# Patient Record
Sex: Female | Born: 1940 | Race: White | Hispanic: No | Smoking: Former smoker
Health system: Southern US, Community
[De-identification: ages and names within clinical notes are randomized; demographics above are authoritative.]

## PROBLEM LIST (undated history)

## (undated) DIAGNOSIS — E079 Disorder of thyroid, unspecified: Secondary | ICD-10-CM

## (undated) DIAGNOSIS — D510 Vitamin B12 deficiency anemia due to intrinsic factor deficiency: Secondary | ICD-10-CM

## (undated) DIAGNOSIS — Z860100 Personal history of colon polyps, unspecified: Secondary | ICD-10-CM

## (undated) DIAGNOSIS — E039 Hypothyroidism, unspecified: Secondary | ICD-10-CM

## (undated) DIAGNOSIS — F419 Anxiety disorder, unspecified: Secondary | ICD-10-CM

## (undated) DIAGNOSIS — E785 Hyperlipidemia, unspecified: Secondary | ICD-10-CM

## (undated) DIAGNOSIS — H9313 Tinnitus, bilateral: Secondary | ICD-10-CM

## (undated) DIAGNOSIS — M1990 Unspecified osteoarthritis, unspecified site: Secondary | ICD-10-CM

## (undated) DIAGNOSIS — I10 Essential (primary) hypertension: Secondary | ICD-10-CM

## (undated) DIAGNOSIS — Z9889 Other specified postprocedural states: Secondary | ICD-10-CM

## (undated) DIAGNOSIS — Z87891 Personal history of nicotine dependence: Secondary | ICD-10-CM

## (undated) HISTORY — DX: Anxiety disorder, unspecified: F41.9

## (undated) HISTORY — DX: Unspecified osteoarthritis, unspecified site: M19.90

## (undated) HISTORY — PX: APPENDECTOMY: SHX54

## (undated) HISTORY — DX: Hyperlipidemia, unspecified: E78.5

## (undated) HISTORY — DX: Vitamin B12 deficiency anemia due to intrinsic factor deficiency: D51.0

## (undated) HISTORY — DX: Tinnitus, bilateral: H93.13

## (undated) HISTORY — PX: COLONOSCOPY: SHX174

## (undated) HISTORY — DX: Essential (primary) hypertension: I10

## (undated) HISTORY — DX: Personal history of colon polyps, unspecified: Z86.0100

## (undated) HISTORY — DX: Personal history of nicotine dependence: Z87.891

## (undated) HISTORY — PX: ENTEROCELE REPAIR: SHX623

## (undated) HISTORY — DX: Personal history of colonic polyps: Z86.010

## (undated) HISTORY — DX: Disorder of thyroid, unspecified: E07.9

## (undated) HISTORY — DX: Hypothyroidism, unspecified: E03.9

## (undated) HISTORY — PX: ABDOMINAL HYSTERECTOMY: SHX81

## (undated) HISTORY — PX: POLYPECTOMY: SHX149

## (undated) HISTORY — PX: BREAST SURGERY: SHX581

## (undated) HISTORY — DX: Other specified postprocedural states: Z98.890

## (undated) HISTORY — PX: EXCISIONAL HEMORRHOIDECTOMY: SHX1541

---

## 1998-05-30 ENCOUNTER — Other Ambulatory Visit: Admission: RE | Admit: 1998-05-30 | Discharge: 1998-05-30 | Payer: Self-pay | Admitting: Gynecology

## 1999-04-25 ENCOUNTER — Other Ambulatory Visit: Admission: RE | Admit: 1999-04-25 | Discharge: 1999-04-25 | Payer: Self-pay | Admitting: Gynecology

## 2000-10-22 ENCOUNTER — Encounter: Admission: RE | Admit: 2000-10-22 | Discharge: 2000-10-22 | Payer: Self-pay | Admitting: Gynecology

## 2000-10-22 ENCOUNTER — Encounter: Payer: Self-pay | Admitting: Gynecology

## 2000-11-03 ENCOUNTER — Ambulatory Visit (HOSPITAL_COMMUNITY): Admission: RE | Admit: 2000-11-03 | Discharge: 2000-11-03 | Payer: Self-pay | Admitting: Gastroenterology

## 2000-11-03 ENCOUNTER — Encounter (INDEPENDENT_AMBULATORY_CARE_PROVIDER_SITE_OTHER): Payer: Self-pay | Admitting: *Deleted

## 2001-10-21 ENCOUNTER — Other Ambulatory Visit: Admission: RE | Admit: 2001-10-21 | Discharge: 2001-10-21 | Payer: Self-pay | Admitting: Gynecology

## 2003-02-23 ENCOUNTER — Other Ambulatory Visit: Admission: RE | Admit: 2003-02-23 | Discharge: 2003-02-23 | Payer: Self-pay | Admitting: Gynecology

## 2003-10-17 ENCOUNTER — Encounter: Admission: RE | Admit: 2003-10-17 | Discharge: 2003-10-17 | Payer: Self-pay | Admitting: Gynecology

## 2004-02-23 ENCOUNTER — Other Ambulatory Visit: Admission: RE | Admit: 2004-02-23 | Discharge: 2004-02-23 | Payer: Self-pay | Admitting: Gynecology

## 2004-10-03 ENCOUNTER — Ambulatory Visit (HOSPITAL_COMMUNITY): Admission: RE | Admit: 2004-10-03 | Discharge: 2004-10-03 | Payer: Self-pay | Admitting: Gastroenterology

## 2004-10-03 ENCOUNTER — Encounter (INDEPENDENT_AMBULATORY_CARE_PROVIDER_SITE_OTHER): Payer: Self-pay | Admitting: Specialist

## 2005-05-02 ENCOUNTER — Encounter: Admission: RE | Admit: 2005-05-02 | Discharge: 2005-05-02 | Payer: Self-pay | Admitting: Gynecology

## 2005-05-30 ENCOUNTER — Other Ambulatory Visit: Admission: RE | Admit: 2005-05-30 | Discharge: 2005-05-30 | Payer: Self-pay | Admitting: Gynecology

## 2005-11-07 ENCOUNTER — Ambulatory Visit (HOSPITAL_COMMUNITY): Admission: RE | Admit: 2005-11-07 | Discharge: 2005-11-08 | Payer: Self-pay | Admitting: General Surgery

## 2005-11-07 ENCOUNTER — Encounter (INDEPENDENT_AMBULATORY_CARE_PROVIDER_SITE_OTHER): Payer: Self-pay | Admitting: *Deleted

## 2006-06-02 ENCOUNTER — Other Ambulatory Visit: Admission: RE | Admit: 2006-06-02 | Discharge: 2006-06-02 | Payer: Self-pay | Admitting: Gynecology

## 2006-06-02 ENCOUNTER — Encounter: Admission: RE | Admit: 2006-06-02 | Discharge: 2006-06-02 | Payer: Self-pay | Admitting: Gynecology

## 2007-06-15 ENCOUNTER — Other Ambulatory Visit: Admission: RE | Admit: 2007-06-15 | Discharge: 2007-06-15 | Payer: Self-pay | Admitting: Gynecology

## 2007-06-15 ENCOUNTER — Encounter: Admission: RE | Admit: 2007-06-15 | Discharge: 2007-06-15 | Payer: Self-pay | Admitting: Gynecology

## 2008-06-15 ENCOUNTER — Encounter: Admission: RE | Admit: 2008-06-15 | Discharge: 2008-06-15 | Payer: Self-pay | Admitting: Gynecology

## 2009-06-16 ENCOUNTER — Encounter: Admission: RE | Admit: 2009-06-16 | Discharge: 2009-06-16 | Payer: Self-pay | Admitting: Gynecology

## 2010-07-03 ENCOUNTER — Encounter: Admission: RE | Admit: 2010-07-03 | Discharge: 2010-07-03 | Payer: Self-pay | Admitting: Gynecology

## 2010-10-31 ENCOUNTER — Ambulatory Visit (HOSPITAL_COMMUNITY)
Admission: RE | Admit: 2010-10-31 | Discharge: 2010-10-31 | Disposition: A | Discharge status: Home / Self Care | Payer: Medicare Other | Source: Ambulatory Visit | Attending: Family Medicine | Admitting: Family Medicine

## 2010-10-31 ENCOUNTER — Other Ambulatory Visit (HOSPITAL_COMMUNITY): Payer: Self-pay | Admitting: Family Medicine

## 2010-10-31 ENCOUNTER — Other Ambulatory Visit: Payer: Self-pay | Admitting: Family Medicine

## 2010-10-31 ENCOUNTER — Observation Stay (HOSPITAL_COMMUNITY)
Admission: EM | Admit: 2010-10-31 | Discharge: 2010-11-01 | Disposition: A | Discharge status: Home / Self Care | Payer: Medicare Other | Attending: Surgery | Admitting: Surgery

## 2010-10-31 ENCOUNTER — Emergency Department (HOSPITAL_COMMUNITY): Payer: Medicare Other

## 2010-10-31 DIAGNOSIS — Z23 Encounter for immunization: Secondary | ICD-10-CM | POA: Insufficient documentation

## 2010-10-31 DIAGNOSIS — R52 Pain, unspecified: Secondary | ICD-10-CM

## 2010-10-31 DIAGNOSIS — E039 Hypothyroidism, unspecified: Secondary | ICD-10-CM | POA: Insufficient documentation

## 2010-10-31 DIAGNOSIS — R112 Nausea with vomiting, unspecified: Secondary | ICD-10-CM | POA: Insufficient documentation

## 2010-10-31 DIAGNOSIS — R109 Unspecified abdominal pain: Secondary | ICD-10-CM | POA: Insufficient documentation

## 2010-10-31 DIAGNOSIS — K3580 Unspecified acute appendicitis: Principal | ICD-10-CM | POA: Insufficient documentation

## 2010-10-31 DIAGNOSIS — R110 Nausea: Secondary | ICD-10-CM | POA: Insufficient documentation

## 2010-10-31 DIAGNOSIS — E7800 Pure hypercholesterolemia, unspecified: Secondary | ICD-10-CM | POA: Insufficient documentation

## 2010-10-31 LAB — DIFFERENTIAL
Basophils Relative: 0 % (ref 0–1)
Eosinophils Absolute: 0.1 10*3/uL (ref 0.0–0.7)
Lymphs Abs: 1.3 10*3/uL (ref 0.7–4.0)

## 2010-10-31 LAB — CREATININE, SERUM: GFR calc Af Amer: >60 mL/min (ref >60)

## 2010-10-31 LAB — PROTIME-INR: Prothrombin Time: 13 seconds (ref 11.6–15.2)

## 2010-10-31 LAB — CBC
HCT: 41.4 % (ref 36.0–46.0)
MCH: 30.9 pg (ref 26.0–34.0)
WBC: 11.6 10*3/uL — ABNORMAL HIGH (ref 4.0–10.5)

## 2010-10-31 LAB — POCT I-STAT, CHEM 8: Sodium: 136 mEq/L (ref 135–145)

## 2010-10-31 MED ORDER — IOHEXOL 300 MG/ML  SOLN
100.0000 mL | Freq: Once | INTRAMUSCULAR | Status: AC | PRN
  Administered 2010-10-31: 100 mL via INTRAVENOUS

## 2010-11-01 ENCOUNTER — Other Ambulatory Visit: Payer: Self-pay | Admitting: General Surgery

## 2010-11-01 ENCOUNTER — Ambulatory Visit (HOSPITAL_COMMUNITY)
Admission: RE | Admit: 2010-11-01 | Discharge: 2010-11-01 | Disposition: A | Discharge status: Home / Self Care | Payer: PRIVATE HEALTH INSURANCE | Source: Ambulatory Visit | Attending: Family Medicine | Admitting: Family Medicine

## 2010-11-12 NOTE — H&P (Addendum)
[name], [name]                 ACCOUNT NO.:  [id number]  MEDICAL RECORD NO.:  [id number]           PATIENT TYPE:  I  LOCATION:  3039                         FACILITY:  [facility]  PHYSICIAN:  Lennie Muckle, MD      DATE OF BIRTH:  29-Oct-1940  DATE OF ADMISSION:  10/31/2010 DATE OF DISCHARGE:                             HISTORY & PHYSICAL   PRIMARY CARE PHYSICIAN:  Dr. Clarene Duke at [facility].  CHIEF COMPLAINT:  Abdominal pain.  HISTORY:  This is a 70 year old female who had onset abdominal pain last night.  Pain is generalized over the abdomen.  She states that unrelenting pain began to worsen today.  She went to their [facility] where she was sent for imaging.  She states that she did have some mild chills, but no documented fevers, nausea, and one episode of emesis.  No diarrhea.  On CT scan, there was by report perforation.  She was sent to the emergency department for further management.  She states the pain is worse with deep palpation.  It is better with pain medications.  PAST MEDICAL HISTORY: 1. Hyperthyroidism. 2. Hypercholesterolemia.  SURGICAL HISTORY:  Hysterectomy, lumpectomy.  FAMILY HISTORY:  Negative.  SOCIAL HISTORY:  She is married, lives with her husband, has 1 child. No tobacco or alcohol use.  REVIEW OF SYSTEMS:  Negative.  MEDICATION:  Synthroid, simvastatin, multivitamin.  ALLERGIES:  No allergies.  PHYSICAL EXAMINATION:  CONSTITUTIONAL:  She is lying on a stretcher in no acute distress.  She appears her stated age.  Alert and  oriented x3. VITAL SIGNS:  Temperature is 97.1, heart rate 76, blood pressure 142/77. HEENT:  Head is normocephalic.  Extraocular muscles are intact.  Pupils are equal and round.  Sclarea and conjunctivae are clear.  Nares are clear without drainage.  Oral mucosa is pink and moist.  Dentition is good.  Trachea is midline. NECK:  Thyroid without palpable abnormalities.  Normal range of motion in the  neck. CHEST:  Clear to auscultation bilaterally.  Normal expansion and excursion. CARDIOVASCULAR:  Regular rate and rhythm.  No murmurs, gallops, or rubs. EXTREMITIES:  Pulses are palpated in the upper extremities.  No edema in the lower extremity. ABDOMEN:  Mildly distended, mildly tender to palpation.  No peritoneal signs.  No organomegaly.  No masses.  No hernias.  Bowel sounds are hypoactive. MUSCULOSKELETAL:  No pain on palpation in joints.  Normal range of motion. SKIN:  No rashes.  Normal skin turgor and color.  Skin is warm to touch. PSYCHOLOGIC:  Normal.  LABORATORY DATA:  CBC:  White count 11.6, hemoglobin and hematocrit 14 and 41.4, platelets 161.  BUN creatinine 10 and 0.9.  CT scans reviewed. There is a thickened appendix with some fluid noted around the appendix. I reviewed the imaging with Dr. Collins Scotland, Radiology.  Despite the fluid, there is not an organized fluid collection, nor is there air suggesting perforation in the vicinity.  ASSESSMENT AND PLAN:  Acute appendicitis with fluid within the pelvis. We will plan laparoscopic appendectomy with wash out of the abdomen. She received Zosyn in the  emergency department.  I have discussed risks of surgery including but not limited to bleeding, infection, abscess formation, possibility of ileus, and conversion to an open procedure. We are going to proceed to the operating room and then hopefully, she will recover without incident and be discharged either tomorrow or over the next few days.     Lennie Muckle, MD     ALA/MEDQ  D:  10/31/2010  T:  11/01/2010  Job:  [id number]  Electronically Signed by Bertram Savin MD on 11/12/2010 01:27:16 PM

## 2010-11-12 NOTE — Op Note (Addendum)
[name], [name]                 ACCOUNT NO.:  [id number]  MEDICAL RECORD NO.:  [id number]          PATIENT TYPE:  LOCATION:                                 FACILITY:  PHYSICIAN:  Lennie Muckle, MD           DATE OF BIRTH:  DATE OF PROCEDURE:  11/01/2010 DATE OF DISCHARGE:                              OPERATIVE REPORT   PREOPERATIVE DIAGNOSIS:  Acute appendicitis.  POSTOPERATIVE DIAGNOSIS:  Acute appendicitis.  PROCEDURE:  Laparoscopic appendectomy.  SURGEON:  Lennie Muckle, MD  ASSISTANT:  OR staff.  FINDINGS:  Acute appendicitis.  Small amount of fluid within the pelvis. No immediate complications.  SPECIMENS:  Gallbladder.  INDICATIONS FOR PROCEDURE:  [name] is a 70 year old female who came in with a chief complaint of abdominal pain.  CT findings revealed acute appendicitis.  She received Zosyn in the emergency department.  DETAILS OF PROCEDURE:  [name] was taken from the emergency department to the preoperative holding area, seen by Anesthesia and then taken to the operating suite.  Once the operating suite, placed in supine position.  After administration of general endotracheal anesthesia, sequential compression devices applied to her lower extremity.  Abdomen was prepped and draped in usual sterile fashion.  Surgical time-out performed.  I began by placing a 5-mm trocar using the OptiVu into the abdominal cavity on the right side of the abdomen.  All layers of the abdominal wall were visualized upon entry.  I inspected the abdomen and found no evidence of any replacement of the trocar.  I then placed an additional 5-mm trocar at the umbilical region under visualization with camera.  The patient was placed right side upper and turned over position.  An 11-mm trocar was placed in the left lower quadrant under visualization with camera.  The appendix was visualized within the pelvis.  The cecum was somewhat down into the pelvis.  I was able to grasp the  appendix and retracted the abdominal wall.  There was small amount of purulent fluid within the pelvis.  There was no evidence of perforation.  I dissected the base of the appendix, stapled across the mesoappendix without difficulty.  I did mobilize the cecum laterally. This enabled me to get a laparoscopic load at the GIA stapler across the base of the appendix.  I then irrigated the abdomen.  There was small amount of bleeding from the mesoappendix.  I used a clip applier to control this.  I then continued irrigating the abdomen until the irrigant was clear.  There was no further bleeding from the staple line. The appendix was removed and an EndoCatch bag from the abdomen.  I did one more inspection of the abdomen and found the staple line intact and no bleeding.  I then removed and closed the fascial defect using a 0 Vicryl suture.  A 20 mL of 0.25% Marcaine were placed for local block.  The patient had skin closed with 4-0 Monocryl. Dermabond was placed for final dressing.  The patient was then extubated and transported to the Postanesthesia Care Unit in stable condition.  Lennie Muckle, MD     ALA/MEDQ  D:  11/01/2010  T:  11/01/2010  Job:  [id number]  cc:   Caryn Bee L. Little, M.D.  Electronically Signed by Bertram Savin MD on 11/12/2010 01:27:19 PM

## 2011-01-18 NOTE — Procedures (Signed)
[facility]  Patient:    [name], [name]                        MRN: [id number] Proc. Date: 11/03/00 Adm. Date:  [id number] Attending:  Rich Brave CC:         Gretta Cool, M.D.   Procedure Report  PROCEDURE:  Colonoscopy with polypectomy and biopsies.  INDICATION:  A 70 year old with possible polyp on digital exam.  FINDINGS:  Hypertrophied anal papilla at the dentate line, presumably corresponding to the palpable nodule in the rectum.  Three small polyps removed from the proximal colon.  DESCRIPTION OF PROCEDURE:  The nature, purpose, and risks of the procedure have been discussed with the patient, who provided written consent.  Sedation was fentanyl 40 mcg and Versed 4 mg IV without arrhythmias or desaturation. The Olympus pediatric adjustable-tension video colonoscope was advanced to the cecum without difficulty, as identified by clear visualization of the appendiceal orifice, and pullback was then performed.  The quality of the prep was excellent, and it is felt that all areas were well-seen.  There were three small polyps identified and removed during todays exam. There was a 4-5 mm semipedunculated sessile polyp near the hepatic flexure that was removed by snare technique with complete hemostasis and no evidence of excessive cautery.  It was able to suction through the scope with initial resistance.  It was retrieved for histologic analysis.  Distal to this, in the proximal transverse region, I encountered two other polyps.  One was a 2 x 4 mm sessile polyp on a fold, removed by three cold biopsies, and the other was a 2 mm sessile polyp removed by a single cold biopsy.  No large polyps, cancer, colitis, vascular malformations, or diverticulosis were observed.  Retroflexion in the rectum showed a prominent hypertrophied anal papilla, also observed during pullout through the anal canal.  The patient tolerated this  procedure well, and there were no apparent complications.  IMPRESSION: 1. Three small polyps, removed as described above. 2. Hypertrophied anal papilla, presumably corresponding to that nodule found    on Dr. Waymon Budge rectal exam.  No alternative lesion such as rectal polyps    were identified to account for that nodular lesion.  PLAN:  Await pathology on polyps.  Consider rectocele repair in view of difficulties with defecation. DD:  11/03/00 TD:  11/04/00 Job: [id number]

## 2011-01-18 NOTE — Op Note (Signed)
NAMESHARICKA, [name]                 ACCOUNT NO.:  [id number]   MEDICAL RECORD NO.:  [id number]          PATIENT TYPE:  AMB   LOCATION:  ENDO                         FACILITY:  [facility]   PHYSICIAN:  Bernette Redbird, M.D.   DATE OF BIRTH:  21-Jul-1941   DATE OF PROCEDURE:  10/03/2004  DATE OF DISCHARGE:                                 OPERATIVE REPORT   PROCEDURE:  Colonoscopy with biopsy.   INDICATIONS FOR PROCEDURE:  70 year old female for follow-up of two small  colonic adenomas removed colonoscopically about 4 years ago.   FINDINGS:  Two diminutive polyps removed from the descending colon.   PROCEDURE:  The nature, purpose, risks of the procedure were familiar to the  patient from prior examination and she provided written consent. Sedation  was fentanyl 75 mcg and Versed 7.5 milligrams IV without arrhythmias or  desaturation.   The Olympus adjustable tension pediatric video colonoscope was advanced to  the cecum as identified by clear visualization of the appendiceal orifice  and pullback was then performed. The quality of prep was excellent and it is  felt that all areas were well seen.   This was exam that was pertinent for the presence of two small diminutive  roughly 2-3 mm sessile hyperplastic appearing polyps in the proximal colon,  one just above the cecum, the other in the proximal to midascending colon,  each removed by one or more cold biopsies.   No large polyps, cancer, colitis, vascular malformations or diverticulosis  were noted. Retroflexion in the rectum and reinspection of the rectum were  unremarkable. The patient tolerated the procedure well and there were no  apparent complications.   IMPRESSION:  1.  Diminutive colon polyps removed as described above (16109).  2.  Prior history of colonic adenomas.   PLAN:  Await pathology. Anticipate of colonoscopic follow-up in 5 years, in  view of the prior history of colonic adenomas having been removed.      RB/MEDQ  D:  10/03/2004  T:  10/03/2004  Job:  [id number]   cc:   Aida Puffer  [address]  [place]  [place] [address]  Fax: [phone]   Gretta Cool, M.D.  [address] [place]  [place] [address]  Fax: [phone]

## 2011-01-18 NOTE — Op Note (Signed)
NAMECHARESE, ABUNDIS                 ACCOUNT NO.:  [id number]   MEDICAL RECORD NO.:  [id number]          PATIENT TYPE:  AMB   LOCATION:  DAY                          FACILITY:  [facility]   PHYSICIAN:  Timothy E. Earlene Plater, M.D. DATE OF BIRTH:  Nov 02, 1940   DATE OF PROCEDURE:  11/07/2005  DATE OF DISCHARGE:                                 OPERATIVE REPORT   PREOPERATIVE DIAGNOSES:  Prolapsed hemorrhoids.   POSTOPERATIVE DIAGNOSES:  Prolapsed hemorrhoids.   PROCEDURE:  PPH hemorrhoidectomy.   SURGEON:  Timothy E. Earlene Plater, M.D.   ANESTHESIA:  General.   Ms. 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  Colonoscopy is otherwise negative. After careful discussion and thought, she  wishes to proceed with this procedure as has been carefully explained.   The patient was taken to the operating room, placed supine, general  endotracheal anesthesia administered. She was placed in lithotomy, carefully  positioned and then prepped and draped in the usual fashion. The pelvis  appeared to be relaxed with descensus of the perineum. There was prolapse of  the right posterior hemorrhoidal complex. Anoscopy carried out and the  rectal mucosa prepped with Betadine. The operating anoscope inserted after  dilatation. Also Marcaine 0.25% with epinephrine mixed 30:1 with Wydase was  injected around and about the ankle orifice and the pelvis for prolonged  anesthesia. Through the operating anoscope, a pursestring suture of 2-0  Prolene was placed approximately 3-4 cm from the dentate line. To note,  there was some arterial bleeding in the right posterior position, pressure  was held.   Then the [facility] anoscope was inserted, the sutures were brought through the  center of that scope and then the pursestring suture was checked with the  examining finger. It was intact circumferentially. Then the open PPH  stapling device was carefully throughout the anoscope and the head of the  PPH stapling device was gently placed above the pursestring suture. When  properly positioned, the pursestring suture was tied around the post of the  stapling device and then holding the stapling device in the appropriate line  of fire and orientation that stapling device was closed, fired, opened and  removed. The tissue examined was a complete ring of rectal mucosa. The  operative area over the next 20 minutes was carefully checked. 4-0 Vicryl  sutures were placed in the right posterior position and though there was no  arterial bleeding, there was some oozing. This was stopped also in the left  lateral position, two sutures of 4-0 Vicryl were placed. Irrigation was  carried out, all areas were carefully checked and there was no further  problem. Gelfoam gauze and a dry sterile dressing applied. She tolerated it  well, counts correct. She was removed to the recovery room in good  condition.   Written and verbal instructions were given to her and her family. Because  she lives more than an hour out of [place], she will be observed  overnight.      Marcial Pacas  Celedonio Savage, M.D.  Electronically Signed     TED/MEDQ  D:  11/07/2005  T:  11/08/2005  Job:  [id number]   cc:   Bernette Redbird, M.D.  Fax: [phone]

## 2011-03-18 ENCOUNTER — Other Ambulatory Visit: Payer: Self-pay | Admitting: Dermatology

## 2011-06-11 ENCOUNTER — Other Ambulatory Visit: Payer: Self-pay | Admitting: Gynecology

## 2011-06-11 DIAGNOSIS — Z1231 Encounter for screening mammogram for malignant neoplasm of breast: Secondary | ICD-10-CM

## 2011-07-05 ENCOUNTER — Ambulatory Visit
Admission: RE | Admit: 2011-07-05 | Discharge: 2011-07-05 | Disposition: A | Discharge status: Home / Self Care | Payer: Medicare Other | Source: Ambulatory Visit | Attending: Gynecology | Admitting: Gynecology

## 2011-07-05 DIAGNOSIS — Z1231 Encounter for screening mammogram for malignant neoplasm of breast: Secondary | ICD-10-CM

## 2011-07-12 LAB — HM PAP SMEAR: HM Pap smear: NORMAL

## 2011-07-12 LAB — HM MAMMOGRAPHY: HM Mammogram: NEGATIVE

## 2011-12-10 ENCOUNTER — Encounter: Payer: Self-pay | Admitting: Internal Medicine

## 2011-12-10 ENCOUNTER — Ambulatory Visit (INDEPENDENT_AMBULATORY_CARE_PROVIDER_SITE_OTHER): Payer: Medicare HMO | Admitting: Internal Medicine

## 2011-12-10 VITALS — BP 100/70 | HR 66 | Temp 98.1°F | Resp 18 | Ht 66.0 in | Wt 141.0 lb

## 2011-12-10 DIAGNOSIS — E7800 Pure hypercholesterolemia, unspecified: Secondary | ICD-10-CM | POA: Insufficient documentation

## 2011-12-10 DIAGNOSIS — Z86010 Personal history of colonic polyps: Secondary | ICD-10-CM

## 2011-12-10 DIAGNOSIS — Z0000 Encounter for general adult medical examination without abnormal findings: Secondary | ICD-10-CM

## 2011-12-10 DIAGNOSIS — E039 Hypothyroidism, unspecified: Secondary | ICD-10-CM

## 2011-12-10 LAB — CBC WITH DIFFERENTIAL/PLATELET
Basophils Absolute: 0 10*3/uL (ref 0.0–0.1)
Basophils Relative: 0.6 % (ref 0.0–3.0)
Eosinophils Absolute: 0.1 10*3/uL (ref 0.0–0.7)
Eosinophils Relative: 1.7 % (ref 0.0–5.0)
HCT: 42 % (ref 36.0–46.0)
Hemoglobin: 14.1 g/dL (ref 12.0–15.0)
Lymphocytes Relative: 30.4 % (ref 12.0–46.0)
Lymphs Abs: 1.8 10*3/uL (ref 0.7–4.0)
MCHC: 33.6 g/dL (ref 30.0–36.0)
MCV: 92.3 fl (ref 78.0–100.0)
Monocytes Absolute: 0.5 10*3/uL (ref 0.1–1.0)
Monocytes Relative: 7.7 % (ref 3.0–12.0)
Neutro Abs: 3.6 10*3/uL (ref 1.4–7.7)
Neutrophils Relative %: 59.6 % (ref 43.0–77.0)
Platelets: 176 10*3/uL (ref 150.0–400.0)
RBC: 4.55 Mil/uL (ref 3.87–5.11)
RDW: 12.5 % (ref 11.5–14.6)
WBC: 6.1 10*3/uL (ref 4.5–10.5)

## 2011-12-10 LAB — COMPREHENSIVE METABOLIC PANEL
ALT: 17 U/L (ref 0–35)
AST: 21 U/L (ref 0–37)
Calcium: 9.2 mg/dL (ref 8.4–10.5)
Chloride: 102 mEq/L (ref 96–112)
Creatinine, Ser: 1 mg/dL (ref 0.4–1.2)
Total Bilirubin: 0.5 mg/dL (ref 0.3–1.2)

## 2011-12-10 LAB — TSH: TSH: 0.56 u[IU]/mL (ref 0.35–5.50)

## 2011-12-10 LAB — CHOLESTEROL, TOTAL: Cholesterol: 151 mg/dL (ref 0–200)

## 2011-12-10 MED ORDER — LEVOTHYROXINE SODIUM 75 MCG PO TABS: 75.0000 ug | ORAL_TABLET | Freq: Every day | ORAL | Status: DC

## 2011-12-10 MED ORDER — SIMVASTATIN 20 MG PO TABS: 20.0000 mg | ORAL_TABLET | Freq: Every day | ORAL | Status: DC

## 2011-12-10 MED ORDER — ALPRAZOLAM 0.5 MG PO TABS: 0.5000 mg | ORAL_TABLET | Freq: Every day | ORAL | Status: DC | PRN

## 2011-12-10 NOTE — Progress Notes (Signed)
Subjective:    Patient ID: [name], female    DOB: 10-20-1940, 71 y.o.   MRN: [id number]  HPI  71 year old patient who is seen today to establish with our practice. Medical problems include hypothyroidism. She is treated hypercholesterolemia and history of colonic polyps. Her last colonoscopy was in 2010. Family history is pertinent for a strong family history of colon cancer. Her mother brother and son have all had colon cancer. Son died at age 62 of complications of colon cancer Social history married 4 children number of grandchildren nonsmoker retired Family history father died at 59 cup of cases of congestive heart failure Mother died 64 complications from a pulmonary embolism she had history of colon cancer One brother survivor colon cancer One son died of complications of colon cancer 5 brothers 2  sisters positive for coronary artery disease  Past Medical History  Diagnosis Date  . Hyperlipidemia   . Hx of colonoscopy with polypectomy   . Thyroid disease     History   Social History  . Marital Status: Married    Spouse Name: N/A    Number of Children: N/A  . Years of Education: N/A   Occupational History  . Not on file.   Social History Main Topics  . Smoking status: Never Smoker   . Smokeless tobacco: Never Used  . Alcohol Use: No  . Drug Use: No  . Sexually Active: Not on file   Other Topics Concern  . Not on file   Social History Narrative  . No narrative on file    Past Surgical History  Procedure Date  . Appendectomy   . Abdominal hysterectomy   . Breast surgery     bx    Family History  Problem Relation Age of Onset  . Cancer Mother     colon  . Heart disease Father     No Known Allergies  Current Outpatient Prescriptions on File Prior to Visit  Medication Sig Dispense Refill  . calcium carbonate (OS-CAL) 600 MG TABS Take 600 mg by mouth daily.      . simvastatin (ZOCOR) 20 MG tablet Take 20 mg by mouth daily.         BP 100/70   Pulse 66  Temp(Src) 98.1 F (36.7 C) (Oral)  Resp 18  Ht 5\' 6"  (1.676 m)  Wt 141 lb (63.957 kg)  BMI 22.76 kg/m2  SpO2 98%   1. Risk factors, based on past  M,S,F history  cardiovascular risk factors include dyslipidemia as well as a family history of coronary artery disease  2.  Physical activities: Remains quite active without exercise limitations  3.  Depression/mood: No history depression normal disorder 4.  Hearing: No significant deficits  5.  ADL's: Independent in all aspects of daily living 6.  Fall risk: Low  7.  Home safety: No problems identified  8.  Height weight, and visual acuity; height and weight stable no change in visual acuity  9.  Counseling: Heart healthy diet regular exercise encouraged  10. Lab orders based on risk factors: Laboratory update will be performed 11. Referral : Followup colonoscopy in 2015  12. Care plan: Colonoscopy 2015 heart healthy diet regular exercise encouraged we'll continue statin therapy  13. Cognitive assessment: Alert and oriented with normal affect. No cognitive dysfunction.     Review of Systems  Constitutional: Negative for fever, appetite change, fatigue and unexpected weight change.  HENT: Negative for hearing loss, ear pain, nosebleeds, congestion, sore throat, mouth  sores, trouble swallowing, neck stiffness, dental problem, voice change, sinus pressure and tinnitus.   Eyes: Negative for photophobia, pain, redness and visual disturbance.  Respiratory: Negative for cough, chest tightness and shortness of breath.   Cardiovascular: Negative for chest pain, palpitations and leg swelling.  Gastrointestinal: Negative for nausea, vomiting, abdominal pain, diarrhea, constipation, blood in stool, abdominal distention and rectal pain.  Genitourinary: Negative for dysuria, urgency, frequency, hematuria, flank pain, vaginal bleeding, vaginal discharge, difficulty urinating, genital sores, vaginal pain, menstrual problem and pelvic  pain.  Musculoskeletal: Negative for back pain and arthralgias.  Skin: Negative for rash.  Neurological: Negative for dizziness, syncope, speech difficulty, weakness, light-headedness, numbness and headaches.  Hematological: Negative for adenopathy. Does not bruise/bleed easily.  Psychiatric/Behavioral: Negative for suicidal ideas, behavioral problems, self-injury, dysphoric mood and agitation. The patient is not nervous/anxious.        Objective:   Physical Exam  Constitutional: She is oriented to person, place, and time. She appears well-developed and well-nourished.  HENT:  Head: Normocephalic and atraumatic.  Right Ear: External ear normal.  Left Ear: External ear normal.  Mouth/Throat: Oropharynx is clear and moist.  Eyes: Conjunctivae and EOM are normal.  Neck: Normal range of motion. Neck supple. No JVD present. No thyromegaly present.  Cardiovascular: Normal rate, regular rhythm, normal heart sounds and intact distal pulses.   No murmur heard. Pulmonary/Chest: Effort normal and breath sounds normal. She has no wheezes. She has no rales.  Abdominal: Soft. Bowel sounds are normal. She exhibits no distension and no mass. There is no tenderness. There is no rebound and no guarding.  Musculoskeletal: Normal range of motion. She exhibits no edema and no tenderness.  Neurological: She is alert and oriented to person, place, and time. She has normal reflexes. No cranial nerve deficit. She exhibits normal muscle tone. Coordination normal.  Skin: Skin is warm and dry. No rash noted.  Psychiatric: She has a normal mood and affect. Her behavior is normal.          Assessment & Plan:   Preventive health exam Hypercholesterolemia. We'll continue simvastatin 40 mg daily Hypothyroidism. We'll check a TSH  Recheck in one year or as needed

## 2011-12-10 NOTE — Patient Instructions (Signed)
Limit your sodium (Salt) intake    It is important that you exercise regularly, at least 20 minutes 3 to 4 times per week.  If you develop chest pain or shortness of breath seek  medical attention.  Take a calcium supplement, plus 800-  Return in one year for follow-up 1200 units of vitamin D

## 2012-04-02 ENCOUNTER — Encounter: Payer: Self-pay | Admitting: Internal Medicine

## 2012-04-02 ENCOUNTER — Ambulatory Visit (INDEPENDENT_AMBULATORY_CARE_PROVIDER_SITE_OTHER): Payer: Medicare HMO | Admitting: Internal Medicine

## 2012-04-02 VITALS — BP 110/72 | Temp 98.6°F | Wt 142.0 lb

## 2012-04-02 DIAGNOSIS — R5383 Other fatigue: Secondary | ICD-10-CM

## 2012-04-02 DIAGNOSIS — R300 Dysuria: Secondary | ICD-10-CM

## 2012-04-02 DIAGNOSIS — E039 Hypothyroidism, unspecified: Secondary | ICD-10-CM

## 2012-04-02 LAB — POCT URINALYSIS DIPSTICK
Protein, UA: NEGATIVE
Spec Grav, UA: 1.01
Urobilinogen, UA: 0.2

## 2012-04-02 MED ORDER — CIPROFLOXACIN HCL 500 MG PO TABS: 500.0000 mg | ORAL_TABLET | Freq: Two times a day (BID) | ORAL | Status: AC

## 2012-04-02 NOTE — Progress Notes (Signed)
  Subjective:    Patient ID: [name], female    DOB: 12-29-1940, 71 y.o.   MRN: [id number]  HPI  71 year old patient who is seen today for followup. She presents with a one to two-week history of increasing fatigue. She's also noticed some low back pain urinary frequency urgency and at times cloudy urine. A UA was reviewed that revealed some pyuria and hematuria No fever chills flank pain  She does have a history of treated hypothyroidism    Review of Systems  Constitutional: Negative.   HENT: Negative for hearing loss, congestion, sore throat, rhinorrhea, dental problem, sinus pressure and tinnitus.   Eyes: Negative for pain, discharge and visual disturbance.  Respiratory: Negative for cough and shortness of breath.   Cardiovascular: Negative for chest pain, palpitations and leg swelling.  Gastrointestinal: Negative for nausea, vomiting, abdominal pain, diarrhea, constipation, blood in stool and abdominal distention.  Genitourinary: Positive for dysuria, urgency and frequency. Negative for hematuria, flank pain, vaginal bleeding, vaginal discharge, difficulty urinating, vaginal pain and pelvic pain.  Musculoskeletal: Negative for joint swelling, arthralgias and gait problem.  Skin: Negative for rash.  Neurological: Negative for dizziness, syncope, speech difficulty, weakness, numbness and headaches.  Hematological: Negative for adenopathy.  Psychiatric/Behavioral: Negative for behavioral problems, dysphoric mood and agitation. The patient is not nervous/anxious.        Objective:   Physical Exam  Constitutional: She is oriented to person, place, and time. She appears well-developed and well-nourished.  HENT:  Head: Normocephalic.  Right Ear: External ear normal.  Left Ear: External ear normal.  Mouth/Throat: Oropharynx is clear and moist.  Eyes: Conjunctivae and EOM are normal. Pupils are equal, round, and reactive to light.  Neck: Normal range of motion. Neck supple. No  thyromegaly present.  Cardiovascular: Normal rate, regular rhythm, normal heart sounds and intact distal pulses.   Pulmonary/Chest: Effort normal and breath sounds normal.  Abdominal: Soft. Bowel sounds are normal. She exhibits no mass. There is no tenderness.  Musculoskeletal: Normal range of motion.  Lymphadenopathy:    She has no cervical adenopathy.  Neurological: She is alert and oriented to person, place, and time.  Skin: Skin is warm and dry. No rash noted.  Psychiatric: She has a normal mood and affect. Her behavior is normal.          Assessment & Plan:   UTI. Will treat with Cipro for 7 days Fatigue probably second #1 Hypothyroidism

## 2012-04-02 NOTE — Patient Instructions (Addendum)
Take your antibiotic as prescribed until ALL of it is gone, but stop if you develop a rash, swelling, or any side effects of the medication.  Contact our office as soon as possible if  there are side effects of the medication.  Call or return to clinic prn if these symptoms worsen or fail to improve as anticipated. Urinary Tract Infection Infections of the urinary tract can start in several places. A bladder infection (cystitis), a kidney infection (pyelonephritis), and a prostate infection (prostatitis) are different types of urinary tract infections (UTIs). They usually get better if treated with medicines (antibiotics) that kill germs. Take all the medicine until it is gone. You or your child may feel better in a few days, but TAKE ALL MEDICINE or the infection may not respond and may become more difficult to treat. HOME CARE INSTRUCTIONS    Drink enough water and fluids to keep the urine clear or pale yellow. Cranberry juice is especially recommended, in addition to large amounts of water.   Avoid caffeine, tea, and carbonated beverages. They tend to irritate the bladder.   Alcohol may irritate the prostate.   Only take over-the-counter or prescription medicines for pain, discomfort, or fever as directed by your caregiver.  To prevent further infections:  Empty the bladder often. Avoid holding urine for long periods of time.   After a bowel movement, women should cleanse from front to back. Use each tissue only once.   Empty the bladder before and after sexual intercourse.  FINDING OUT THE RESULTS OF YOUR TEST Not all test results are available during your visit. If your or your child's test results are not back during the visit, make an appointment with your caregiver to find out the results. Do not assume everything is normal if you have not heard from your caregiver or the medical facility. It is important for you to follow up on all test results. SEEK MEDICAL CARE IF:    There is  back pain.   Your baby is older than 3 months with a rectal temperature of 100.5 F (38.1 C) or higher for more than 1 day.   Your or your child's problems (symptoms) are no better in 3 days. Return sooner if you or your child is getting worse.  SEEK IMMEDIATE MEDICAL CARE IF:    There is severe back pain or lower abdominal pain.   You or your child develops chills.   You have a fever.   Your baby is older than 3 months with a rectal temperature of 102 F (38.9 C) or higher.   Your baby is 78 months old or younger with a rectal temperature of 100.4 F (38 C) or higher.   There is nausea or vomiting.   There is continued burning or discomfort with urination.  MAKE SURE YOU:    Understand these instructions.   Will watch your condition.   Will get help right away if you are not doing well or get worse.  Document Released: 05/29/2005 Document Revised: 08/08/2011 Document Reviewed: 01/01/2007 [facility] Patient Information 2012 [place], [place].

## 2012-06-08 ENCOUNTER — Telehealth: Payer: Self-pay | Admitting: Internal Medicine

## 2012-06-08 NOTE — Telephone Encounter (Signed)
Caller: Seaira/Patient; Patient Name: [name]; PCP: Eleonore Chiquito [facility]); Best Callback Phone Number: [phone]; Call regarding intermittent Headaches, onset 10-5.  Afebrile.  Headache protocol used, see in 4 hours, due to severe headache unrelieved with 4 hours home care.  Advised Pt to be seen at Urgent care, Pt would rather be seen at office, appointment made on 10-8 at 930 with Dr Clent Ridges, Dr Amador Cunas had no availablity.  Pt verbalized understanding.

## 2012-06-09 ENCOUNTER — Encounter: Payer: Self-pay | Admitting: Family Medicine

## 2012-06-09 ENCOUNTER — Ambulatory Visit (INDEPENDENT_AMBULATORY_CARE_PROVIDER_SITE_OTHER): Payer: Medicare HMO | Admitting: Family Medicine

## 2012-06-09 ENCOUNTER — Ambulatory Visit (INDEPENDENT_AMBULATORY_CARE_PROVIDER_SITE_OTHER)
Admission: RE | Admit: 2012-06-09 | Discharge: 2012-06-09 | Disposition: A | Discharge status: Home / Self Care | Payer: Medicare HMO | Source: Ambulatory Visit | Attending: Family Medicine | Admitting: Family Medicine

## 2012-06-09 VITALS — BP 108/74 | HR 82 | Temp 98.5°F | Wt 146.0 lb

## 2012-06-09 DIAGNOSIS — R51 Headache: Secondary | ICD-10-CM

## 2012-06-09 MED ORDER — METHYLPREDNISOLONE ACETATE 80 MG/ML IJ SUSP
120.0000 mg | Freq: Once | INTRAMUSCULAR | Status: AC
  Administered 2012-06-09: 120 mg via INTRAMUSCULAR

## 2012-06-09 NOTE — Progress Notes (Signed)
  Subjective:    Patient ID: [name], female    DOB: 1941-08-01, 71 y.o.   MRN: [id number]  HPI Here for 4 days of HAs on the left side of the head. She has never had HAs before. These are sharp, fairly severe, and they last 30-40 seconds before disappearing. These have been coming off and on for 4 days. As we speak, she feels fine. No vision changes, no dizziness, no neurologic deficits. She was nauseated on one occasion but not usually. Ibuprofen helps quite a bit.    Review of Systems  Constitutional: Negative.   Neurological: Positive for headaches. Negative for dizziness, tremors, seizures, syncope, facial asymmetry, speech difficulty, weakness, light-headedness and numbness.       Objective:   Physical Exam  Constitutional: She is oriented to person, place, and time. She appears well-developed and well-nourished. No distress.  HENT:  Head: Normocephalic and atraumatic.  Right Ear: External ear normal.  Left Ear: External ear normal.  Nose: Nose normal.  Mouth/Throat: Oropharynx is clear and moist.  Eyes: Conjunctivae normal and EOM are normal. Pupils are equal, round, and reactive to light.       No photophobia   Neck: Neck supple. No thyromegaly present.  Lymphadenopathy:    She has no cervical adenopathy.  Neurological: She is alert and oriented to person, place, and time. She has normal reflexes. No cranial nerve deficit. She exhibits normal muscle tone. Coordination normal.          Assessment & Plan:  Possible trigeminal neuralgia. Given a steroid shot. She can use Ibuprofen prn. Get a head CT today to rule out structural issues.

## 2012-06-09 NOTE — Addendum Note (Signed)
Addended by: Aniceto Boss A on: 06/09/2012 10:49 AM   Modules accepted: Orders

## 2012-06-09 NOTE — Progress Notes (Signed)
Quick Note:  I left voice message with results. ______ 

## 2012-06-18 ENCOUNTER — Other Ambulatory Visit: Payer: Self-pay | Admitting: Gynecology

## 2012-06-18 DIAGNOSIS — Z1231 Encounter for screening mammogram for malignant neoplasm of breast: Secondary | ICD-10-CM

## 2012-06-24 ENCOUNTER — Other Ambulatory Visit: Payer: Self-pay | Admitting: Internal Medicine

## 2012-07-20 ENCOUNTER — Ambulatory Visit
Admission: RE | Admit: 2012-07-20 | Discharge: 2012-07-20 | Disposition: A | Discharge status: Home / Self Care | Payer: Medicare HMO | Source: Ambulatory Visit | Attending: Gynecology | Admitting: Gynecology

## 2012-07-20 DIAGNOSIS — Z1231 Encounter for screening mammogram for malignant neoplasm of breast: Secondary | ICD-10-CM

## 2012-11-10 ENCOUNTER — Other Ambulatory Visit: Payer: Self-pay | Admitting: Internal Medicine

## 2012-12-02 ENCOUNTER — Ambulatory Visit (INDEPENDENT_AMBULATORY_CARE_PROVIDER_SITE_OTHER): Payer: Medicare HMO | Admitting: Internal Medicine

## 2012-12-02 ENCOUNTER — Encounter: Payer: Self-pay | Admitting: Internal Medicine

## 2012-12-02 VITALS — BP 130/76 | HR 83 | Temp 98.6°F | Resp 20 | Wt 146.0 lb

## 2012-12-02 DIAGNOSIS — E7800 Pure hypercholesterolemia, unspecified: Secondary | ICD-10-CM

## 2012-12-02 DIAGNOSIS — J069 Acute upper respiratory infection, unspecified: Secondary | ICD-10-CM

## 2012-12-02 DIAGNOSIS — E039 Hypothyroidism, unspecified: Secondary | ICD-10-CM

## 2012-12-02 NOTE — Progress Notes (Signed)
Subjective:    Patient ID: [name], female    DOB: 07/18/1941, 72 y.o.   MRN: [id number]  HPI  72 year old patient who presents with a several-day history of URI symptoms with cough congestion headache and some myalgias. No fever or significant sputum production. No chest pain wheezing or shortness of breath.  Past Medical History  Diagnosis Date  . Hyperlipidemia   . Hx of colonoscopy with polypectomy   . Thyroid disease     History   Social History  . Marital Status: Married    Spouse Name: N/A    Number of Children: N/A  . Years of Education: N/A   Occupational History  . Not on file.   Social History Main Topics  . Smoking status: Never Smoker   . Smokeless tobacco: Never Used  . Alcohol Use: No  . Drug Use: No  . Sexually Active: Not on file   Other Topics Concern  . Not on file   Social History Narrative  . No narrative on file    Past Surgical History  Procedure Laterality Date  . Appendectomy    . Abdominal hysterectomy    . Breast surgery      bx    Family History  Problem Relation Age of Onset  . Cancer Mother     colon  . Heart disease Father     No Known Allergies  Current Outpatient Prescriptions on File Prior to Visit  Medication Sig Dispense Refill  . ALPRAZolam (XANAX) 0.5 MG tablet TAKE ONE TABLET BY MOUTH EVERY DAY AS NEEDED  60 tablet  2  . aspirin 81 MG tablet Take 81 mg by mouth daily.      . calcium carbonate (OS-CAL) 600 MG TABS Take 600 mg by mouth daily.      . Cholecalciferol (VITAMIN D-3 PO) Take by mouth daily.      Marland Kitchen levothyroxine (SYNTHROID, LEVOTHROID) 75 MCG tablet Take 1 tablet (75 mcg total) by mouth daily.  90 tablet  6  . magnesium 30 MG tablet Take by mouth daily.      Marland Kitchen OVER THE COUNTER MEDICATION OTC SLEEP AID      . simvastatin (ZOCOR) 20 MG tablet Take 1 tablet (20 mg total) by mouth daily.  90 tablet  6   No current facility-administered medications on file prior to visit.    BP 130/76  Pulse 83   Temp(Src) 98.6 F (37 C) (Oral)  Resp 20  Wt 146 lb (66.225 kg)  BMI 23.58 kg/m2  SpO2 98%       Review of Systems  Constitutional: Negative.   HENT: Positive for congestion and rhinorrhea. Negative for hearing loss, sore throat, dental problem, sinus pressure and tinnitus.   Eyes: Negative for pain, discharge and visual disturbance.  Respiratory: Positive for cough. Negative for shortness of breath.   Cardiovascular: Negative for chest pain, palpitations and leg swelling.  Gastrointestinal: Negative for nausea, vomiting, abdominal pain, diarrhea, constipation, blood in stool and abdominal distention.  Genitourinary: Negative for dysuria, urgency, frequency, hematuria, flank pain, vaginal bleeding, vaginal discharge, difficulty urinating, vaginal pain and pelvic pain.  Musculoskeletal: Positive for myalgias. Negative for joint swelling, arthralgias and gait problem.  Skin: Negative for rash.  Neurological: Negative for dizziness, syncope, speech difficulty, weakness, numbness and headaches.  Hematological: Negative for adenopathy.  Psychiatric/Behavioral: Negative for behavioral problems, dysphoric mood and agitation. The patient is not nervous/anxious.        Objective:   Physical Exam  Constitutional: She is oriented to person, place, and time. She appears well-developed and well-nourished.  HENT:  Head: Normocephalic.  Right Ear: External ear normal.  Left Ear: External ear normal.  Mouth/Throat: Oropharynx is clear and moist.  Eyes: Conjunctivae and EOM are normal. Pupils are equal, round, and reactive to light.  Neck: Normal range of motion. Neck supple. No thyromegaly present.  Cardiovascular: Normal rate, regular rhythm, normal heart sounds and intact distal pulses.   Pulmonary/Chest: Effort normal and breath sounds normal.  Abdominal: Soft. Bowel sounds are normal. She exhibits no mass. There is no tenderness.  Musculoskeletal: Normal range of motion.   Lymphadenopathy:    She has no cervical adenopathy.  Neurological: She is alert and oriented to person, place, and time.  Skin: Skin is warm and dry. No rash noted.  Psychiatric: She has a normal mood and affect. Her behavior is normal.          Assessment & Plan:   Viral URI with cough. We'll treat symptomatically Hypothyroidism Dyslipidemia   CPX in 3 months

## 2012-12-02 NOTE — Patient Instructions (Signed)
Acute bronchitis symptoms for less than 10 days are generally not helped by antibiotics.  Take over-the-counter expectorants and cough medications such as  Mucinex DM.  Call if there is no improvement in 5 to 7 days or if he developed worsening cough, fever, or new symptoms, such as shortness of breath or chest pain.  Return in 3 months for follow-up  

## 2012-12-21 ENCOUNTER — Telehealth: Payer: Self-pay | Admitting: Internal Medicine

## 2012-12-21 NOTE — Telephone Encounter (Signed)
Pt would like to request her meds be sent to  [facility] from now on.

## 2012-12-21 NOTE — Telephone Encounter (Signed)
Pharmacy changed

## 2013-02-15 ENCOUNTER — Ambulatory Visit (INDEPENDENT_AMBULATORY_CARE_PROVIDER_SITE_OTHER): Payer: Medicare HMO | Admitting: Internal Medicine

## 2013-02-15 ENCOUNTER — Encounter: Payer: Self-pay | Admitting: Internal Medicine

## 2013-02-15 VITALS — BP 126/80 | HR 66 | Temp 98.3°F | Resp 18 | Wt 146.0 lb

## 2013-02-15 DIAGNOSIS — E7800 Pure hypercholesterolemia, unspecified: Secondary | ICD-10-CM

## 2013-02-15 DIAGNOSIS — E039 Hypothyroidism, unspecified: Secondary | ICD-10-CM

## 2013-02-15 DIAGNOSIS — Z86010 Personal history of colonic polyps: Secondary | ICD-10-CM

## 2013-02-15 MED ORDER — SIMVASTATIN 20 MG PO TABS: 20.0000 mg | ORAL_TABLET | Freq: Every day | ORAL | Status: DC

## 2013-02-15 MED ORDER — ALPRAZOLAM 0.5 MG PO TABS: ORAL_TABLET | ORAL | Status: DC

## 2013-02-15 MED ORDER — LEVOTHYROXINE SODIUM 75 MCG PO TABS: 75.0000 ug | ORAL_TABLET | Freq: Every day | ORAL | Status: DC

## 2013-02-15 MED ORDER — ESTRADIOL 0.1 MG/GM VA CREA: 0.5000 g | TOPICAL_CREAM | VAGINAL | Status: DC

## 2013-02-15 NOTE — Patient Instructions (Signed)
Return in 6 months for follow-up    It is important that you exercise regularly, at least 20 minutes 3 to 4 times per week.  If you develop chest pain or shortness of breath seek  medical attention.  Take a calcium supplement, plus [phone] units of vitamin D

## 2013-02-15 NOTE — Progress Notes (Signed)
Subjective:    Patient ID: [name], female    DOB: Jul 17, 1941, 72 y.o.   MRN: [id number]  HPI   72 year old patient who is seen today for followup. Medical problems include hypothyroidism dyslipidemia history colonic polyps and a strong family history of colon cancer. She is due for followup colonoscopy in about 6 months. No concerns or complaints today  Past Medical History  Diagnosis Date  . Hyperlipidemia   . Hx of colonoscopy with polypectomy   . Thyroid disease     History   Social History  . Marital Status: Married    Spouse Name: N/A    Number of Children: N/A  . Years of Education: N/A   Occupational History  . Not on file.   Social History Main Topics  . Smoking status: Never Smoker   . Smokeless tobacco: Never Used  . Alcohol Use: No  . Drug Use: No  . Sexually Active: Not on file   Other Topics Concern  . Not on file   Social History Narrative  . No narrative on file    Past Surgical History  Procedure Laterality Date  . Appendectomy    . Abdominal hysterectomy    . Breast surgery      bx    Family History  Problem Relation Age of Onset  . Cancer Mother     colon  . Heart disease Father     No Known Allergies  Current Outpatient Prescriptions on File Prior to Visit  Medication Sig Dispense Refill  . aspirin 81 MG tablet Take 81 mg by mouth daily.      . calcium carbonate (OS-CAL) 600 MG TABS Take 600 mg by mouth daily.      . Cholecalciferol (VITAMIN D-3 PO) Take by mouth daily.      Marland Kitchen ESTRACE VAGINAL 0.1 MG/GM vaginal cream 0.5 g once a week.       . levothyroxine (SYNTHROID, LEVOTHROID) 75 MCG tablet Take 1 tablet (75 mcg total) by mouth daily.  90 tablet  6  . magnesium 30 MG tablet Take by mouth daily.      Marland Kitchen OVER THE COUNTER MEDICATION OTC SLEEP AID      . simvastatin (ZOCOR) 20 MG tablet Take 1 tablet (20 mg total) by mouth daily.  90 tablet  6   No current facility-administered medications on file prior to visit.    BP  126/80  Pulse 66  Temp(Src) 98.3 F (36.8 C) (Oral)  Resp 18  Wt 146 lb (66.225 kg)  BMI 23.58 kg/m2  SpO2 98%       Review of Systems  Constitutional: Negative.   HENT: Negative for hearing loss, congestion, sore throat, rhinorrhea, dental problem, sinus pressure and tinnitus.   Eyes: Negative for pain, discharge and visual disturbance.  Respiratory: Negative for cough and shortness of breath.   Cardiovascular: Negative for chest pain, palpitations and leg swelling.  Gastrointestinal: Negative for nausea, vomiting, abdominal pain, diarrhea, constipation, blood in stool and abdominal distention.  Genitourinary: Negative for dysuria, urgency, frequency, hematuria, flank pain, vaginal bleeding, vaginal discharge, difficulty urinating, vaginal pain and pelvic pain.  Musculoskeletal: Negative for joint swelling, arthralgias and gait problem.  Skin: Negative for rash.  Neurological: Negative for dizziness, syncope, speech difficulty, weakness, numbness and headaches.  Hematological: Negative for adenopathy.  Psychiatric/Behavioral: Negative for behavioral problems, dysphoric mood and agitation. The patient is not nervous/anxious.        Objective:   Physical Exam  Constitutional: She  is oriented to person, place, and time. She appears well-developed and well-nourished.  HENT:  Head: Normocephalic.  Right Ear: External ear normal.  Left Ear: External ear normal.  Mouth/Throat: Oropharynx is clear and moist.  Eyes: Conjunctivae and EOM are normal. Pupils are equal, round, and reactive to light.  Neck: Normal range of motion. Neck supple. No thyromegaly present.  Cardiovascular: Normal rate, regular rhythm, normal heart sounds and intact distal pulses.   Pulmonary/Chest: Effort normal and breath sounds normal.  Abdominal: Soft. Bowel sounds are normal. She exhibits no mass. There is no tenderness.  Musculoskeletal: Normal range of motion.  Lymphadenopathy:    She has no cervical  adenopathy.  Neurological: She is alert and oriented to person, place, and time.  Skin: Skin is warm and dry. No rash noted.  Psychiatric: She has a normal mood and affect. Her behavior is normal.          Assessment & Plan:   Dyslipidemia. Continue simvastatin Hypothyroidism. Continue supplement will Synthroid  CPX 6 months Schedule colonoscopy at that time

## 2013-06-03 ENCOUNTER — Telehealth: Payer: Self-pay | Admitting: *Deleted

## 2013-06-03 ENCOUNTER — Encounter: Payer: Self-pay | Admitting: Gynecology

## 2013-06-03 ENCOUNTER — Ambulatory Visit (INDEPENDENT_AMBULATORY_CARE_PROVIDER_SITE_OTHER): Payer: Medicare PPO | Admitting: Gynecology

## 2013-06-03 ENCOUNTER — Other Ambulatory Visit (HOSPITAL_COMMUNITY)
Admission: RE | Admit: 2013-06-03 | Discharge: 2013-06-03 | Disposition: A | Discharge status: Home / Self Care | Payer: Medicare HMO | Source: Ambulatory Visit | Attending: Gynecology | Admitting: Gynecology

## 2013-06-03 VITALS — BP 136/88 | Ht 62.5 in | Wt 142.8 lb

## 2013-06-03 DIAGNOSIS — N949 Unspecified condition associated with female genital organs and menstrual cycle: Secondary | ICD-10-CM

## 2013-06-03 DIAGNOSIS — R141 Gas pain: Secondary | ICD-10-CM

## 2013-06-03 DIAGNOSIS — R140 Abdominal distension (gaseous): Secondary | ICD-10-CM | POA: Insufficient documentation

## 2013-06-03 DIAGNOSIS — B373 Candidiasis of vulva and vagina: Secondary | ICD-10-CM

## 2013-06-03 DIAGNOSIS — R102 Pelvic and perineal pain: Secondary | ICD-10-CM | POA: Insufficient documentation

## 2013-06-03 DIAGNOSIS — Z1272 Encounter for screening for malignant neoplasm of vagina: Secondary | ICD-10-CM

## 2013-06-03 DIAGNOSIS — Z124 Encounter for screening for malignant neoplasm of cervix: Secondary | ICD-10-CM | POA: Insufficient documentation

## 2013-06-03 DIAGNOSIS — N952 Postmenopausal atrophic vaginitis: Secondary | ICD-10-CM

## 2013-06-03 DIAGNOSIS — Z23 Encounter for immunization: Secondary | ICD-10-CM

## 2013-06-03 DIAGNOSIS — N390 Urinary tract infection, site not specified: Secondary | ICD-10-CM

## 2013-06-03 DIAGNOSIS — N898 Other specified noninflammatory disorders of vagina: Secondary | ICD-10-CM

## 2013-06-03 DIAGNOSIS — Z1151 Encounter for screening for human papillomavirus (HPV): Secondary | ICD-10-CM | POA: Insufficient documentation

## 2013-06-03 DIAGNOSIS — N950 Postmenopausal bleeding: Secondary | ICD-10-CM

## 2013-06-03 LAB — URINALYSIS W MICROSCOPIC + REFLEX CULTURE
Bilirubin Urine: NEGATIVE
Crystals: NONE SEEN
Ketones, ur: NEGATIVE mg/dL
Protein, ur: NEGATIVE mg/dL
Specific Gravity, Urine: 1.01 (ref 1.005–1.030)
Urobilinogen, UA: 0.2 mg/dL (ref 0.0–1.0)

## 2013-06-03 LAB — WET PREP FOR TRICH, YEAST, CLUE
Clue Cells Wet Prep HPF POC: NONE SEEN
Trich, Wet Prep: NONE SEEN

## 2013-06-03 MED ORDER — FLUCONAZOLE 150 MG PO TABS: 150.0000 mg | ORAL_TABLET | Freq: Once | ORAL | Status: DC

## 2013-06-03 MED ORDER — CIPROFLOXACIN HCL 250 MG PO TABS: 250.0000 mg | ORAL_TABLET | Freq: Two times a day (BID) | ORAL | Status: DC

## 2013-06-03 NOTE — Progress Notes (Signed)
Patient is a 72 year old new patient to the practice who was previously been followed by Dr. Nicholas Lose. She had seen him last year. Patient presents today for problem visit. She was complaining of lower bowel discomfort and slight low back discomfort some mild frequency but no dysuria. 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 She describes sometimes the discomfort radiating to her left groin area. She says is on and off not constant. She has had previous vaginal hysterectomy with 2 bladder suspension  in the past. She denies any stress urinary incontinence. She does still have her ovaries. Patient with a very strong family history of colon cancer whereby  one of her sons died from colon cancer.. She also has a nephew the recently died of colon cancer. Her mother and brother and aunt have had colon cancer. Patient herself has had history of colon polyps her last colonoscopy was over 5 years ago with Dr. Matthias Hughs. Patient is not sexually active. Patient denies any fever chills nausea or vomiting. Patient 2012 had a laparoscopic appendectomy. Patient also seen the urologist in the past for microscopic hematuria and had a negative CT scan a few years ago.  Exam: Back: No CVA tenderness Abdomen: Soft some bloating noted but no rebound guarding or tenderness. Pelvic exam: Bartholin urethra Skene glands with atrophic changes Vagina friable contact with applied speculum. Vaginal cuff intact Bimanual exam no palpable mass or tenderness Rectal exam deferred  Wet prep demonstrated many EDC and many white blood cells and many bacteria.  Urinalysis 7-to 10 WBCs, 3-6 RBC, rare bacteria  Assessment/plan: Patient with vaginal atrophy with vulvovaginitis will be instructed to hold off on the estrogen vaginal cream application for one week. She will be started on Diflucan 150 mg one by mouth today. For  her early urinary tract infection she will be placed on Cipro 250 mg twice a day for 3 days. The patient will be asked to return to the office for an ultrasound for better assessment of her ovaries. If her ultrasound is negative I have encouraged her to get in contact with her gastroenterologist and she is overdue for colonoscopy especially with her strong family history of colon cancer and her personal history of colon polyps. She will be provided with Hemoccult stool cards to prevent in the office for testing. She is not due for her exam until left November. She will need a bone density study since has been over 2 years. Her mammogram was in November 2013 was normal.

## 2013-06-03 NOTE — Addendum Note (Signed)
Addended by: Bertram Savin A on: 06/03/2013 11:56 AM   Modules accepted: Orders

## 2013-06-03 NOTE — Patient Instructions (Addendum)
Asymptomatic Bacteriuria, Female Your urine study shows bacteria in your urine. You do not have the usual symptoms of burning or frequent urination. This is why it is called asymptomatic. You may need treatment with antibiotics. Treatment is especially important if you are pregnant. Sometimes this condition can progress to a more severe bladder or kidney infection. Symptoms include burning when urinating, back pain, fever, nausea, or vomiting. Take your antibiotics as directed. Finish them even if you start to feel better. Drink enough water and fluids to keep your urine clear or pale yellow. Go to the bathroom more frequently to keep your bladder empty. Keep the area around the vagina and rectum clean. Wipe yourself from front to back after urinating. Call your caregiver to arrange for follow-up care.  SEEK IMMEDIATE MEDICAL CARE IF:  You develop repeated vomiting.  You develop severe back or abdominal pain.  You have abnormal vaginal discharge or bleeding.  You have blood in the urine.  You develop cramping or abdominal pain.  You have a fever. If you are pregnant and develop any of the above problems see your caregiver or seek care immediately. Document Released: 08/19/2005 Document Revised: 11/11/2011 Document Reviewed: 07/05/2009 [facility] Patient Information 2014 [place], [place]. Candidal Vulvovaginitis Candidal vulvovaginitis is an infection of the vagina and vulva. The vulva is the skin around the opening of the vagina. This may cause itching and discomfort in and around the vagina.  HOME CARE  Only take medicine as told by your doctor.  Do not have sex (intercourse) until the infection is healed or as told by your doctor.  Practice safe sex.  Tell your sex partner about your infection.  Do not douche or use tampons.  Wear cotton underwear. Do not wear tight pants or panty hose.  Eat yogurt. This may help treat and prevent yeast infections. GET HELP RIGHT AWAY IF:   You  have a fever.  Your problems get worse during treatment or do not get better in 3 days.  You have discomfort, irritation, or itching in your vagina or vulva area.  You have pain after sex.  You start to get belly (abdominal) pain. MAKE SURE YOU:  Understand these instructions.  Will watch your condition.  Will get help right away if you are not doing well or get worse. Document Released: 11/15/2008 Document Revised: 11/11/2011 Document Reviewed: 11/15/2008 [facility] Patient Information 2014 [place], [place]. Influenza Vaccine (Flu Vaccine, Inactivated) 2013 2014 What You Need to Know WHY GET VACCINATED? Influenza ("flu") is a contagious disease that spreads around the [place] every winter, usually between October and May. Flu is caused by the influenza virus, and can be spread by coughing, sneezing, and close contact. Anyone can get flu, but the risk of getting flu is highest among children. Symptoms come on suddenly and may last several days. They can include: Fever or chills. Sore throat. Muscle aches. Fatigue. Cough. Headache. Runny or stuffy nose. Flu can make some people much sicker than others. These people include young children, people 53 and older, pregnant women, and people with certain health conditions such as heart, lung or kidney disease, or a weakened immune system. Flu vaccine is especially important for these people, and anyone in close contact with them. Flu can also lead to pneumonia, and make existing medical conditions worse. It can cause diarrhea and seizures in children. Each year thousands of people in the [place] die from flu, and many more are hospitalized. Flu vaccine is the best protection we have  from flu and its complications. Flu vaccine also helps prevent spreading flu from person to person. INACTIVATED FLU VACCINE There are 2 types of influenza vaccine: You are getting an inactivated flu vaccine, which does not contain any live influenza  virus. It is given by injection with a needle, and often called the "flu shot." A different live, attenuated (weakened) influenza vaccine is sprayed into the nostrils. This vaccine is described in a separate Vaccine Information Statement. Flu vaccine is recommended every year. Children 6 months through 78 years of age should get 2 doses the first year they get vaccinated. Flu viruses are always changing. Each year's flu vaccine is made to protect from viruses that are most likely to cause disease that year. While flu vaccine cannot prevent all cases of flu, it is our best defense against the disease. Inactivated flu vaccine protects against 3 or 4 different influenza viruses. It takes about 2 weeks for protection to develop after the vaccination, and protection lasts several months to a year. Some illnesses that are not caused by influenza virus are often mistaken for flu. Flu vaccine will not prevent these illnesses. It can only prevent influenza. A "high-dose" flu vaccine is available for people 84 years of age and older. The person giving you the vaccine can tell you more about it. Some inactivated flu vaccine contains a very small amount of a mercury-based preservative called thimerosal. Studies have shown that thimerosal in vaccines is not harmful, but flu vaccines that do not contain a preservative are available. SOME PEOPLE SHOULD NOT GET THIS VACCINE Tell the person who gives you the vaccine: If you have any severe (life-threatening) allergies. If you ever had a life-threatening allergic reaction after a dose of flu vaccine, or have a severe allergy to any part of this vaccine, you may be advised not to get a dose. Most, but not all, types of flu vaccine contain a small amount of egg. If you ever had Guillain Barr Syndrome (a severe paralyzing illness, also called GBS). Some people with a history of GBS should not get this vaccine. This should be discussed with your doctor. If you are not feeling  well. They might suggest waiting until you feel better. But you should come back. RISKS OF A VACCINE REACTION With a vaccine, like any medicine, there is a chance of side effects. These are usually mild and go away on their own. Serious side effects are also possible, but are very rare. Inactivated flu vaccine does not contain live flu virus, sogetting flu from this vaccine is not possible. Brief fainting spells and related symptoms (such as jerking movements) can happen after any medical procedure, including vaccination. Sitting or lying down for about 15 minutes after a vaccination can help prevent fainting and injuries caused by falls. Tell your doctor if you feel dizzy or lightheaded, or have vision changes or ringing in the ears. Mild problems following inactivated flu vaccine: Soreness, redness, or swelling where the shot was given. Hoarseness; sore, red or itchy eyes; or cough. Fever. Aches. Headache. Itching. Fatigue. If these problems occur, they usually begin soon after the shot and last 1 or 2 days. Moderate problems following inactivated flu vaccine: Young children who get inactivated flu vaccine and pneumococcal vaccine (PCV13) at the same time may be at increased risk for seizures caused by fever. Ask your doctor for more information. Tell your doctor if a child who is getting flu vaccine has ever had a seizure. Severe problems following inactivated flu vaccine:  A severe allergic reaction could occur after any vaccine (estimated less than 1 in a million doses). There is a small possibility that inactivated flu vaccine could be associated with Guillan Barr Syndrome (GBS), no more than 1 or 2 cases per million people vaccinated. This is much lower than the risk of severe complications from flu, which can be prevented by flu vaccine. The safety of vaccines is always being monitored. For more information, visit: http://floyd.org/ WHAT IF THERE IS A SERIOUS REACTION? What  should I look for? Look for anything that concerns you, such as signs of a severe allergic reaction, very high fever, or behavior changes. Signs of a severe allergic reaction can include hives, swelling of the face and throat, difficulty breathing, a fast heartbeat, dizziness, and weakness. These would start a few minutes to a few hours after the vaccination. What should I do? If you think it is a severe allergic reaction or other emergency that cannot wait, call [phone] 1 1  or get the person to the nearest hospital. Otherwise, call your doctor. Afterward, the reaction should be reported to the Vaccine Adverse Event Reporting System (VAERS). Your doctor might file this report, or you can do it yourself through the VAERS website at www.vaers.LAgents.no, or by calling 1-209-520-2084. VAERS is only for reporting reactions. They do not give medical advice. THE NATIONAL VACCINE INJURY COMPENSATION PROGRAM The National Vaccine Injury Compensation Program (VICP) is a federal program that was created to compensate people who may have been injured by certain vaccines. Persons who believe they may have been injured by a vaccine can learn about the program and about filing a claim by calling 1-952-601-9614 or visiting the VICP website at SpiritualWord.at HOW CAN I LEARN MORE? Ask your doctor. Call your local or state health department. Contact the Centers for Disease Control and Prevention (CDC): Call 209-220-9990 (1-800-CDC-INFO) or Visit CDC's website at BiotechRoom.com.cy CDC Inactivated Influenza Vaccine Interim VIS (03/27/12) Document Released: 06/13/2006 Document Revised: 05/13/2012 Document Reviewed: 03/27/2012 [facility] Patient Information 2014 [place], [place]. Vaginitis Vaginitis is an inflammation of the vagina. It is most often caused by a change in the normal balance of the bacteria and yeast that live in the vagina. This change in balance causes an overgrowth of certain bacteria or yeast,  which causes the inflammation. There are different types of vaginitis, but the most common types are: Bacterial vaginosis. Yeast infection (candidiasis). Trichomoniasis vaginitis. This is a sexually transmitted infection (STI). Viral vaginitis. Atropic vaginitis. Allergic vaginitis. CAUSES  The cause depends on the type of vaginitis. Vaginitis can be caused by: Bacteria (bacterial vaginosis). Yeast (yeast infection). A parasite (trichomoniasis vaginitis) A virus (viral vaginitis). Low hormone levels (atrophic vaginitis). Low hormone levels can occur during pregnancy, breastfeeding, or after menopause. Irritants, such as bubble baths, scented tampons, and feminine sprays (allergic vaginitis). Other factors can change the normal balance of the yeast and bacteria that live in the vagina. These include: Antibiotic medicines. Poor hygiene. Diaphragms, vaginal sponges, spermicides, birth control pills, and intrauterine devices (IUD). Sexual intercourse. Infection. Uncontrolled diabetes. A weakened immune system. SYMPTOMS  Symptoms can vary depending on the cause of the vaginitis. Common symptoms include: Abnormal vaginal discharge. The discharge is white, gray, or yellow with bacterial vaginosis. The discharge is thick, white, and cheesy with a yeast infection. The discharge is frothy and yellow or greenish with trichomoniasis. A bad vaginal odor. The odor is fishy with bacterial vaginosis. Vaginal itching, pain, or swelling. Painful intercourse. Pain or burning when urinating. Sometimes, there are  no symptoms. TREATMENT  Treatment will vary depending on the type of infection.  Bacterial vaginosis and trichomoniasis are often treated with antibiotic creams or pills. Yeast infections are often treated with antifungal medicines, such as vaginal creams or suppositories. Viral vaginitis has no cure, but symptoms can be treated with medicines that relieve discomfort. Your sexual partner  should be treated as well. Atrophic vaginitis may be treated with an estrogen cream, pill, suppository, or vaginal ring. If vaginal dryness occurs, lubricants and moisturizing creams may help. You may be told to avoid scented soaps, sprays, or douches. Allergic vaginitis treatment involves quitting the use of the product that is causing the problem. Vaginal creams can be used to treat the symptoms. HOME CARE INSTRUCTIONS  Take all medicines as directed by your caregiver. Keep your genital area clean and dry. Avoid soap and only rinse the area with water. Avoid douching. It can remove the healthy bacteria in the vagina. Do not use tampons or have sexual intercourse until your vaginitis has been treated. Use sanitary pads while you have vaginitis. Wipe from front to back. This avoids the spread of bacteria from the rectum to the vagina. Let air reach your genital area. Wear cotton underwear to decrease moisture buildup. Avoid wearing underwear while you sleep until your vaginitis is gone. Avoid tight pants and underwear or nylons without a cotton panel. Take off wet clothing (especially bathing suits) as soon as possible. Use mild, non-scented products. Avoid using irritants, such as: Scented feminine sprays. Fabric softeners. Scented detergents. Scented tampons. Scented soaps or bubble baths. Practice safe sex and use condoms. Condoms may prevent the spread of trichomoniasis and viral vaginitis. SEEK MEDICAL CARE IF:  You have abdominal pain. You have a fever or persistent symptoms for more than 2 3 days. You have a fever and your symptoms suddenly get worse. Document Released: 06/16/2007 Document Revised: 05/13/2012 Document Reviewed: 01/30/2012 [facility] Patient Information 2014 [place], [place].

## 2013-06-03 NOTE — Telephone Encounter (Signed)
Cipro and diflucan from today visit  were both sent to wrong pharmacy. I sent to local pharmacy, pt informed.

## 2013-06-04 LAB — URINE CULTURE: Organism ID, Bacteria: NO GROWTH

## 2013-06-07 ENCOUNTER — Other Ambulatory Visit: Payer: Self-pay | Admitting: Gynecology

## 2013-06-07 DIAGNOSIS — R3129 Other microscopic hematuria: Secondary | ICD-10-CM

## 2013-06-10 ENCOUNTER — Ambulatory Visit (INDEPENDENT_AMBULATORY_CARE_PROVIDER_SITE_OTHER): Payer: Medicare PPO

## 2013-06-10 ENCOUNTER — Ambulatory Visit (INDEPENDENT_AMBULATORY_CARE_PROVIDER_SITE_OTHER): Payer: Medicare PPO | Admitting: Gynecology

## 2013-06-10 VITALS — BP 136/84

## 2013-06-10 DIAGNOSIS — N83209 Unspecified ovarian cyst, unspecified side: Secondary | ICD-10-CM

## 2013-06-10 DIAGNOSIS — R102 Pelvic and perineal pain: Secondary | ICD-10-CM

## 2013-06-10 DIAGNOSIS — N949 Unspecified condition associated with female genital organs and menstrual cycle: Secondary | ICD-10-CM

## 2013-06-10 DIAGNOSIS — R3129 Other microscopic hematuria: Secondary | ICD-10-CM

## 2013-06-10 DIAGNOSIS — N83202 Unspecified ovarian cyst, left side: Secondary | ICD-10-CM

## 2013-06-10 DIAGNOSIS — N83339 Acquired atrophy of ovary and fallopian tube, unspecified side: Secondary | ICD-10-CM

## 2013-06-10 DIAGNOSIS — R1032 Left lower quadrant pain: Secondary | ICD-10-CM

## 2013-06-10 DIAGNOSIS — N950 Postmenopausal bleeding: Secondary | ICD-10-CM

## 2013-06-10 DIAGNOSIS — R140 Abdominal distension (gaseous): Secondary | ICD-10-CM

## 2013-06-10 DIAGNOSIS — R141 Gas pain: Secondary | ICD-10-CM

## 2013-06-10 NOTE — Progress Notes (Signed)
Patient presented to the office today for followup ultrasound and discussion and further evaluation and so she was seen in the office on October 2. Patient had been complaining of lower bowel discomfort and slight low back discomfort some mild frequency but no dysuria. 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 She describes sometimes the discomfort radiating to her left groin area. She says is on and off not constant. She has had previous vaginal hysterectomy with 2 bladder suspension in the past. She denies any stress urinary incontinence. She does still have her ovaries. Patient with a very strong family history of colon cancer whereby one of her sons died from colon cancer.. She also has a nephew the recently died of colon cancer. Her mother and brother and aunt have had colon cancer. Patient herself has had history of colon polyps her last colonoscopy was over 5 years ago with Dr. Matthias Hughs. Patient is not sexually active.  On the last office visit patient was treated for UTI 250 mg twice a day for 3 days and was given a prescription diaphragm and 50 mg. She was then to restart once again her estrogen vaginal cream twice weekly she was doing. Patient reports no more problems. She still has not seen a gastroenterologist. She still needs her bone density scheduled.  Ultrasound today: Absent uterus. Right ovary atrophic. No apparent mass of the right adnexa. Left ovarian thin wall after freeing avascular cyst measuring 21 x 19 x 19 mm was noted with no fluid in the cul-de-sac.  Assessment/plan: 72 year old with simple appearing 2 cm size right ovarian cyst. We discussed that the fetus appeared to be benign. We will check a CA 125. Limitations of the CA 125 were discussed with the patient. She'll return back in the office in 3 months for followup ultrasound.

## 2013-06-10 NOTE — Patient Instructions (Signed)
CA-125 Tumor Marker CA 125 is a tumor marker that is used to help monitor the course of ovarian or endometrial cancer. PREPARATION FOR TEST No preparation is necessary. NORMAL FINDINGS Adults: 0-35 units/mL (0-35 kilounits)/L Ranges for normal findings may vary among different laboratories and hospitals. You should always check with your doctor after having lab work or other tests done to discuss the meaning of your test results and whether your values are considered within normal limits. MEANING OF TEST  Your caregiver will go over the test results with you and discuss the importance and meaning of your results, as well as treatment options and the need for additional tests if necessary. OBTAINING THE TEST RESULTS It is your responsibility to obtain your test results. Ask the lab or department performing the test when and how you will get your results. Document Released: 09/10/2004 Document Revised: 11/11/2011 Document Reviewed: 07/27/2008 [facility] Patient Information 2014 [place], [place]. Ovarian Cyst The ovaries are small organs that are on each side of the uterus. The ovaries are the organs that produce the female hormones, estrogen and progesterone. An ovarian cyst is a sac filled with fluid that can vary in its size. It is normal for a small cyst to form in women who are in the childbearing age and who have menstrual periods. This type of cyst is called a follicle cyst that becomes an ovulation cyst (corpus luteum cyst) after it produces the women's egg. It later goes away on its own if the woman does not become pregnant. There are other kinds of ovarian cysts that may cause problems and may need to be treated. The most serious problem is a cyst with cancer. It should be noted that menopausal women who have an ovarian cyst are at a higher risk of it being a cancer cyst. They should be evaluated very quickly, thoroughly and followed closely. This is especially true in menopausal women because of  the high rate of ovarian cancer in women in menopause. CAUSES AND TYPES OF OVARIAN CYSTS:  FUNCTIONAL CYST: The follicle/corpus luteum cyst is a functional cyst that occurs every month during ovulation with the menstrual cycle. They go away with the next menstrual cycle if the woman does not get pregnant. Usually, there are no symptoms with a functional cyst.  ENDOMETRIOMA CYST: This cyst develops from the lining of the uterus tissue. This cyst gets in or on the ovary. It grows every month from the bleeding during the menstrual period. It is also called a "chocolate cyst" because it becomes filled with blood that turns brown. This cyst can cause pain in the lower abdomen during intercourse and with your menstrual period.  CYSTADENOMA CYST: This cyst develops from the cells on the outside of the ovary. They usually are not cancerous. They can get very big and cause lower abdomen pain and pain with intercourse. This type of cyst can twist on itself, cut off its blood supply and cause severe pain. It also can easily rupture and cause a lot of pain.  DERMOID CYST: This type of cyst is sometimes found in both ovaries. They are found to have different kinds of body tissue in the cyst. The tissue includes skin, teeth, hair, and/or cartilage. They usually do not have symptoms unless they get very big. Dermoid cysts are rarely cancerous.  POLYCYSTIC OVARY: This is a rare condition with hormone problems that produces many small cysts on both ovaries. The cysts are follicle-like cysts that never produce an egg and become a corpus  luteum. It can cause an increase in body weight, infertility, acne, increase in body and facial hair and lack of menstrual periods or rare menstrual periods. Many women with this problem develop type 2 diabetes. The exact cause of this problem is unknown. A polycystic ovary is rarely cancerous.  THECA LUTEIN CYST: Occurs when too much hormone (human chorionic gonadotropin) is produced and  over-stimulates the ovaries to produce an egg. They are frequently seen when doctors stimulate the ovaries for invitro-fertilization (test tube babies).  LUTEOMA CYST: This cyst is seen during pregnancy. Rarely it can cause an obstruction to the birth canal during labor and delivery. They usually go away after delivery. SYMPTOMS   Pelvic pain or pressure.  Pain during sexual intercourse.  Increasing girth (swelling) of the abdomen.  Abnormal menstrual periods.  Increasing pain with menstrual periods.  You stop having menstrual periods and you are not pregnant. DIAGNOSIS  The diagnosis can be made during:  Routine or annual pelvic examination (common).  Ultrasound.  X-ray of the pelvis.  CT Scan.  MRI.  Blood tests. TREATMENT   Treatment may only be to follow the cyst monthly for 2 to 3 months with your caregiver. Many go away on their own, especially functional cysts.  May be aspirated (drained) with a long needle with ultrasound, or by laparoscopy (inserting a tube into the pelvis through a small incision).  The whole cyst can be removed by laparoscopy.  Sometimes the cyst may need to be removed through an incision in the lower abdomen.  Hormone treatment is sometimes used to help dissolve certain cysts.  Birth control pills are sometimes used to help dissolve certain cysts. HOME CARE INSTRUCTIONS  Follow your caregiver's advice regarding:  Medicine.  Follow up visits to evaluate and treat the cyst.  You may need to come back or make an appointment with another caregiver, to find the exact cause of your cyst, if your caregiver is not a gynecologist.  Get your yearly and recommended pelvic examinations and Pap tests.  Let your caregiver know if you have had an ovarian cyst in the past. SEEK MEDICAL CARE IF:   Your periods are late, irregular, they stop, or are painful.  Your stomach (abdomen) or pelvic pain does not go away.  Your stomach becomes larger or  swollen.  You have pressure on your bladder or trouble emptying your bladder completely.  You have painful sexual intercourse.  You have feelings of fullness, pressure, or discomfort in your stomach.  You lose weight for no apparent reason.  You feel generally ill.  You become constipated.  You lose your appetite.  You develop acne.  You have an increase in body and facial hair.  You are gaining weight, without changing your exercise and eating habits.  You think you are pregnant. SEEK IMMEDIATE MEDICAL CARE IF:   You have increasing abdominal pain.  You feel sick to your stomach (nausea) and/or vomit.  You develop a fever that comes on suddenly.  You develop abdominal pain during a bowel movement.  Your menstrual periods become heavier than usual. Document Released: 08/19/2005 Document Revised: 11/11/2011 Document Reviewed: 06/22/2009 [facility] Patient Information 2014 [place], [place].

## 2013-06-11 LAB — URINALYSIS W MICROSCOPIC + REFLEX CULTURE
Bilirubin Urine: NEGATIVE
Crystals: NONE SEEN
Nitrite: NEGATIVE
Specific Gravity, Urine: 1.006 (ref 1.005–1.030)
Urobilinogen, UA: 0.2 mg/dL (ref 0.0–1.0)
pH: 7 (ref 5.0–8.0)

## 2013-06-12 LAB — URINE CULTURE: Organism ID, Bacteria: NO GROWTH

## 2013-06-23 ENCOUNTER — Other Ambulatory Visit: Payer: Self-pay

## 2013-06-23 DIAGNOSIS — Z1231 Encounter for screening mammogram for malignant neoplasm of breast: Secondary | ICD-10-CM

## 2013-06-28 ENCOUNTER — Other Ambulatory Visit: Payer: Commercial Managed Care - HMO | Admitting: Anesthesiology

## 2013-06-28 DIAGNOSIS — Z1211 Encounter for screening for malignant neoplasm of colon: Secondary | ICD-10-CM

## 2013-07-16 ENCOUNTER — Encounter: Payer: Self-pay | Admitting: *Deleted

## 2013-07-19 ENCOUNTER — Ambulatory Visit (INDEPENDENT_AMBULATORY_CARE_PROVIDER_SITE_OTHER): Payer: Medicare HMO | Admitting: Internal Medicine

## 2013-07-19 ENCOUNTER — Encounter: Payer: Self-pay | Admitting: Internal Medicine

## 2013-07-19 VITALS — BP 120/80 | HR 67 | Temp 97.9°F | Resp 20 | Wt 145.0 lb

## 2013-07-19 DIAGNOSIS — Z86010 Personal history of colonic polyps: Secondary | ICD-10-CM

## 2013-07-19 DIAGNOSIS — E7800 Pure hypercholesterolemia, unspecified: Secondary | ICD-10-CM

## 2013-07-19 DIAGNOSIS — Z23 Encounter for immunization: Secondary | ICD-10-CM

## 2013-07-19 DIAGNOSIS — E039 Hypothyroidism, unspecified: Secondary | ICD-10-CM

## 2013-07-19 LAB — LIPID PANEL
Cholesterol: 144 mg/dL (ref 0–200)
HDL: 55.2 mg/dL (ref >39.00)
LDL Cholesterol: 70 mg/dL (ref 0–99)
Total CHOL/HDL Ratio: 3
Triglycerides: 92 mg/dL (ref 0.0–149.0)
VLDL: 18.4 mg/dL (ref 0.0–40.0)

## 2013-07-19 LAB — CBC WITH DIFFERENTIAL/PLATELET
Basophils Absolute: 0 10*3/uL (ref 0.0–0.1)
Basophils Relative: 0.5 % (ref 0.0–3.0)
Eosinophils Absolute: 0.1 10*3/uL (ref 0.0–0.7)
Eosinophils Relative: 2.1 % (ref 0.0–5.0)
HCT: 42.5 % (ref 36.0–46.0)
Hemoglobin: 14.4 g/dL (ref 12.0–15.0)
Lymphocytes Relative: 29.3 % (ref 12.0–46.0)
Lymphs Abs: 1.4 10*3/uL (ref 0.7–4.0)
MCHC: 34 g/dL (ref 30.0–36.0)
MCV: 90 fl (ref 78.0–100.0)
Monocytes Absolute: 0.3 10*3/uL (ref 0.1–1.0)
Monocytes Relative: 6.7 % (ref 3.0–12.0)
Neutro Abs: 3 10*3/uL (ref 1.4–7.7)
Neutrophils Relative %: 61.4 % (ref 43.0–77.0)
Platelets: 197 10*3/uL (ref 150.0–400.0)
RBC: 4.72 Mil/uL (ref 3.87–5.11)
RDW: 12.5 % (ref 11.5–14.6)
WBC: 4.9 10*3/uL (ref 4.5–10.5)

## 2013-07-19 LAB — COMPREHENSIVE METABOLIC PANEL
ALT: 17 U/L (ref 0–35)
AST: 24 U/L (ref 0–37)
Albumin: 4.4 g/dL (ref 3.5–5.2)
Alkaline Phosphatase: 58 U/L (ref 39–117)
BUN: 13 mg/dL (ref 6–23)
CO2: 28 mEq/L (ref 19–32)
Calcium: 9.4 mg/dL (ref 8.4–10.5)
Chloride: 105 mEq/L (ref 96–112)
Creatinine, Ser: 0.9 mg/dL (ref 0.4–1.2)
GFR: 68.59 mL/min (ref >60.00)
Glucose, Bld: 87 mg/dL (ref 70–99)
Potassium: 4.2 mEq/L (ref 3.5–5.1)
Sodium: 139 mEq/L (ref 135–145)
Total Bilirubin: 0.8 mg/dL (ref 0.3–1.2)
Total Protein: 7.1 g/dL (ref 6.0–8.3)

## 2013-07-19 LAB — TSH: TSH: 0.33 u[IU]/mL — ABNORMAL LOW (ref 0.35–5.50)

## 2013-07-19 MED ORDER — ALPRAZOLAM 0.5 MG PO TABS: ORAL_TABLET | ORAL | Status: DC

## 2013-07-19 NOTE — Progress Notes (Signed)
Subjective:    Patient ID: [name], female    DOB: 10-14-1940, 72 y.o.   MRN: [id number]  HPI Pre-visit discussion using our clinic review tool. No additional management support is needed unless otherwise documented below in the visit note.  72 year old patient who is seen today for followup. She has hypertension and dyslipidemia. She has a personal history of polyps and a strong family history of colon cancer including 2 first degree relatives. She is due for followup colonoscopy soon. No new concerns or complaints except for some mild anxiety late in the evening. She does use alprazolam as needed with benefit. She has had a recent gynecologic exam.  BP Readings from Last 3 Encounters:  07/19/13 120/80  06/10/13 136/84  06/03/13 136/88   Past Medical History  Diagnosis Date  . Hyperlipidemia   . Hx of colonoscopy with polypectomy   . Thyroid disease     History   Social History  . Marital Status: Married    Spouse Name: N/A    Number of Children: N/A  . Years of Education: N/A   Occupational History  . Not on file.   Social History Main Topics  . Smoking status: Never Smoker   . Smokeless tobacco: Never Used  . Alcohol Use: No  . Drug Use: No  . Sexual Activity: Not on file   Other Topics Concern  . Not on file   Social History Narrative  . No narrative on file    Past Surgical History  Procedure Laterality Date  . Appendectomy    . Abdominal hysterectomy    . Breast surgery      LUMPECTOMY- FIBROID TUMOR  . Enterocele repair      Family History  Problem Relation Age of Onset  . Cancer Mother     colon  . Heart disease Father   . Cancer Brother     COLON  . Cancer Maternal Aunt     COLON  . Cancer Son     COLON    No Known Allergies  Current Outpatient Prescriptions on File Prior to Visit  Medication Sig Dispense Refill  . ALPRAZolam (XANAX) 0.5 MG tablet TAKE ONE TABLET BY MOUTH EVERY DAY AS NEEDED  60 tablet  2  . aspirin 81 MG tablet  Take 81 mg by mouth daily.      . calcium carbonate (OS-CAL) 600 MG TABS Take 600 mg by mouth daily.      . Cholecalciferol (VITAMIN D-3 PO) Take by mouth daily.      Marland Kitchen estradiol (ESTRACE VAGINAL) 0.1 MG/GM vaginal cream Place 0.25 Applicatorfuls vaginally once a week.  42.5 g  6  . levothyroxine (SYNTHROID, LEVOTHROID) 75 MCG tablet Take 1 tablet (75 mcg total) by mouth daily.  90 tablet  6  . magnesium 30 MG tablet Take by mouth daily.      Marland Kitchen OVER THE COUNTER MEDICATION OTC SLEEP AID      . simvastatin (ZOCOR) 20 MG tablet Take 1 tablet (20 mg total) by mouth daily.  90 tablet  6   No current facility-administered medications on file prior to visit.    BP 120/80  Pulse 67  Temp(Src) 97.9 F (36.6 C) (Oral)  Resp 20  Wt 145 lb (65.772 kg)  SpO2 98%    Review of Systems  Constitutional: Negative.   HENT: Negative for congestion, dental problem, hearing loss, rhinorrhea, sinus pressure, sore throat and tinnitus.   Eyes: Negative for pain, discharge and visual  disturbance.  Respiratory: Negative for cough and shortness of breath.   Cardiovascular: Negative for chest pain, palpitations and leg swelling.  Gastrointestinal: Negative for nausea, vomiting, abdominal pain, diarrhea, constipation, blood in stool and abdominal distention.  Genitourinary: Negative for dysuria, urgency, frequency, hematuria, flank pain, vaginal bleeding, vaginal discharge, difficulty urinating, vaginal pain and pelvic pain.  Musculoskeletal: Negative for arthralgias, gait problem and joint swelling.  Skin: Negative for rash.  Neurological: Negative for dizziness, syncope, speech difficulty, weakness, numbness and headaches.  Hematological: Negative for adenopathy.  Psychiatric/Behavioral: Positive for sleep disturbance. Negative for behavioral problems, dysphoric mood and agitation. The patient is nervous/anxious.        Objective:   Physical Exam  Constitutional: She is oriented to person, place, and  time. She appears well-developed and well-nourished.  Blood pressure 124/80  HENT:  Head: Normocephalic.  Right Ear: External ear normal.  Left Ear: External ear normal.  Mouth/Throat: Oropharynx is clear and moist.  Eyes: Conjunctivae and EOM are normal. Pupils are equal, round, and reactive to light.  Neck: Normal range of motion. Neck supple. No thyromegaly present.  Cardiovascular: Normal rate, regular rhythm, normal heart sounds and intact distal pulses.   Pulmonary/Chest: Effort normal and breath sounds normal.  Abdominal: Soft. Bowel sounds are normal. She exhibits no mass. There is no tenderness.  Musculoskeletal: Normal range of motion.  Lymphadenopathy:    She has no cervical adenopathy.  Neurological: She is alert and oriented to person, place, and time.  Skin: Skin is warm and dry. No rash noted.  Psychiatric: She has a normal mood and affect. Her behavior is normal.          Assessment & Plan:   Colonic  polyps. We'll schedule followup colonoscopy Hypertension Anxiety disorder  CPX 6 months

## 2013-07-19 NOTE — Patient Instructions (Signed)
Limit your sodium (Salt) intake  Schedule your colonoscopy to help detect colon cancer.    It is important that you exercise regularly, at least 20 minutes 3 to 4 times per week.  If you develop chest pain or shortness of breath seek  medical attention.  Return in 6 months for follow-up

## 2013-07-19 NOTE — Progress Notes (Signed)
Pre-visit discussion using our clinic review tool. No additional management support is needed unless otherwise documented below in the visit note.  

## 2013-07-22 ENCOUNTER — Ambulatory Visit: Payer: Medicare HMO

## 2013-08-06 ENCOUNTER — Encounter: Payer: Self-pay | Admitting: Internal Medicine

## 2013-09-03 ENCOUNTER — Telehealth: Payer: Self-pay | Admitting: Internal Medicine

## 2013-09-03 NOTE — Telephone Encounter (Signed)
Requesting lab results done recently.

## 2013-09-03 NOTE — Telephone Encounter (Signed)
Spoke to pt told her labs were normal per Dr. Raliegh Ip and Total Cholesterol was 144, very good. Pt verbalized understanding.

## 2013-09-03 NOTE — Telephone Encounter (Signed)
Please call/notify patient that lab/test/procedure is normal . Total cholesterol 144

## 2013-09-03 NOTE — Telephone Encounter (Signed)
Please advise 

## 2013-09-23 ENCOUNTER — Other Ambulatory Visit: Payer: Self-pay | Admitting: Internal Medicine

## 2013-09-24 ENCOUNTER — Ambulatory Visit: Payer: Medicare HMO

## 2013-09-28 ENCOUNTER — Ambulatory Visit
Admission: RE | Admit: 2013-09-28 | Discharge: 2013-09-28 | Disposition: A | Discharge status: Home / Self Care | Payer: Self-pay | Source: Ambulatory Visit

## 2013-09-28 DIAGNOSIS — Z1231 Encounter for screening mammogram for malignant neoplasm of breast: Secondary | ICD-10-CM

## 2013-10-12 ENCOUNTER — Telehealth: Payer: Self-pay | Admitting: Internal Medicine

## 2013-10-12 ENCOUNTER — Ambulatory Visit: Payer: Medicare PPO | Admitting: Gynecology

## 2013-10-12 DIAGNOSIS — N952 Postmenopausal atrophic vaginitis: Secondary | ICD-10-CM

## 2013-10-12 DIAGNOSIS — R140 Abdominal distension (gaseous): Secondary | ICD-10-CM

## 2013-10-12 DIAGNOSIS — N950 Postmenopausal bleeding: Secondary | ICD-10-CM

## 2013-10-12 DIAGNOSIS — R102 Pelvic and perineal pain: Secondary | ICD-10-CM

## 2013-10-12 NOTE — Telephone Encounter (Signed)
Pt needs referral to Dr.Fernandez-GYN [phone]

## 2013-10-12 NOTE — Telephone Encounter (Signed)
Order for referral to GYN done.

## 2013-10-14 ENCOUNTER — Telehealth: Payer: Self-pay | Admitting: Internal Medicine

## 2013-10-14 NOTE — Telephone Encounter (Signed)
Pt needs referral for 3D  Mammogram. Pt had appt 09-28-13. Please send referral to breast center. Pt has [facility]

## 2013-10-25 ENCOUNTER — Other Ambulatory Visit: Payer: Self-pay | Admitting: Gynecology

## 2013-10-25 ENCOUNTER — Encounter: Payer: Self-pay | Admitting: Gynecology

## 2013-10-25 ENCOUNTER — Ambulatory Visit (INDEPENDENT_AMBULATORY_CARE_PROVIDER_SITE_OTHER): Payer: Commercial Managed Care - HMO | Admitting: Gynecology

## 2013-10-25 VITALS — BP 130/88

## 2013-10-25 DIAGNOSIS — R102 Pelvic and perineal pain: Secondary | ICD-10-CM

## 2013-10-25 DIAGNOSIS — Z1211 Encounter for screening for malignant neoplasm of colon: Secondary | ICD-10-CM

## 2013-10-25 DIAGNOSIS — M549 Dorsalgia, unspecified: Secondary | ICD-10-CM

## 2013-10-25 DIAGNOSIS — N952 Postmenopausal atrophic vaginitis: Secondary | ICD-10-CM

## 2013-10-25 DIAGNOSIS — K921 Melena: Secondary | ICD-10-CM

## 2013-10-25 DIAGNOSIS — N898 Other specified noninflammatory disorders of vagina: Secondary | ICD-10-CM

## 2013-10-25 DIAGNOSIS — N83209 Unspecified ovarian cyst, unspecified side: Secondary | ICD-10-CM | POA: Insufficient documentation

## 2013-10-25 DIAGNOSIS — R350 Frequency of micturition: Secondary | ICD-10-CM

## 2013-10-25 DIAGNOSIS — N949 Unspecified condition associated with female genital organs and menstrual cycle: Secondary | ICD-10-CM

## 2013-10-25 LAB — URINALYSIS W MICROSCOPIC + REFLEX CULTURE
BILIRUBIN URINE: NEGATIVE
CRYSTALS: NONE SEEN
Casts: NONE SEEN
Glucose, UA: NEGATIVE mg/dL
Ketones, ur: NEGATIVE mg/dL
NITRITE: NEGATIVE
PROTEIN: NEGATIVE mg/dL
SPECIFIC GRAVITY, URINE: 1.01 (ref 1.005–1.030)
UROBILINOGEN UA: 0.2 mg/dL (ref 0.0–1.0)
pH: 6.5 (ref 5.0–8.0)

## 2013-10-25 LAB — WET PREP FOR TRICH, YEAST, CLUE
Clue Cells Wet Prep HPF POC: NONE SEEN
Trich, Wet Prep: NONE SEEN
YEAST WET PREP: NONE SEEN

## 2013-10-25 MED ORDER — CIPROFLOXACIN HCL 250 MG PO TABS: 250.0000 mg | ORAL_TABLET | Freq: Two times a day (BID) | ORAL | Status: DC

## 2013-10-25 NOTE — Progress Notes (Signed)
   Patient is a 73 year old who presented to the office today complaining of left lower quadrant discomfort and back pain for 3 years which she states that worsened 2 weeks ago. She also has noted bloody stools and the other day when she went to sit the bathroom to urinate blood came out of her rectum. The patient has felt bloated. Patient is scheduled to see Dr. Cristina Gong gastroenterologist next week for a colonoscopy. Patient has very strong history of colon cancer as follows:  Her son died at age 67 of colon cancer Aunt was diagnosed with colon cancer at the age of 73 Mother history of colon cancer Brother history of colon cancer [place] daughter with colon cancer  Review of patient's records indicated when she was seen in October 2014 because of left lower quadrant discomfort an ultrasound had been done which demonstrated a small left ovarian cyst that measured 21 x 19 x 19 mm contralateral ovary normal previous hysterectomy. Normal CA 125.  Patient was complaining of urinary frequency today and thought she may have a vaginal infection:  Exam: Back: No CVA tenderness Abdomen: Soft nontender no rebound or guarding Pelvic: Bartholin urethra Skene gland within normal limits atrophic changes Vagina: Atrophic changes vaginal mucosa friable and contact Vaginal cuff intact Bimanual exam no rebound or guarding no large masses palpated on the adnexa Rectal exam Hemoccult negative  Urinalysis: 21-50 WBCs RB C3-6 RBC Bacteria few  Wet prep negative  Assessment/plan: #1 urinary tract infection highly suspicious. Will be treated with Cipro 250 mg twice a day for 3 days. #2 hematochezia strong family history of colorectal cancer patient will follow up with gastroenterologist on Monday for colonoscopy. #3 vaginal atrophy #4 small left ovarian cyst will schedule ultrasound for this week for followup. We will discuss vaginal estrogen cream twice a week after the upcoming ultrasound.

## 2013-10-25 NOTE — Patient Instructions (Signed)
Ciprofloxacin tablets What is this medicine? CIPROFLOXACIN (sip roe FLOX a sin) is a quinolone antibiotic. It is used to treat certain kinds of bacterial infections. It will not work for colds, flu, or other viral infections. This medicine may be used for other purposes; ask your health care provider or pharmacist if you have questions. COMMON BRAND NAME(S): Cipro What should I tell my health care provider before I take this medicine? They need to know if you have any of these conditions: -bone problems -cerebral disease -joint problems -irregular heartbeat -kidney disease -liver disease -myasthenia gravis -seizure disorder -tendon problems -an unusual or allergic reaction to ciprofloxacin, other antibiotics or medicines, foods, dyes, or preservatives -pregnant or trying to get pregnant -breast-feeding How should I use this medicine? Take this medicine by mouth with a glass of water. Follow the directions on the prescription label. Take your medicine at regular intervals. Do not take your medicine more often than directed. Take all of your medicine as directed even if you think your are better. Do not skip doses or stop your medicine early. You can take this medicine with food or on an empty stomach. It can be taken with a meal that contains dairy or calcium, but do not take it alone with a dairy product, like milk or yogurt or calcium-fortified juice. A special MedGuide will be given to you by the pharmacist with each prescription and refill. Be sure to read this information carefully each time. Talk to your pediatrician regarding the use of this medicine in children. Special care may be needed. Overdosage: If you think you have taken too much of this medicine contact a poison control center or emergency room at once. NOTE: This medicine is only for you. Do not share this medicine with others. What if I miss a dose? If you miss a dose, take it as soon as you can. If it is almost time for  your next dose, take only that dose. Do not take double or extra doses. What may interact with this medicine? Do not take this medicine with any of the following medications: -cisapride -droperidol -terfenadine -tizanidine This medicine may also interact with the following medications: -antacids -caffeine -cyclosporin -didanosine (ddI) buffered tablets or powder -medicines for diabetes -medicines for inflammation like ibuprofen, naproxen -methotrexate -multivitamins -omeprazole -phenytoin -probenecid -sucralfate -theophylline -warfarin This list may not describe all possible interactions. Give your health care provider a list of all the medicines, herbs, non-prescription drugs, or dietary supplements you use. Also tell them if you smoke, drink alcohol, or use illegal drugs. Some items may interact with your medicine. What should I watch for while using this medicine? Tell your doctor or health care professional if your symptoms do not improve. Do not treat diarrhea with over the counter products. Contact your doctor if you have diarrhea that lasts more than 2 days or if it is severe and watery. You may get drowsy or dizzy. Do not drive, use machinery, or do anything that needs mental alertness until you know how this medicine affects you. Do not stand or sit up quickly, especially if you are an older patient. This reduces the risk of dizzy or fainting spells. This medicine can make you more sensitive to the sun. Keep out of the sun. If you cannot avoid being in the sun, wear protective clothing and use sunscreen. Do not use sun lamps or tanning beds/booths. Avoid antacids, aluminum, calcium, iron, magnesium, and zinc products for 6 hours before and 2 hours after taking  a dose of this medicine. What side effects may I notice from receiving this medicine? Side effects that you should report to your doctor or health care professional as soon as possible: - allergic reactions like skin  rash, itching or hives, swelling of the face, lips, or tongue - breathing problems - confusion, nightmares or hallucinations - feeling faint or lightheaded, falls - irregular heartbeat - joint, muscle or tendon pain or swelling - pain or trouble passing urine -persistent headache with or without blurred vision - redness, blistering, peeling or loosening of the skin, including inside the mouth - seizure - unusual pain, numbness, tingling, or weakness Side effects that usually do not require medical attention (report to your doctor or health care professional if they continue or are bothersome): - diarrhea - nausea or stomach upset - white patches or sores in the mouth This list may not describe all possible side effects. Call your doctor for medical advice about side effects. You may report side effects to FDA at 1-800-FDA-1088. Where should I keep my medicine? Keep out of the reach of children. Store at room temperature below 30 degrees C (86 degrees F). Keep container tightly closed. Throw away any unused medicine after the expiration date. NOTE: This sheet is a summary. It may not cover all possible information. If you have questions about this medicine, talk to your doctor, pharmacist, or health care provider.  2014, Elsevier/Gold Standard. (2011-09-06 12:53:06) Urinary Tract Infection Urinary tract infections (UTIs) can develop anywhere along your urinary tract. Your urinary tract is your body's drainage system for removing wastes and extra water. Your urinary tract includes two kidneys, two ureters, a bladder, and a urethra. Your kidneys are a pair of bean-shaped organs. Each kidney is about the size of your fist. They are located below your ribs, one on each side of your spine. CAUSES Infections are caused by microbes, which are microscopic organisms, including fungi, viruses, and bacteria. These organisms are so small that they can only be seen through a microscope.  Bacteria are the microbes that most commonly cause UTIs. SYMPTOMS  Symptoms of UTIs may vary by age and gender of the patient and by the location of the infection. Symptoms in young women typically include a frequent and intense urge to urinate and a painful, burning feeling in the bladder or urethra during urination. Older women and men are more likely to be tired, shaky, and weak and have muscle aches and abdominal pain. A fever may mean the infection is in your kidneys. Other symptoms of a kidney infection include pain in your back or sides below the ribs, nausea, and vomiting. DIAGNOSIS To diagnose a UTI, your caregiver will ask you about your symptoms. Your caregiver also will ask to provide a urine sample. The urine sample will be tested for bacteria and white blood cells. White blood cells are made by your body to help fight infection. TREATMENT  Typically, UTIs can be treated with medication. Because most UTIs are caused by a bacterial infection, they usually can be treated with the use of antibiotics. The choice of antibiotic and length of treatment depend on your symptoms and the type of bacteria causing your infection. HOME CARE INSTRUCTIONS  If you were prescribed antibiotics, take them exactly as your caregiver instructs you. Finish the medication even if you feel better after you have only taken some of the medication.  Drink enough water and fluids to keep your urine clear or pale yellow.  Avoid caffeine, tea, and carbonated  beverages. They tend to irritate your bladder.  Empty your bladder often. Avoid holding urine for long periods of time.  Empty your bladder before and after sexual intercourse.  After a bowel movement, women should cleanse from front to back. Use each tissue only once. SEEK MEDICAL CARE IF:   You have back pain.  You develop a fever.  Your symptoms do not begin to resolve within 3 days. SEEK IMMEDIATE MEDICAL CARE IF:   You have severe back pain or  lower abdominal pain.  You develop chills.  You have nausea or vomiting.  You have continued burning or discomfort with urination. MAKE SURE YOU:   Understand these instructions.  Will watch your condition.  Will get help right away if you are not doing well or get worse. Document Released: 05/29/2005 Document Revised: 02/18/2012 Document Reviewed: 09/27/2011 [facility] Patient Information 2014 .

## 2013-10-27 ENCOUNTER — Telehealth: Payer: Self-pay | Admitting: Internal Medicine

## 2013-10-27 ENCOUNTER — Other Ambulatory Visit: Payer: Self-pay | Admitting: Gynecology

## 2013-10-27 LAB — URINE CULTURE
COLONY COUNT: NO GROWTH
ORGANISM ID, BACTERIA: NO GROWTH

## 2013-10-27 NOTE — Telephone Encounter (Signed)
Pt has colonoscopy scheduled w/ [facility] (dr Buccini) on mon. Pt does not feel comfortable going back there and would like to sch a colonscopy in the [place] [facility]. would like to sch asap.  pls advise

## 2013-10-27 NOTE — Telephone Encounter (Signed)
Noted  

## 2013-10-27 NOTE — Telephone Encounter (Signed)
Sent to [facility] work que, they will contact pt directly to schedule. Pt aware.

## 2013-10-27 NOTE — Telephone Encounter (Signed)
Janace Hoard, can you take care of this for pt. See message from pt.

## 2013-10-28 ENCOUNTER — Ambulatory Visit: Payer: Commercial Managed Care - HMO | Admitting: Gynecology

## 2013-10-28 ENCOUNTER — Other Ambulatory Visit: Payer: Commercial Managed Care - HMO

## 2013-11-01 ENCOUNTER — Encounter: Payer: Self-pay | Admitting: Internal Medicine

## 2013-11-08 ENCOUNTER — Ambulatory Visit (AMBULATORY_SURGERY_CENTER): Payer: Self-pay | Admitting: *Deleted

## 2013-11-08 ENCOUNTER — Telehealth: Payer: Self-pay | Admitting: *Deleted

## 2013-11-08 VITALS — Ht 66.0 in | Wt 141.0 lb

## 2013-11-08 DIAGNOSIS — Z800 Family history of malignant neoplasm of digestive organs: Secondary | ICD-10-CM

## 2013-11-08 DIAGNOSIS — Z860100 Personal history of colon polyps, unspecified: Secondary | ICD-10-CM

## 2013-11-08 MED ORDER — MOVIPREP 100 G PO SOLR: ORAL | Status: DC

## 2013-11-08 NOTE — Progress Notes (Signed)
Patient denies any allergies to eggs or soy. Patient denies any problems with anesthesia. Release of information form signed by patient. Patient had previous colonoscopy with Dr.Buccini, hx colon polyps 2006. Form given to [employer].

## 2013-11-08 NOTE — Telephone Encounter (Signed)
Noted  

## 2013-11-08 NOTE — Telephone Encounter (Signed)
Patient states last colonoscopy was with Dr.Buccini. Patient does has hx colon polyps 2006, patient also has strong family hx colon CA (son(age 74),mother,brother,aunt,nephew, grandmother). Patient c/o abd. pain left side. Release of information form filled out and signed by patient, given to Charlett Lango.

## 2013-11-17 ENCOUNTER — Telehealth: Payer: Self-pay | Admitting: Internal Medicine

## 2013-11-17 DIAGNOSIS — N390 Urinary tract infection, site not specified: Secondary | ICD-10-CM

## 2013-11-17 NOTE — Telephone Encounter (Signed)
Spoke to pt told her order done for referral to Urology and someone will be contacting her. Pt verbalized understanding.

## 2013-11-17 NOTE — Telephone Encounter (Signed)
Pt is requesting a referral to [facility] or any urologist in the [place] [facility] in [place], pt states dr. Raliegh Ip had sent her to a urologist in [place] and she is not pleased with the service she was getting there. Pt is still experiencing bladder infections.

## 2013-11-17 NOTE — Telephone Encounter (Signed)
Okay to new referral for Urologist?

## 2013-11-17 NOTE — Telephone Encounter (Signed)
ok 

## 2013-11-19 ENCOUNTER — Telehealth: Payer: Self-pay

## 2013-11-19 NOTE — Telephone Encounter (Signed)
Spoke with pt to let her know her records from [place] had been received and reviewed (by Dr. Ardis Hughs, Dr. Henrene Pastor was not in office) and she could go ahead and prep for her Monday procedure.  Patient agreed and understood.

## 2013-11-22 ENCOUNTER — Encounter: Payer: Self-pay | Admitting: Internal Medicine

## 2013-11-22 ENCOUNTER — Ambulatory Visit (AMBULATORY_SURGERY_CENTER): Payer: Medicare HMO | Admitting: Internal Medicine

## 2013-11-22 VITALS — BP 156/88 | HR 63 | Temp 97.0°F | Resp 19 | Ht 66.0 in | Wt 141.0 lb

## 2013-11-22 DIAGNOSIS — Z860100 Personal history of colon polyps, unspecified: Secondary | ICD-10-CM

## 2013-11-22 DIAGNOSIS — D126 Benign neoplasm of colon, unspecified: Secondary | ICD-10-CM

## 2013-11-22 MED ORDER — SODIUM CHLORIDE 0.9 % IV SOLN: 500.0000 mL | INTRAVENOUS | Status: DC

## 2013-11-22 NOTE — Op Note (Signed)
[place]  [address] [place], [address]   COLONOSCOPY PROCEDURE REPORT  PATIENT: Vicki, Mcclain  MR#: [id number] BIRTHDATE: [birth year] , 38  yrs. old GENDER: Female ENDOSCOPIST: Eustace Quail, MD REFERRED BY:.  [facility] PROCEDURE DATE:  11/22/2013 PROCEDURE:   Colonoscopy with snare polypectomy x2 First Screening Colonoscopy - Avg.  risk and is 50 yrs.  old or older - No.  Prior Negative Screening - Now for repeat screening. N/A  History of Adenoma - Now for follow-up colonoscopy & has been > or = to 3 yrs.  Yes hx of adenoma.  Has been 3 or more years since last colonoscopy.  Polyps Removed Today? Yes. ASA CLASS:   Class II INDICATIONS:Patient's immediate family history of colon cancer (mother 15; son 47)and Patient's personal history of adenomatous colon polyps(prior examinations in 2002 and 2006 with [facility], adenomatous polyps). MEDICATIONS: MAC sedation, administered by CRNA and propofol (Diprivan) 200mg  IV DESCRIPTION OF PROCEDURE:   After the risks benefits and alternatives of the procedure were thoroughly explained, informed consent was obtained.  A digital rectal exam revealed no abnormalities of the rectum.   The [facility] [id number]  endoscope was introduced through the anus and advanced to the cecum, which was identified by both the appendix and ileocecal valve. No adverse events experienced.   The quality of the prep was excellent, using MoviPrep  The instrument was then slowly withdrawn as the colon was fully examined.  COLON FINDINGS: Two diminutive polyps were found in the ascending colon.  A polypectomy was performed with a cold snare.  The resection was complete and the polyp tissue was completely retrieved.   Mild diverticulosis was noted in the sigmoid colon. The colon mucosa was otherwise normal.  Retroflexed views revealed no abnormalities. Prior evidence of rectal surgery with scar and suture. The time to cecum=3 minutes 52  secs.  Withdrawal time=11 minutes 30 secs.  The scope was withdrawn and the procedure completed. COMPLICATIONS: There were no complications.  ENDOSCOPIC IMPRESSION: 1.   Two diminutive polyps were found in the ascending colon; polypectomy was performed with a cold snare 2.   Mild diverticulosis was noted in the sigmoid colon 3.   The colon mucosa was otherwise normal  RECOMMENDATIONS: 1. Follow up colonoscopy in 5 years   eSigned:  Eustace Quail, MD 11/22/2013 10:59 AM   cc: Marletta Lor, MD and The Patient

## 2013-11-22 NOTE — Patient Instructions (Signed)
YOU HAD AN ENDOSCOPIC PROCEDURE TODAY AT THE [facility]: Refer to the procedure report that was given to you for any specific questions about what was found during the examination.  If the procedure report does not answer your questions, please call your gastroenterologist to clarify.  If you requested that your care partner not be given the details of your procedure findings, then the procedure report has been included in a sealed envelope for you to review at your convenience later.  YOU SHOULD EXPECT: Some feelings of bloating in the abdomen. Passage of more gas than usual.  Walking can help get rid of the air that was put into your GI tract during the procedure and reduce the bloating. If you had a lower endoscopy (such as a colonoscopy or flexible sigmoidoscopy) you may notice spotting of blood in your stool or on the toilet paper. If you underwent a bowel prep for your procedure, then you may not have a normal bowel movement for a few days.  DIET: Your first meal following the procedure should be a light meal and then it is ok to progress to your normal diet.  A half-sandwich or bowl of soup is an example of a good first meal.  Heavy or fried foods are harder to digest and may make you feel nauseous or bloated.  Likewise meals heavy in dairy and vegetables can cause extra gas to form and this can also increase the bloating.  Drink plenty of fluids but you should avoid alcoholic beverages for 24 hours.  ACTIVITY: Your care partner should take you home directly after the procedure.  You should plan to take it easy, moving slowly for the rest of the day.  You can resume normal activity the day after the procedure however you should NOT DRIVE or use heavy machinery for 24 hours (because of the sedation medicines used during the test).    SYMPTOMS TO REPORT IMMEDIATELY: A gastroenterologist can be reached at any hour.  During normal business hours, 8:30 AM to 5:00 PM Monday through Friday,  call [phone].  After hours and on weekends, please call the GI answering service at [phone] who will take a message and have the physician on call contact you.   Following lower endoscopy (colonoscopy or flexible sigmoidoscopy):  Excessive amounts of blood in the stool  Significant tenderness or worsening of abdominal pains  Swelling of the abdomen that is new, acute  Fever of 100F or higher    FOLLOW UP: If any biopsies were taken you will be contacted by phone or by letter within the next 1-3 weeks.  Call your gastroenterologist if you have not heard about the biopsies in 3 weeks.  Our staff will call the home number listed on your records the next business day following your procedure to check on you and address any questions or concerns that you may have at that time regarding the information given to you following your procedure. This is a courtesy call and so if there is no answer at the home number and we have not heard from you through the emergency physician on call, we will assume that you have returned to your regular daily activities without incident.  SIGNATURES/CONFIDENTIALITY: You and/or your care partner have signed paperwork which will be entered into your electronic medical record.  These signatures attest to the fact that that the information above on your After Visit Summary has been reviewed and is understood.  Full responsibility of the confidentiality   of this discharge information lies with you and/or your care-partner.   Polyp, diverticulosis and high fiber diet information given.  Recall colonscopy 5 years-2020

## 2013-11-22 NOTE — Progress Notes (Signed)
Procedure ends, to recovery, report given and VSS. 

## 2013-11-22 NOTE — Progress Notes (Signed)
Called to room to assist during endoscopic procedure.  Patient ID and intended procedure confirmed with present staff. Received instructions for my participation in the procedure from the performing physician.  

## 2013-11-23 ENCOUNTER — Telehealth: Payer: Self-pay | Admitting: *Deleted

## 2013-11-23 NOTE — Telephone Encounter (Signed)
  Follow up Call-  Call back number 11/22/2013  Post procedure Call Back phone  # [phone]  Permission to leave phone message Yes     Patient questions:  Do you have a fever, pain , or abdominal swelling? no Pain Score  0 *  Have you tolerated food without any problems? yes  Have you been able to return to your normal activities? yes  Do you have any questions about your discharge instructions: Diet   no Medications  no Follow up visit  no  Do you have questions or concerns about your Care? no  Actions: * If pain score is 4 or above: No action needed, pain <4.

## 2013-11-25 ENCOUNTER — Encounter: Payer: Self-pay | Admitting: Internal Medicine

## 2013-11-29 ENCOUNTER — Telehealth: Payer: Self-pay | Admitting: Internal Medicine

## 2013-11-29 NOTE — Telephone Encounter (Signed)
Rec'd from [place] forward 16 pages to Dr.Perry

## 2014-01-14 ENCOUNTER — Telehealth: Payer: Self-pay | Admitting: Internal Medicine

## 2014-01-14 NOTE — Telephone Encounter (Signed)
Pt states she is still getting a bill from the breast center for her mammogram. ([facility]) This was done in Jan. Pt states her insurance requires a referral. Pt was told this would be done, but she still keeps getting a bill.

## 2014-01-16 ENCOUNTER — Other Ambulatory Visit: Payer: Self-pay | Admitting: Internal Medicine

## 2014-02-18 ENCOUNTER — Other Ambulatory Visit: Payer: Self-pay | Admitting: Internal Medicine

## 2014-04-14 ENCOUNTER — Telehealth: Payer: Self-pay | Admitting: *Deleted

## 2014-04-14 NOTE — Telephone Encounter (Signed)
Pt left message in voicemail with questions about estrace cream. I left message for her to call me back.

## 2014-05-04 ENCOUNTER — Telehealth: Payer: Self-pay | Admitting: *Deleted

## 2014-05-04 NOTE — Telephone Encounter (Signed)
Pt called requesting a coupon for estrace vaginal cream 0.1%. Left up front for pick up.

## 2014-05-06 ENCOUNTER — Other Ambulatory Visit: Payer: Self-pay | Admitting: Gynecology

## 2014-05-06 ENCOUNTER — Telehealth: Payer: Self-pay

## 2014-05-06 MED ORDER — ESTRADIOL 0.1 MG/GM VA CREA: 0.5000 g | TOPICAL_CREAM | VAGINAL | Status: DC

## 2014-05-06 NOTE — Telephone Encounter (Signed)
Patient called and asked me to mail her Rx for her Estrace Cm. Rx mailed.

## 2014-05-16 ENCOUNTER — Other Ambulatory Visit: Payer: Self-pay | Admitting: Internal Medicine

## 2014-05-17 ENCOUNTER — Other Ambulatory Visit: Payer: Self-pay | Admitting: Internal Medicine

## 2014-06-15 ENCOUNTER — Telehealth: Payer: Self-pay | Admitting: Internal Medicine

## 2014-06-15 NOTE — Telephone Encounter (Signed)
[name], put 2 appointments together to make a CPE slot.

## 2014-06-15 NOTE — Telephone Encounter (Addendum)
Pt has been scheduled.  °

## 2014-06-15 NOTE — Telephone Encounter (Signed)
Pt was hoping for a cpe in nov. First avail is [place]. So pt want to know if she could have thyroid labs done prior to jan bc she feels like her hair may be falling out more than normal amount. May need adjust in levothyroxine (SYNTHROID, LEVOTHROID) 75 MCG tablet

## 2014-06-15 NOTE — Telephone Encounter (Signed)
Pt needs referral for her obgyn on 11/23  Dr Araceli Bouche

## 2014-07-04 ENCOUNTER — Encounter: Payer: Self-pay | Admitting: Internal Medicine

## 2014-07-18 ENCOUNTER — Other Ambulatory Visit: Payer: Commercial Managed Care - HMO

## 2014-07-25 ENCOUNTER — Encounter: Payer: Self-pay | Admitting: Gynecology

## 2014-07-25 ENCOUNTER — Telehealth: Payer: Self-pay | Admitting: Internal Medicine

## 2014-07-25 ENCOUNTER — Ambulatory Visit (INDEPENDENT_AMBULATORY_CARE_PROVIDER_SITE_OTHER): Payer: Medicare HMO | Admitting: Internal Medicine

## 2014-07-25 ENCOUNTER — Encounter: Payer: Self-pay | Admitting: Internal Medicine

## 2014-07-25 ENCOUNTER — Ambulatory Visit (INDEPENDENT_AMBULATORY_CARE_PROVIDER_SITE_OTHER): Payer: Commercial Managed Care - HMO | Admitting: *Deleted

## 2014-07-25 ENCOUNTER — Ambulatory Visit (INDEPENDENT_AMBULATORY_CARE_PROVIDER_SITE_OTHER): Payer: Commercial Managed Care - HMO | Admitting: Gynecology

## 2014-07-25 VITALS — BP 132/88 | Ht 65.5 in | Wt 143.0 lb

## 2014-07-25 DIAGNOSIS — Z79890 Hormone replacement therapy (postmenopausal): Secondary | ICD-10-CM

## 2014-07-25 DIAGNOSIS — E7800 Pure hypercholesterolemia, unspecified: Secondary | ICD-10-CM

## 2014-07-25 DIAGNOSIS — Z23 Encounter for immunization: Secondary | ICD-10-CM

## 2014-07-25 DIAGNOSIS — N83202 Unspecified ovarian cyst, left side: Secondary | ICD-10-CM

## 2014-07-25 DIAGNOSIS — Z800 Family history of malignant neoplasm of digestive organs: Secondary | ICD-10-CM

## 2014-07-25 DIAGNOSIS — M8580 Other specified disorders of bone density and structure, unspecified site: Secondary | ICD-10-CM

## 2014-07-25 DIAGNOSIS — N8320 Unspecified ovarian cysts: Secondary | ICD-10-CM

## 2014-07-25 DIAGNOSIS — Z86010 Personal history of colonic polyps: Secondary | ICD-10-CM

## 2014-07-25 DIAGNOSIS — E039 Hypothyroidism, unspecified: Secondary | ICD-10-CM

## 2014-07-25 DIAGNOSIS — Z0000 Encounter for general adult medical examination without abnormal findings: Secondary | ICD-10-CM

## 2014-07-25 DIAGNOSIS — N952 Postmenopausal atrophic vaginitis: Secondary | ICD-10-CM

## 2014-07-25 LAB — TSH: TSH: 1 u[IU]/mL (ref 0.35–4.50)

## 2014-07-25 LAB — CBC WITH DIFFERENTIAL/PLATELET
BASOS ABS: 0 10*3/uL (ref 0.0–0.1)
Basophils Relative: 0.5 % (ref 0.0–3.0)
EOS ABS: 0.1 10*3/uL (ref 0.0–0.7)
Eosinophils Relative: 1.4 % (ref 0.0–5.0)
HCT: 43.2 % (ref 36.0–46.0)
Hemoglobin: 14.3 g/dL (ref 12.0–15.0)
LYMPHS PCT: 24.4 % (ref 12.0–46.0)
Lymphs Abs: 1.3 10*3/uL (ref 0.7–4.0)
MCHC: 33 g/dL (ref 30.0–36.0)
MCV: 91.8 fl (ref 78.0–100.0)
Monocytes Absolute: 0.3 10*3/uL (ref 0.1–1.0)
Monocytes Relative: 5.4 % (ref 3.0–12.0)
NEUTROS PCT: 68.3 % (ref 43.0–77.0)
Neutro Abs: 3.7 10*3/uL (ref 1.4–7.7)
Platelets: 197 10*3/uL (ref 150.0–400.0)
RBC: 4.7 Mil/uL (ref 3.87–5.11)
RDW: 12.8 % (ref 11.5–15.5)
WBC: 5.5 10*3/uL (ref 4.0–10.5)

## 2014-07-25 LAB — LIPID PANEL
CHOL/HDL RATIO: 3
Cholesterol: 151 mg/dL (ref 0–200)
HDL: 54.8 mg/dL (ref >39.00)
LDL Cholesterol: 79 mg/dL (ref 0–99)
NonHDL: 96.2
TRIGLYCERIDES: 87 mg/dL (ref 0.0–149.0)
VLDL: 17.4 mg/dL (ref 0.0–40.0)

## 2014-07-25 LAB — COMPREHENSIVE METABOLIC PANEL
ALT: 18 U/L (ref 0–35)
AST: 25 U/L (ref 0–37)
Albumin: 4.4 g/dL (ref 3.5–5.2)
Alkaline Phosphatase: 55 U/L (ref 39–117)
BUN: 12 mg/dL (ref 6–23)
CALCIUM: 9.5 mg/dL (ref 8.4–10.5)
CO2: 27 meq/L (ref 19–32)
Chloride: 104 mEq/L (ref 96–112)
Creatinine, Ser: 0.9 mg/dL (ref 0.4–1.2)
GFR: 63.28 mL/min (ref >60.00)
Glucose, Bld: 74 mg/dL (ref 70–99)
POTASSIUM: 4.5 meq/L (ref 3.5–5.1)
Sodium: 140 mEq/L (ref 135–145)
Total Bilirubin: 0.8 mg/dL (ref 0.2–1.2)
Total Protein: 7 g/dL (ref 6.0–8.3)

## 2014-07-25 MED ORDER — ALPRAZOLAM 0.5 MG PO TABS: ORAL_TABLET | ORAL | Status: DC

## 2014-07-25 MED ORDER — LEVOTHYROXINE SODIUM 75 MCG PO TABS: ORAL_TABLET | ORAL | Status: DC

## 2014-07-25 MED ORDER — SIMVASTATIN 20 MG PO TABS: 20.0000 mg | ORAL_TABLET | Freq: Every day | ORAL | Status: DC

## 2014-07-25 NOTE — Telephone Encounter (Signed)
done Outpatient Authorization #[id number]  07/25/2014 - 01/21/2015 [name]

## 2014-07-25 NOTE — Patient Instructions (Addendum)
Influenza Virus Vaccine injection (Fluarix) What is this medicine? INFLUENZA VIRUS VACCINE (in floo EN zuh VAHY ruhs vak SEEN) helps to reduce the risk of getting influenza also known as the flu. This medicine may be used for other purposes; ask your health care provider or pharmacist if you have questions. COMMON BRAND NAME(S): Fluarix, Fluzone What should I tell my health care provider before I take this medicine? They need to know if you have any of these conditions: -bleeding disorder like hemophilia -fever or infection -Guillain-Barre syndrome or other neurological problems -immune system problems -infection with the human immunodeficiency virus (HIV) or AIDS -low blood platelet counts -multiple sclerosis -an unusual or allergic reaction to influenza virus vaccine, eggs, chicken proteins, latex, gentamicin, other medicines, foods, dyes or preservatives -pregnant or trying to get pregnant -breast-feeding How should I use this medicine? This vaccine is for injection into a muscle. It is given by a health care professional. A copy of Vaccine Information Statements will be given before each vaccination. Read this sheet carefully each time. The sheet may change frequently. Talk to your pediatrician regarding the use of this medicine in children. Special care may be needed. Overdosage: If you think you have taken too much of this medicine contact a poison control center or emergency room at once. NOTE: This medicine is only for you. Do not share this medicine with others. What if I miss a dose? This does not apply. What may interact with this medicine? -chemotherapy or radiation therapy -medicines that lower your immune system like etanercept, anakinra, infliximab, and adalimumab -medicines that treat or prevent blood clots like warfarin -phenytoin -steroid medicines like prednisone or cortisone -theophylline -vaccines This list may not describe all possible interactions. Give your  health care provider a list of all the medicines, herbs, non-prescription drugs, or dietary supplements you use. Also tell them if you smoke, drink alcohol, or use illegal drugs. Some items may interact with your medicine. What should I watch for while using this medicine? Report any side effects that do not go away within 3 days to your doctor or health care professional. Call your health care provider if any unusual symptoms occur within 6 weeks of receiving this vaccine. You may still catch the flu, but the illness is not usually as bad. You cannot get the flu from the vaccine. The vaccine will not protect against colds or other illnesses that may cause fever. The vaccine is needed every year. What side effects may I notice from receiving this medicine? Side effects that you should report to your doctor or health care professional as soon as possible: -allergic reactions like skin rash, itching or hives, swelling of the face, lips, or tongue Side effects that usually do not require medical attention (report to your doctor or health care professional if they continue or are bothersome): -fever -headache -muscle aches and pains -pain, tenderness, redness, or swelling at site where injected -weak or tired This list may not describe all possible side effects. Call your doctor for medical advice about side effects. You may report side effects to FDA at 1-800-FDA-1088. Where should I keep my medicine? This vaccine is only given in a clinic, pharmacy, doctor's office, or other health care setting and will not be stored at home. NOTE: This sheet is a summary. It may not cover all possible information. If you have questions about this medicine, talk to your doctor, pharmacist, or health care provider.  2015, Elsevier/Gold Standard. (2008-03-16 09:30:40) Bone Densitometry Bone densitometry is  a special X-ray that measures your bone density and can be used to help predict your risk of bone fractures.  This test is used to determine bone mineral content and density to diagnose osteoporosis. Osteoporosis is the loss of bone that may cause the bone to become weak. Osteoporosis commonly occurs in women entering menopause. However, it may be found in men and in people with other diseases. PREPARATION FOR TEST No preparation necessary. WHO SHOULD BE TESTED?  All women older than 5.  Postmenopausal women (50 to 61) with risk factors for osteoporosis.  People with a previous fracture caused by normal activities.  People with a small body frame (less than 127 poundsor a body mass index [BMI] of less than 21).  People who have a parent with a hip fracture or history of osteoporosis.  People who smoke.  People who have rheumatoid arthritis.  Anyone who engages in excessive alcohol use (more than 3 drinks most days).  Women who experience early menopause. WHEN SHOULD YOU BE RETESTED? Current guidelines suggest that you should wait at least 2 years before doing a bone density test again if your first test was normal.Recent studies indicated that women with normal bone density may be able to wait a few years before needing to repeat a bone density test. You should discuss this with your caregiver.  NORMAL FINDINGS   Normal: less than standard deviation below normal (greater than -1).  Osteopenia: 1 to 2.5 standard deviations below normal (-1 to -2.5).  Osteoporosis: greater than 2.5 standard deviations below normal (less than -2.5). Test results are reported as a "T score" and a "Z score."The T score is a number that compares your bone density with the bone density of healthy, young women.The Z score is a number that compares your bone density with the scores of women who are the same age, gender, and race.  Ranges for normal findings may vary among different laboratories and hospitals. You should always check with your doctor after having lab work or other tests done to discuss the  meaning of your test results and whether your values are considered within normal limits. MEANING OF TEST  Your caregiver will go over the test results with you and discuss the importance and meaning of your results, as well as treatment options and the need for additional tests if necessary. OBTAINING THE TEST RESULTS It is your responsibility to obtain your test results. Ask the lab or department performing the test when and how you will get your results. Document Released: 09/10/2004 Document Revised: 11/11/2011 Document Reviewed: 10/03/2010 [facility] Patient Information 2015 [place], [place]. This information is not intended to replace advice given to you by your health care provider. Make sure you discuss any questions you have with your health care provider.  Ovarian Cyst An ovarian cyst is a fluid-filled sac that forms on an ovary. The ovaries are small organs that produce eggs in women. Various types of cysts can form on the ovaries. Most are not cancerous. Many do not cause problems, and they often go away on their own. Some may cause symptoms and require treatment. Common types of ovarian cysts include:  Functional cysts--These cysts may occur every month during the menstrual cycle. This is normal. The cysts usually go away with the next menstrual cycle if the woman does not get pregnant. Usually, there are no symptoms with a functional cyst.  Endometrioma cysts--These cysts form from the tissue that lines the uterus. They are also called "chocolate cysts" because  they become filled with blood that turns brown. This type of cyst can cause pain in the lower abdomen during intercourse and with your menstrual period.  Cystadenoma cysts--This type develops from the cells on the outside of the ovary. These cysts can get very big and cause lower abdomen pain and pain with intercourse. This type of cyst can twist on itself, cut off its blood supply, and cause severe pain. It can also easily rupture  and cause a lot of pain.  Dermoid cysts--This type of cyst is sometimes found in both ovaries. These cysts may contain different kinds of body tissue, such as skin, teeth, hair, or cartilage. They usually do not cause symptoms unless they get very big.  Theca lutein cysts--These cysts occur when too much of a certain hormone (human chorionic gonadotropin) is produced and overstimulates the ovaries to produce an egg. This is most common after procedures used to assist with the conception of a baby (in vitro fertilization). CAUSES   Fertility drugs can cause a condition in which multiple large cysts are formed on the ovaries. This is called ovarian hyperstimulation syndrome.  A condition called polycystic ovary syndrome can cause hormonal imbalances that can lead to nonfunctional ovarian cysts. SIGNS AND SYMPTOMS  Many ovarian cysts do not cause symptoms. If symptoms are present, they may include:  Pelvic pain or pressure.  Pain in the lower abdomen.  Pain during sexual intercourse.  Increasing girth (swelling) of the abdomen.  Abnormal menstrual periods.  Increasing pain with menstrual periods.  Stopping having menstrual periods without being pregnant. DIAGNOSIS  These cysts are commonly found during a routine or annual pelvic exam. Tests may be ordered to find out more about the cyst. These tests may include:  Ultrasound.  X-ray of the pelvis.  CT scan.  MRI.  Blood tests. TREATMENT  Many ovarian cysts go away on their own without treatment. Your health care provider may want to check your cyst regularly for 2-3 months to see if it changes. For women in menopause, it is particularly important to monitor a cyst closely because of the higher rate of ovarian cancer in menopausal women. When treatment is needed, it may include any of the following:  A procedure to drain the cyst (aspiration). This may be done using a long needle and ultrasound. It can also be done through a  laparoscopic procedure. This involves using a thin, lighted tube with a tiny camera on the end (laparoscope) inserted through a small incision.  Surgery to remove the whole cyst. This may be done using laparoscopic surgery or an open surgery involving a larger incision in the lower abdomen.  Hormone treatment or birth control pills. These methods are sometimes used to help dissolve a cyst. HOME CARE INSTRUCTIONS   Only take over-the-counter or prescription medicines as directed by your health care provider.  Follow up with your health care provider as directed.  Get regular pelvic exams and Pap tests. SEEK MEDICAL CARE IF:   Your periods are late, irregular, or painful, or they stop.  Your pelvic pain or abdominal pain does not go away.  Your abdomen becomes larger or swollen.  You have pressure on your bladder or trouble emptying your bladder completely.  You have pain during sexual intercourse.  You have feelings of fullness, pressure, or discomfort in your stomach.  You lose weight for no apparent reason.  You feel generally ill.  You become constipated.  You lose your appetite.  You develop acne.  You  have an increase in body and facial hair.  You are gaining weight, without changing your exercise and eating habits.  You think you are pregnant. SEEK IMMEDIATE MEDICAL CARE IF:   You have increasing abdominal pain.  You feel sick to your stomach (nauseous), and you throw up (vomit).  You develop a fever that comes on suddenly.  You have abdominal pain during a bowel movement.  Your menstrual periods become heavier than usual. MAKE SURE YOU:  Understand these instructions.  Will watch your condition.  Will get help right away if you are not doing well or get worse. Document Released: 08/19/2005 Document Revised: 08/24/2013 Document Reviewed: 04/26/2013 [facility] Patient Information 2015 [place], [place]. This information is not intended to replace advice  given to you by your health care provider. Make sure you discuss any questions you have with your health care provider. Transvaginal Ultrasound Transvaginal ultrasound is a pelvic ultrasound, using a metal probe that is placed in the vagina, to look at a women's female organs. Transvaginal ultrasound is a method of seeing inside the pelvis of a woman. The ultrasound machine sends out sound waves from the transducer (probe). These sound waves bounce off body structures (like an echo) to create a picture. The picture shows up on a monitor. It is called transvaginal because the probe is inserted into the vagina. There should be very little discomfort from the vaginal probe. This test can also be used during pregnancy. Endovaginal ultrasound is another name for a transvaginal ultrasound. In a transabdominal ultrasound, the probe is placed on the outside of the belly. This method gives pictures that are lower quality than pictures from the transvaginal technique. Transvaginal ultrasound is used to look for problems of the female genital tract. Some such problems include:  Infertility problems.  Congenital (birth defect) malformations of the uterus and ovaries.  Tumors in the uterus.  Abnormal bleeding.  Ovarian tumors and cysts.  Abscess (inflamed tissue around pus) in the pelvis.  Unexplained abdominal or pelvic pain.  Pelvic infection. DURING PREGNANCY, TRANSVAGINAL ULTRASOUND MAY BE USED TO LOOK AT:  Normal pregnancy.  Ectopic pregnancy (pregnancy outside the uterus).  Fetal heartbeat.  Abnormalities in the pelvis, that are not seen well with transabdominal ultrasound.  Suspected twins or multiples.  Impending miscarriage.  Problems with the cervix (incompetent cervix, not able to stay closed and hold the baby).  When doing an amniocentesis (removing fluid from the pregnancy sac, for testing).  Looking for abnormalities of the baby.  Checking the growth, development, and age of  the fetus.  Measuring the amount of fluid in the amniotic sac.  When doing an external version of the baby (moving baby into correct position).  Evaluating the baby for problems in high risk pregnancies (biophysical profile).  Suspected fetal demise (death). Sometimes a special ultrasound method called Saline Infusion Sonography (SIS) is used for a more accurate look at the uterus. Sterile saline (salt water) is injected into the uterus of non-pregnant patients to see the inside of the uterus better. SIS is not used on pregnant women. The vaginal probe can also assist in obtaining biopsies of abnormal areas, in draining fluid from cysts on the ovary, and in finding IUDs (intrauterine device, birth control) that cannot be located. PREPARATION FOR TEST A transvaginal ultrasound is done with the bladder empty. The transabdominal ultrasound is done with your bladder full. You may be asked to drink several glasses of water before that exam. Sometimes, a transabdominal ultrasound is done just after  a transvaginal ultrasound, to look at organs in your abdomen. PROCEDURE  You will lie down on a table, with your knees bent and your feet in foot holders. The probe is covered with a condom. A sterile lubricant is put into the vagina and on the probe. The lubricant helps transmit the sound waves and avoid irritating the vagina. Your caregiver will move the probe inside the vaginal cavity to scan the pelvic structures. A normal test will show a normal pelvis and normal contents. An abnormal test will show abnormalities of the pelvis, placenta, or baby. ABNORMAL RESULTS MAY BE DUE TO:  Growths or tumors in the:  Uterus.  Ovaries.  Vagina.  Other pelvic structures.  Non-cancerous growths of the uterus and ovaries.  Twisting of the ovary, cutting off blood supply to the ovary (ovarian torsion).  Areas of infection, including:  Pelvic inflammatory disease.  Abscess in the pelvis.  Locating an  IUD. PROBLEMS FOUND IN PREGNANT WOMEN MAY INCLUDE:  Ectopic pregnancy (pregnancy outside the uterus).  Multiple pregnancies.  Early dilation (opening) of the cervix. This may indicate an incompetent cervix and early delivery.  Impending miscarriage.  Fetal death.  Problems with the placenta, including:  Placenta has grown over the opening of the womb (placenta previa).  Placenta has separated early in the womb (placental abruption).  Placenta grows into the muscle of the uterus (placenta accreta).  Tumors of pregnancy, including gestational trophoblastic disease. This is an abnormal pregnancy, with no fetus. The uterus is filled with many grape-like cysts that could sometimes be cancerous.  Incorrect position of the fetus (breech, vertex).  Intrauterine fetal growth retardation (IUGR) (poor growth in the womb).  Fetal abnormalities or infection. RISKS AND COMPLICATIONS There are no known risks to the ultrasound procedure. There is no X-ray used when doing an ultrasound. Document Released: 07/31/2004 Document Revised: 11/11/2011 Document Reviewed: 07/19/2009 [facility] Patient Information 2015 [place], [place]. This information is not intended to replace advice given to you by your health care provider. Make sure you discuss any questions you have with your health care provider.

## 2014-07-25 NOTE — Patient Instructions (Signed)
Take a calcium supplement, plus [phone] units of vitamin D    It is important that you exercise regularly, at least 20 minutes 3 to 4 times per week.  If you develop chest pain or shortness of breath seek  medical attention.  Health Maintenance Adopting a healthy lifestyle and getting preventive care can go a long way to promote health and wellness. Talk with your health care provider about what schedule of regular examinations is right for you. This is a good chance for you to check in with your provider about disease prevention and staying healthy. In between checkups, there are plenty of things you can do on your own. Experts have done a lot of research about which lifestyle changes and preventive measures are most likely to keep you healthy. Ask your health care provider for more information. WEIGHT AND DIET  Eat a healthy diet  Be sure to include plenty of vegetables, fruits, low-fat dairy products, and lean protein.  Do not eat a lot of foods high in solid fats, added sugars, or salt.  Get regular exercise. This is one of the most important things you can do for your health.  Most adults should exercise for at least 150 minutes each week. The exercise should increase your heart rate and make you sweat (moderate-intensity exercise).  Most adults should also do strengthening exercises at least twice a week. This is in addition to the moderate-intensity exercise.  Maintain a healthy weight  Body mass index (BMI) is a measurement that can be used to identify possible weight problems. It estimates body fat based on height and weight. Your health care provider can help determine your BMI and help you achieve or maintain a healthy weight.  For females 84 years of age and older:   A BMI below 18.5 is considered underweight.  A BMI of 18.5 to 24.9 is normal.  A BMI of 25 to 29.9 is considered overweight.  A BMI of 30 and above is considered obese.  Watch levels of cholesterol and  blood lipids  You should start having your blood tested for lipids and cholesterol at 74 years of age, then have this test every 5 years.  You may need to have your cholesterol levels checked more often if:  Your lipid or cholesterol levels are high.  You are older than 74 years of age.  You are at high risk for heart disease.  CANCER SCREENING   Lung Cancer  Lung cancer screening is recommended for adults 41-30 years old who are at high risk for lung cancer because of a history of smoking.  A yearly low-dose CT scan of the lungs is recommended for people who:  Currently smoke.  Have quit within the past 15 years.  Have at least a 30-pack-year history of smoking. A pack year is smoking an average of one pack of cigarettes a day for 1 year.  Yearly screening should continue until it has been 15 years since you quit.  Yearly screening should stop if you develop a health problem that would prevent you from having lung cancer treatment.  Breast Cancer  Practice breast self-awareness. This means understanding how your breasts normally appear and feel.  It also means doing regular breast self-exams. Let your health care provider know about any changes, no matter how small.  If you are in your 20s or 30s, you should have a clinical breast exam (CBE) by a health care provider every 1-3 years as part of a regular health  exam.  If you are 40 or older, have a CBE every year. Also consider having a breast X-ray (mammogram) every year.  If you have a family history of breast cancer, talk to your health care provider about genetic screening.  If you are at high risk for breast cancer, talk to your health care provider about having an MRI and a mammogram every year.  Breast cancer gene (BRCA) assessment is recommended for women who have family members with BRCA-related cancers. BRCA-related cancers include:  Breast.  Ovarian.  Tubal.  Peritoneal cancers.  Results of the  assessment will determine the need for genetic counseling and BRCA1 and BRCA2 testing. Cervical Cancer Routine pelvic examinations to screen for cervical cancer are no longer recommended for nonpregnant women who are considered low risk for cancer of the pelvic organs (ovaries, uterus, and vagina) and who do not have symptoms. A pelvic examination may be necessary if you have symptoms including those associated with pelvic infections. Ask your health care provider if a screening pelvic exam is right for you.   The Pap test is the screening test for cervical cancer for women who are considered at risk.  If you had a hysterectomy for a problem that was not cancer or a condition that could lead to cancer, then you no longer need Pap tests.  If you are older than 65 years, and you have had normal Pap tests for the past 10 years, you no longer need to have Pap tests.  If you have had past treatment for cervical cancer or a condition that could lead to cancer, you need Pap tests and screening for cancer for at least 20 years after your treatment.  If you no longer get a Pap test, assess your risk factors if they change (such as having a new sexual partner). This can affect whether you should start being screened again.  Some women have medical problems that increase their chance of getting cervical cancer. If this is the case for you, your health care provider may recommend more frequent screening and Pap tests.  The human papillomavirus (HPV) test is another test that may be used for cervical cancer screening. The HPV test looks for the virus that can cause cell changes in the cervix. The cells collected during the Pap test can be tested for HPV.  The HPV test can be used to screen women 72 years of age and older. Getting tested for HPV can extend the interval between normal Pap tests from three to five years.  An HPV test also should be used to screen women of any age who have unclear Pap test  results.  After 74 years of age, women should have HPV testing as often as Pap tests.  Colorectal Cancer  This type of cancer can be detected and often prevented.  Routine colorectal cancer screening usually begins at 74 years of age and continues through 74 years of age.  Your health care provider may recommend screening at an earlier age if you have risk factors for colon cancer.  Your health care provider may also recommend using home test kits to check for hidden blood in the stool.  A small camera at the end of a tube can be used to examine your colon directly (sigmoidoscopy or colonoscopy). This is done to check for the earliest forms of colorectal cancer.  Routine screening usually begins at age 9.  Direct examination of the colon should be repeated every 5-10 years through 75 years of  age. However, you may need to be screened more often if early forms of precancerous polyps or small growths are found. Skin Cancer  Check your skin from head to toe regularly.  Tell your health care provider about any new moles or changes in moles, especially if there is a change in a mole's shape or color.  Also tell your health care provider if you have a mole that is larger than the size of a pencil eraser.  Always use sunscreen. Apply sunscreen liberally and repeatedly throughout the day.  Protect yourself by wearing long sleeves, pants, a wide-brimmed hat, and sunglasses whenever you are outside. HEART DISEASE, DIABETES, AND HIGH BLOOD PRESSURE   Have your blood pressure checked at least every 1-2 years. High blood pressure causes heart disease and increases the risk of stroke.  If you are between 98 years and 20 years old, ask your health care provider if you should take aspirin to prevent strokes.  Have regular diabetes screenings. This involves taking a blood sample to check your fasting blood sugar level.  If you are at a normal weight and have a low risk for diabetes, have this  test once every three years after 74 years of age.  If you are overweight and have a high risk for diabetes, consider being tested at a younger age or more often. PREVENTING INFECTION  Hepatitis B  If you have a higher risk for hepatitis B, you should be screened for this virus. You are considered at high risk for hepatitis B if:  You were born in a country where hepatitis B is common. Ask your health care provider which countries are considered high risk.  Your parents were born in a high-risk country, and you have not been immunized against hepatitis B (hepatitis B vaccine).  You have HIV or AIDS.  You use needles to inject street drugs.  You live with someone who has hepatitis B.  You have had sex with someone who has hepatitis B.  You get hemodialysis treatment.  You take certain medicines for conditions, including cancer, organ transplantation, and autoimmune conditions. Hepatitis C  Blood testing is recommended for:  Everyone born from [birth year] through 1966.  Anyone with known risk factors for hepatitis C. Sexually transmitted infections (STIs)  You should be screened for sexually transmitted infections (STIs) including gonorrhea and chlamydia if:  You are sexually active and are younger than 74 years of age.  You are older than 74 years of age and your health care provider tells you that you are at risk for this type of infection.  Your sexual activity has changed since you were last screened and you are at an increased risk for chlamydia or gonorrhea. Ask your health care provider if you are at risk.  If you do not have HIV, but are at risk, it may be recommended that you take a prescription medicine daily to prevent HIV infection. This is called pre-exposure prophylaxis (PrEP). You are considered at risk if:  You are sexually active and do not regularly use condoms or know the HIV status of your partner(s).  You take drugs by injection.  You are sexually active with  a partner who has HIV. Talk with your health care provider about whether you are at high risk of being infected with HIV. If you choose to begin PrEP, you should first be tested for HIV. You should then be tested every 3 months for as long as you are taking PrEP.  PREGNANCY  If you are premenopausal and you may become pregnant, ask your health care provider about preconception counseling.  If you may become pregnant, take 400 to 800 micrograms (mcg) of folic acid every day.  If you want to prevent pregnancy, talk to your health care provider about birth control (contraception). OSTEOPOROSIS AND MENOPAUSE   Osteoporosis is a disease in which the bones lose minerals and strength with aging. This can result in serious bone fractures. Your risk for osteoporosis can be identified using a bone density scan.  If you are 65 years of age or older, or if you are at risk for osteoporosis and fractures, ask your health care provider if you should be screened.  Ask your health care provider whether you should take a calcium or vitamin D supplement to lower your risk for osteoporosis.  Menopause may have certain physical symptoms and risks.  Hormone replacement therapy may reduce some of these symptoms and risks. Talk to your health care provider about whether hormone replacement therapy is right for you.  HOME CARE INSTRUCTIONS   Schedule regular health, dental, and eye exams.  Stay current with your immunizations.   Do not use any tobacco products including cigarettes, chewing tobacco, or electronic cigarettes.  If you are pregnant, do not drink alcohol.  If you are breastfeeding, limit how much and how often you drink alcohol.  Limit alcohol intake to no more than 1 drink per day for nonpregnant women. One drink equals 12 ounces of beer, 5 ounces of wine, or 1 ounces of hard liquor.  Do not use street drugs.  Do not share needles.  Ask your health care provider for help if you need  support or information about quitting drugs.  Tell your health care provider if you often feel depressed.  Tell your health care provider if you have ever been abused or do not feel safe at home. Document Released: 03/04/2011 Document Revised: 01/03/2014 Document Reviewed: 07/21/2013 [employer] Patient Information 2015 [employer]. This information is not intended to replace advice given to you by your health care provider. Make sure you discuss any questions you have with your health care provider.  

## 2014-07-25 NOTE — Progress Notes (Signed)
[name] 16-Dec-1940 [id number]   History:    73 y.o.  for GYN exam and follow-up. Patient was seen last year for the first time as a new patient to the practice. Patient was previously being followed by Dr. Ubaldo Glassing. She is currently taking vaginal estrogen cream twice a week for vaginal atrophy.She has had previous vaginal hysterectomy with 2 bladder suspension in the past. She denies any stress urinary incontinence. Patient has very strong family history of colorectal cancer as follows:  Her son died at age 19 of colon cancer Aunt was diagnosed with colon cancer at the age of 98 Mother history of colon cancer Brother history of colon cancer [place] daughter with colon cancer Nephew died of colon cancer recently.  Review of patient's records indicated when she was seen in October 2014 because of left lower quadrant discomfort an ultrasound had been done which demonstrated a small left ovarian cyst that measured 21 x 19 x 19 mm contralateral ovary normal previous hysterectomy. Normal CA 125. Patient did not return for follow-up ultrasound.  Patient recently saw Dr. Cristina Gong gastroenterologist who removed several benign polyps.  Review of patient's bone density study indicating that was done in 2009 at a different facility where her primary was who has retired. Her bone density T scores as follows: AP -1.5 Left femoral neck -1.3 Right femoral neck -0.8  Patient's vaccines including shingles vaccine are up-to-date. Patient with no past history of abnormal Pap smears reported   Past medical history,surgical history, family history and social history were all reviewed and documented in the EPIC chart.  Gynecologic History No LMP recorded. Patient has had a hysterectomy. Contraception: status post hysterectomy Last Pap: 2014 . Results were: normal Last mammogram: 2015 . Results were: Normal three-dimensional   Obstetric History OB History  Gravida Para Term Preterm AB SAB TAB Ectopic  Multiple Living  4 4        4     # Outcome Date GA Lbr Len/2nd Weight Sex Delivery Anes PTL Lv  4 Para           3 Para           2 Para           1 Para                ROS: A ROS was performed and pertinent positives and negatives are included in the history.  GENERAL: No fevers or chills. HEENT: No change in vision, no earache, sore throat or sinus congestion. NECK: No pain or stiffness. CARDIOVASCULAR: No chest pain or pressure. No palpitations. PULMONARY: No shortness of breath, cough or wheeze. GASTROINTESTINAL: No abdominal pain, nausea, vomiting or diarrhea, melena or bright red blood per rectum. GENITOURINARY: No urinary frequency, urgency, hesitancy or dysuria. MUSCULOSKELETAL: No joint or muscle pain, no back pain, no recent trauma. DERMATOLOGIC: No rash, no itching, no lesions. ENDOCRINE: No polyuria, polydipsia, no heat or cold intolerance. No recent change in weight. HEMATOLOGICAL: No anemia or easy bruising or bleeding. NEUROLOGIC: No headache, seizures, numbness, tingling or weakness. PSYCHIATRIC: No depression, no loss of interest in normal activity or change in sleep pattern.     Exam: chaperone present  BP 132/88 mmHg  Ht 5' 5.5" (1.664 m)  Wt 143 lb (64.864 kg)  BMI 23.43 kg/m2  Body mass index is 23.43 kg/(m^2).  General appearance : Well developed well nourished female. No acute distress HEENT: Neck supple, trachea midline, no carotid bruits, no thyroidmegaly Lungs:  Clear to auscultation, no rhonchi or wheezes, or rib retractions  Heart: Regular rate and rhythm, no murmurs or gallops Breast:Examined in sitting and supine position were symmetrical in appearance, no palpable masses or tenderness,  no skin retraction, no nipple inversion, no nipple discharge, no skin discoloration, no axillary or supraclavicular lymphadenopathy Abdomen: no palpable masses or tenderness, no rebound or guarding Extremities: no edema or skin discoloration or  tenderness  Pelvic:  Bartholin, Urethra, Skene Glands: Within normal limits             Vagina: No gross lesions or discharge, Vaginal atrophy   Cervix:  Absent             Uterus:, Absent   Adnexa  Without masses or tenderness  Anus and perineum  normal   Rectovaginal  normal sphincter tone without palpated masses or tenderness             Hemoccult recent colonoscopy 2015 benign polyps removed    Assessment/Plan:  73 y.o. femaldoing well with vaginal estrogen twice a week. Does not need a refill. Recent colonoscopy demonstrating benign polyps. Patient on 5 year recall by gastroenterologist. Patient did not follow-up in 2014 on her left ovarian cyst will be scheduled in the next few weeks as well as in the month of January she will schedule a bone density. We discussed importance of calcium vitamin D and regular exercise for osteoporosis prevention. We discussed importance of monthly breast exam. Patient's PCP will be to her blood work. Patient did receive the flu vaccine today.   Terrance Mass MD, 9:41 AM 07/25/2014

## 2014-07-25 NOTE — Progress Notes (Signed)
Pre visit review using our clinic review tool, if applicable. No additional management support is needed unless otherwise documented below in the visit note. 

## 2014-07-25 NOTE — Telephone Encounter (Signed)
Type of Insurance:  Do we have a copy of Insurance Card on File?  Patient's PCP: Burnice Logan  PCP's NPI:   Treating Provider:   Treating Provider's NPI:  Treating Provider's Phone Number:  Treating Provider's Fax Number:  Reason for Visit (diagnosis): physical

## 2014-07-25 NOTE — Progress Notes (Signed)
Subjective:    Patient ID: [name], female    DOB: 18-Dec-1940, 73 y.o.   MRN: [id number]  HPI 20 -year-old patient who is seen today for a health maintenance exam.   Medical problems include hypothyroidism. She has treated hypercholesterolemia and history of colonic polyps. Her last colonoscopy was in 2015. Family history is pertinent for a strong family history of colon cancer. Her mother brother and son have all had colon cancer. Son died at age 50 of complications of colon cancer  Social history married 4 children number of grandchildren nonsmoker retired  Family history father died at 14 cup of cases of congestive heart failure Mother died 60 complications from a pulmonary embolism she had history of colon cancer One brother survivor colon cancer One son died of complications of colon cancer 5 brothers 2  sisters positive for coronary artery disease  Past Medical History  Diagnosis Date  . Hyperlipidemia   . Hx of colonoscopy with polypectomy   . Thyroid disease     History   Social History  . Marital Status: Married    Spouse Name: N/A    Number of Children: N/A  . Years of Education: N/A   Occupational History  . Not on file.   Social History Main Topics  . Smoking status: Former Research scientist (life sciences)  . Smokeless tobacco: Never Used     Comment: quit in 1980  . Alcohol Use: No  . Drug Use: No  . Sexual Activity: Not on file   Other Topics Concern  . Not on file   Social History Narrative    Past Surgical History  Procedure Laterality Date  . Appendectomy    . Abdominal hysterectomy    . Breast surgery      LUMPECTOMY- FIBROID TUMOR  . Enterocele repair    . Excisional hemorrhoidectomy      Family History  Problem Relation Age of Onset  . Colon cancer Mother 5  . Heart disease Father   . Colon cancer Brother 27  . Colon cancer Maternal Aunt 61  . Colon cancer Son 64  . Colon cancer Paternal Grandmother   . Colon cancer Other 32    No Known  Allergies  Current Outpatient Prescriptions on File Prior to Visit  Medication Sig Dispense Refill  . aspirin 81 MG tablet Take 81 mg by mouth daily.    . calcium carbonate (OS-CAL) 600 MG TABS Take 600 mg by mouth daily.    . Cholecalciferol (VITAMIN D-3 PO) Take by mouth daily.    Marland Kitchen estradiol (ESTRACE VAGINAL) 0.1 MG/GM vaginal cream Place 3.55 Applicatorfuls vaginally once a week. 42.5 g 3  . magnesium 30 MG tablet Take by mouth daily.    Marland Kitchen OVER THE COUNTER MEDICATION OTC SLEEP AID     No current facility-administered medications on file prior to visit.    BP 120/76 mmHg  Pulse 65  Temp(Src) 97.8 F (36.6 C) (Oral)  Resp 20  Ht 5' 5.5" (1.664 m)  Wt 144 lb (65.318 kg)  BMI 23.59 kg/m2  SpO2 97%   1. Risk factors, based on past  M,S,F history  cardiovascular risk factors include dyslipidemia as well as a family history of coronary artery disease  2.  Physical activities: Remains quite active without exercise limitations  3.  Depression/mood: No history depression normal disorder 4.  Hearing: No significant deficits  5.  ADL's: Independent in all aspects of daily living 6.  Fall risk: Low  7.  Home  safety: No problems identified  8.  Height weight, and visual acuity; height and weight stable no change in visual acuity  9.  Counseling: Heart healthy diet regular exercise encouraged  10. Lab orders based on risk factors: Laboratory update will be performed 11. Referral : Followup colonoscopy in 2015  12. Care plan: Colonoscopy 2015 heart healthy diet regular exercise encouraged we'll continue statin therapy  13. Cognitive assessment: Alert and oriented with normal affect. No cognitive dysfunction.  14.  Preventive services will include annual health examinations, including gynecologic evaluation.  Annual mammograms.  Encouraged.  She will continue to have colonoscopies at five-year intervals.  15.  Provider list updated.  Includes primary care ophthalmology gynecology  and GI     Review of Systems  Constitutional: Negative for fever, appetite change, fatigue and unexpected weight change.  HENT: Negative for congestion, dental problem, ear pain, hearing loss, mouth sores, nosebleeds, sinus pressure, sore throat, tinnitus, trouble swallowing and voice change.   Eyes: Negative for photophobia, pain, redness and visual disturbance.  Respiratory: Negative for cough, chest tightness and shortness of breath.   Cardiovascular: Negative for chest pain, palpitations and leg swelling.  Gastrointestinal: Negative for nausea, vomiting, abdominal pain, diarrhea, constipation, blood in stool, abdominal distention and rectal pain.  Genitourinary: Negative for dysuria, urgency, frequency, hematuria, flank pain, vaginal bleeding, vaginal discharge, difficulty urinating, genital sores, vaginal pain, menstrual problem and pelvic pain.  Musculoskeletal: Negative for back pain, arthralgias and neck stiffness.  Skin: Negative for rash.  Neurological: Negative for dizziness, syncope, speech difficulty, weakness, light-headedness, numbness and headaches.  Hematological: Negative for adenopathy. Does not bruise/bleed easily.  Psychiatric/Behavioral: Negative for suicidal ideas, behavioral problems, self-injury, dysphoric mood and agitation. The patient is not nervous/anxious.        Objective:   Physical Exam  Constitutional: She is oriented to person, place, and time. She appears well-developed and well-nourished.  HENT:  Head: Normocephalic and atraumatic.  Right Ear: External ear normal.  Left Ear: External ear normal.  Mouth/Throat: Oropharynx is clear and moist.  Eyes: Conjunctivae and EOM are normal.  Neck: Normal range of motion. Neck supple. No JVD present. No thyromegaly present.  Cardiovascular: Normal rate, regular rhythm, normal heart sounds and intact distal pulses.   No murmur heard. Pulmonary/Chest: Effort normal and breath sounds normal. She has no wheezes.  She has no rales.  Abdominal: Soft. Bowel sounds are normal. She exhibits no distension and no mass. There is no tenderness. There is no rebound and no guarding.  Genitourinary: Vagina normal.  Musculoskeletal: Normal range of motion. She exhibits no edema or tenderness.  Neurological: She is alert and oriented to person, place, and time. She has normal reflexes. No cranial nerve deficit. She exhibits normal muscle tone. Coordination normal.  Skin: Skin is warm and dry. No rash noted.  Psychiatric: She has a normal mood and affect. Her behavior is normal.          Assessment & Plan:   Preventive health exam Hypercholesterolemia. We'll continue simvastatin 40 mg daily Hypothyroidism. We'll check a TSH Colonic polyps/strong family history of colon cancer.  Will continue close surveillance  Recheck in one year or as needed

## 2014-07-25 NOTE — Telephone Encounter (Signed)
Type of Insurance:  Do we have a copy of Insurance Card on File?  Patient's PCP: Burnice Logan  PCP's NPI:  Treating Provider: Uvaldo Rising  Treating Provider's NPI:  Treating Provider's Phone Number: [phone]  Treating Provider's Fax Number:  Reason for Visit (diagnosis): Physical

## 2014-08-10 ENCOUNTER — Encounter: Payer: Self-pay | Admitting: Gynecology

## 2014-08-10 ENCOUNTER — Ambulatory Visit (INDEPENDENT_AMBULATORY_CARE_PROVIDER_SITE_OTHER): Payer: Commercial Managed Care - HMO | Admitting: Gynecology

## 2014-08-10 ENCOUNTER — Ambulatory Visit (INDEPENDENT_AMBULATORY_CARE_PROVIDER_SITE_OTHER): Payer: Commercial Managed Care - HMO

## 2014-08-10 DIAGNOSIS — N8320 Unspecified ovarian cysts: Secondary | ICD-10-CM

## 2014-08-10 DIAGNOSIS — N83202 Unspecified ovarian cyst, left side: Secondary | ICD-10-CM

## 2014-08-10 DIAGNOSIS — N83209 Unspecified ovarian cyst, unspecified side: Secondary | ICD-10-CM

## 2014-08-10 DIAGNOSIS — R102 Pelvic and perineal pain: Secondary | ICD-10-CM

## 2014-08-10 DIAGNOSIS — N952 Postmenopausal atrophic vaginitis: Secondary | ICD-10-CM

## 2014-08-10 DIAGNOSIS — M549 Dorsalgia, unspecified: Secondary | ICD-10-CM

## 2014-08-10 NOTE — Progress Notes (Signed)
   Patient presented to the office today for recommended ultrasound. Patient had been seen in the office in November of this year for her annual exam and it was noted that on the exam on October 24 because of left lower abdominal discomfort she had an ultrasound which had demonstrated that she had a small left ovarian cyst which measured 21 x 19 minute 19 mm. Contralateral ovary was normal. Patient with previous hysterectomy. She had a normal C1 25. She is asymptomatic today.  Ultrasound: Absent uterus. Right ovary atrophic normal echo pattern. Left ovary atrophic with a thinwall cyst measuring 23 x 21 x 21 mm avascular essentially unchanged since 2014.  Patient was noted to have vaginal bleeding after transvaginal ultrasound. On speculum exam her vagina was found to be very friable on contact and Monsel solution was used for hemostasis.  Assessment/plan: #1 severe vaginal atrophy patient will be asked to increase her vaginal estrogen cream to 3 times a week and I will see her in one month for follow-up. 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 Cyst appears to be insignificant patient asymptomatic. Patient refuses CA-125 today. Which she returns back to the office next month for follow-up on her vaginal atrophy will discuss the pros and cons of laparoscopic bilateral salpingo-oophorectomy. Since is has been stable unchanged in size and appears to be totally insignificant watchful follow-up with yearly ultrasound appears to be reasonable.

## 2014-08-10 NOTE — Patient Instructions (Signed)
Ovarian Cyst An ovarian cyst is a fluid-filled sac that forms on an ovary. The ovaries are small organs that produce eggs in women. Various types of cysts can form on the ovaries. Most are not cancerous. Many do not cause problems, and they often go away on their own. Some may cause symptoms and require treatment. Common types of ovarian cysts include:  Functional cysts--These cysts may occur every month during the menstrual cycle. This is normal. The cysts usually go away with the next menstrual cycle if the woman does not get pregnant. Usually, there are no symptoms with a functional cyst. Endometrioma cysts--These cysts form from the tissue that lines the uterus. They are also called "chocolate cysts" because they become filled with blood that turns brown. This type of cyst CA-125 Tumor Marker CA 125 is a tumor marker that is used to help monitor the course of ovarian or endometrial cancer. PREPARATION FOR TEST No preparation is necessary. NORMAL FINDINGS Adults: 0-35 units/mL (0-35 kilounits)/L Ranges for normal findings may vary among different laboratories and hospitals. You should always check with your doctor after having lab work or other tests done to discuss the meaning of your test results and whether your values are considered within normal limits. MEANING OF TEST  Your caregiver will go over the test results with you and discuss the importance and meaning of your results, as well as treatment options and the need for additional tests if necessary. OBTAINING THE TEST RESULTS It is your responsibility to obtain your test results. Ask the lab or department performing the test when and how you will get your results. Document Released: 09/10/2004 Document Revised: 11/11/2011 Document Reviewed: 07/27/2008 [facility] Patient Information 2015 [place], [place]. This information is not intended to replace advice given to you by your health care provider. Make sure you discuss any questions you have  with your health care provider.  can cause pain in the lower abdomen during intercourse and with your menstrual period.  Cystadenoma cysts--This type develops from the cells on the outside of the ovary. These cysts can get very big and cause lower abdomen pain and pain with intercourse. This type of cyst can twist on itself, cut off its blood supply, and cause severe pain. It can also easily rupture and cause a lot of pain.  Dermoid cysts--This type of cyst is sometimes found in both ovaries. These cysts may contain different kinds of body tissue, such as skin, teeth, hair, or cartilage. They usually do not cause symptoms unless they get very big.  Theca lutein cysts--These cysts occur when too much of a certain hormone (human chorionic gonadotropin) is produced and overstimulates the ovaries to produce an egg. This is most common after procedures used to assist with the conception of a baby (in vitro fertilization). CAUSES   Fertility drugs can cause a condition in which multiple large cysts are formed on the ovaries. This is called ovarian hyperstimulation syndrome.  A condition called polycystic ovary syndrome can cause hormonal imbalances that can lead to nonfunctional ovarian cysts. SIGNS AND SYMPTOMS  Many ovarian cysts do not cause symptoms. If symptoms are present, they may include:  Pelvic pain or pressure.  Pain in the lower abdomen.  Pain during sexual intercourse.  Increasing girth (swelling) of the abdomen.  Abnormal menstrual periods.  Increasing pain with menstrual periods.  Stopping having menstrual periods without being pregnant. DIAGNOSIS  These cysts are commonly found during a routine or annual pelvic exam. Tests may be ordered to find  out more about the cyst. These tests may include:  Ultrasound.  X-ray of the pelvis.  CT scan.  MRI.  Blood tests. TREATMENT  Many ovarian cysts go away on their own without treatment. Your health care provider may want to  check your cyst regularly for 2-3 months to see if it changes. For women in menopause, it is particularly important to monitor a cyst closely because of the higher rate of ovarian cancer in menopausal women. When treatment is needed, it may include any of the following:  A procedure to drain the cyst (aspiration). This may be done using a long needle and ultrasound. It can also be done through a laparoscopic procedure. This involves using a thin, lighted tube with a tiny camera on the end (laparoscope) inserted through a small incision.  Surgery to remove the whole cyst. This may be done using laparoscopic surgery or an open surgery involving a larger incision in the lower abdomen.  Hormone treatment or birth control pills. These methods are sometimes used to help dissolve a cyst. HOME CARE INSTRUCTIONS   Only take over-the-counter or prescription medicines as directed by your health care provider.  Follow up with your health care provider as directed.  Get regular pelvic exams and Pap tests. SEEK MEDICAL CARE IF:   Your periods are late, irregular, or painful, or they stop.  Your pelvic pain or abdominal pain does not go away.  Your abdomen becomes larger or swollen.  You have pressure on your bladder or trouble emptying your bladder completely.  You have pain during sexual intercourse.  You have feelings of fullness, pressure, or discomfort in your stomach.  You lose weight for no apparent reason.  You feel generally ill.  You become constipated.  You lose your appetite.  You develop acne.  You have an increase in body and facial hair.  You are gaining weight, without changing your exercise and eating habits.  You think you are pregnant. SEEK IMMEDIATE MEDICAL CARE IF:   You have increasing abdominal pain.  You feel sick to your stomach (nauseous), and you throw up (vomit).  You develop a fever that comes on suddenly.  You have abdominal pain during a bowel  movement.  Your menstrual periods become heavier than usual. MAKE SURE YOU:  Understand these instructions.  Will watch your condition.  Will get help right away if you are not doing well or get worse. Document Released: 08/19/2005 Document Revised: 08/24/2013 Document Reviewed: 04/26/2013 [facility] Patient Information 2015 [place], [place]. This information is not intended to replace advice given to you by your health care provider. Make sure you discuss any questions you have with your health care provider.

## 2014-08-20 ENCOUNTER — Encounter (HOSPITAL_COMMUNITY): Payer: Self-pay | Admitting: Emergency Medicine

## 2014-08-20 ENCOUNTER — Emergency Department (HOSPITAL_COMMUNITY): Payer: Commercial Managed Care - HMO

## 2014-08-20 ENCOUNTER — Emergency Department (HOSPITAL_COMMUNITY)
Admission: EM | Admit: 2014-08-20 | Discharge: 2014-08-20 | Disposition: A | Discharge status: Home / Self Care | Payer: Commercial Managed Care - HMO | Attending: Emergency Medicine | Admitting: Emergency Medicine

## 2014-08-20 DIAGNOSIS — Z79899 Other long term (current) drug therapy: Secondary | ICD-10-CM | POA: Insufficient documentation

## 2014-08-20 DIAGNOSIS — N898 Other specified noninflammatory disorders of vagina: Secondary | ICD-10-CM | POA: Diagnosis not present

## 2014-08-20 DIAGNOSIS — Z7982 Long term (current) use of aspirin: Secondary | ICD-10-CM | POA: Diagnosis not present

## 2014-08-20 DIAGNOSIS — K5900 Constipation, unspecified: Secondary | ICD-10-CM | POA: Insufficient documentation

## 2014-08-20 DIAGNOSIS — E079 Disorder of thyroid, unspecified: Secondary | ICD-10-CM | POA: Diagnosis not present

## 2014-08-20 DIAGNOSIS — E785 Hyperlipidemia, unspecified: Secondary | ICD-10-CM | POA: Insufficient documentation

## 2014-08-20 DIAGNOSIS — R1084 Generalized abdominal pain: Secondary | ICD-10-CM

## 2014-08-20 DIAGNOSIS — Z87891 Personal history of nicotine dependence: Secondary | ICD-10-CM | POA: Insufficient documentation

## 2014-08-20 DIAGNOSIS — R110 Nausea: Secondary | ICD-10-CM | POA: Diagnosis not present

## 2014-08-20 LAB — CBC WITH DIFFERENTIAL/PLATELET
BASOS ABS: 0 10*3/uL (ref 0.0–0.1)
BASOS PCT: 0 % (ref 0–1)
EOS PCT: 1 % (ref 0–5)
Eosinophils Absolute: 0.1 10*3/uL (ref 0.0–0.7)
HCT: 41.5 % (ref 36.0–46.0)
Hemoglobin: 13.6 g/dL (ref 12.0–15.0)
LYMPHS PCT: 22 % (ref 12–46)
Lymphs Abs: 1.6 10*3/uL (ref 0.7–4.0)
MCH: 29.6 pg (ref 26.0–34.0)
MCHC: 32.8 g/dL (ref 30.0–36.0)
MCV: 90.2 fL (ref 78.0–100.0)
Monocytes Absolute: 0.4 10*3/uL (ref 0.1–1.0)
Monocytes Relative: 6 % (ref 3–12)
Neutro Abs: 5 10*3/uL (ref 1.7–7.7)
Neutrophils Relative %: 71 % (ref 43–77)
PLATELETS: 196 10*3/uL (ref 150–400)
RBC: 4.6 MIL/uL (ref 3.87–5.11)
RDW: 12.6 % (ref 11.5–15.5)
WBC: 7.1 10*3/uL (ref 4.0–10.5)

## 2014-08-20 LAB — URINALYSIS, ROUTINE W REFLEX MICROSCOPIC
Bilirubin Urine: NEGATIVE
Glucose, UA: NEGATIVE mg/dL
Ketones, ur: NEGATIVE mg/dL
Leukocytes, UA: NEGATIVE
Nitrite: NEGATIVE
Protein, ur: NEGATIVE mg/dL
Specific Gravity, Urine: 1.009 (ref 1.005–1.030)
Urobilinogen, UA: 0.2 mg/dL (ref 0.0–1.0)
pH: 7 (ref 5.0–8.0)

## 2014-08-20 LAB — LIPASE, BLOOD: LIPASE: 32 U/L (ref 11–59)

## 2014-08-20 LAB — COMPREHENSIVE METABOLIC PANEL
ALBUMIN: 4.1 g/dL (ref 3.5–5.2)
ALT: 18 U/L (ref 0–35)
ANION GAP: 10 (ref 5–15)
AST: 28 U/L (ref 0–37)
Alkaline Phosphatase: 63 U/L (ref 39–117)
BUN: 12 mg/dL (ref 6–23)
CALCIUM: 9.2 mg/dL (ref 8.4–10.5)
CO2: 27 mEq/L (ref 19–32)
CREATININE: 0.91 mg/dL (ref 0.50–1.10)
Chloride: 102 mEq/L (ref 96–112)
GFR calc Af Amer: 70 mL/min — ABNORMAL LOW (ref >90)
GFR calc non Af Amer: 61 mL/min — ABNORMAL LOW (ref >90)
Glucose, Bld: 82 mg/dL (ref 70–99)
Potassium: 4.4 mEq/L (ref 3.7–5.3)
Sodium: 139 mEq/L (ref 137–147)
TOTAL PROTEIN: 7.1 g/dL (ref 6.0–8.3)
Total Bilirubin: 0.4 mg/dL (ref 0.3–1.2)

## 2014-08-20 LAB — WET PREP, GENITAL
Clue Cells Wet Prep HPF POC: NONE SEEN
Trich, Wet Prep: NONE SEEN
Yeast Wet Prep HPF POC: NONE SEEN

## 2014-08-20 LAB — URINE MICROSCOPIC-ADD ON

## 2014-08-20 MED ORDER — ONDANSETRON HCL 4 MG/2ML IJ SOLN
4.0000 mg | Freq: Once | INTRAMUSCULAR | Status: AC
  Administered 2014-08-20: 4 mg via INTRAVENOUS
  Filled 2014-08-20: qty 2

## 2014-08-20 MED ORDER — IOHEXOL 300 MG/ML  SOLN
100.0000 mL | Freq: Once | INTRAMUSCULAR | Status: AC | PRN
  Administered 2014-08-20: 100 mL via INTRAVENOUS

## 2014-08-20 MED ORDER — MORPHINE SULFATE 4 MG/ML IJ SOLN
4.0000 mg | Freq: Once | INTRAMUSCULAR | Status: AC
  Administered 2014-08-20: 4 mg via INTRAVENOUS
  Filled 2014-08-20: qty 1

## 2014-08-20 MED ORDER — POLYETHYLENE GLYCOL 3350 17 G PO PACK: 17.0000 g | PACK | Freq: Every day | ORAL | Status: DC

## 2014-08-20 MED ORDER — IOHEXOL 300 MG/ML  SOLN
25.0000 mL | Freq: Once | INTRAMUSCULAR | Status: AC | PRN
  Administered 2014-08-20: 25 mL via ORAL

## 2014-08-20 NOTE — ED Notes (Signed)
Pt aware of need of urine specimen; pt will let us know when she can go

## 2014-08-20 NOTE — ED Provider Notes (Signed)
CSN: [id number]     Arrival date & time 08/20/14  1301 History   First MD Initiated Contact with Patient 08/20/14 1342     Chief Complaint  Patient presents with  . Abdominal Pain  . Vaginal Discharge     (Consider location/radiation/quality/duration/timing/severity/associated sxs/prior Treatment) HPI Pt is a 73yo female with hx of hyperlipidemia, colonoscopy with polypectomy, and thyroid disease presenting to ED with c/o sudden onset lower abdominal pain that started this morning after she bent down to pick something up around 6AM this morning.  Pain has been constant since, waxing and waning in severity, worse with certain movements. Pt states pain is 8/10 with movement but only 2/10 at rest. Pain initially sharp in nature, but dull ache at rest. Pt took ibuprofen PTA with minimal relief. Associated with mild nausea. Denies fever, chills, vomiting or diarrhea. Denies urinary symptoms, however, does report having a vaginal U/S performed by her GYN to check for cancer.  States U/S only significant for a small cyst on the left side she was told was "insignificant" denies previous hx of ovarian cysts or fibroids. Per medical records, pt had initial U/S last year due to LLQ pain, found to have a left ovarian cyst.  Most recent U/S was routine f/u. States since the U/S she had "profuse" vaginal bleeding which has gradually resolved, however, she is still having small amount of vaginal discharge with small amounts of red blood. Per medical records her GYN, Dr. Toney Rakes believes symptoms due to severe vaginal atrophy, advised to use topical estrogen cream and f/u next month. Denies vaginal pain. Abdominal surgical hx significant for appendectomy and hysterectomy.   Past Medical History  Diagnosis Date  . Hyperlipidemia   . Hx of colonoscopy with polypectomy   . Thyroid disease    Past Surgical History  Procedure Laterality Date  . Appendectomy    . Abdominal hysterectomy    . Breast surgery       LUMPECTOMY- FIBROID TUMOR  . Enterocele repair    . Excisional hemorrhoidectomy     Family History  Problem Relation Age of Onset  . Colon cancer Mother 61  . Heart disease Father   . Colon cancer Brother 11  . Colon cancer Maternal Aunt 76  . Colon cancer Son 42  . Colon cancer Paternal Grandmother   . Colon cancer Other 32   History  Substance Use Topics  . Smoking status: Former Research scientist (life sciences)  . Smokeless tobacco: Never Used     Comment: quit in 1980  . Alcohol Use: No   OB History    Gravida Para Term Preterm AB TAB SAB Ectopic Multiple Living   4 4        4      Review of Systems  Constitutional: Negative for fever and chills.  Respiratory: Negative for cough and shortness of breath.   Cardiovascular: Negative for chest pain and palpitations.  Gastrointestinal: Positive for nausea and abdominal pain. Negative for vomiting, diarrhea and constipation.  Genitourinary: Positive for vaginal bleeding, vaginal discharge, menstrual problem and pelvic pain. Negative for dysuria, urgency, frequency, hematuria and flank pain.  All other systems reviewed and are negative.     Allergies  Crab  Home Medications   Prior to Admission medications   Medication Sig Start Date End Date Taking? Authorizing Provider  ALPRAZolam (XANAX) 0.5 MG tablet TAKE ONE TABLET BY MOUTH ONCE DAILY AS NEEDED Patient taking differently: Take 0.5 mg by mouth daily.  07/25/14  Yes Marletta Lor,  MD  aspirin EC 81 MG tablet Take 81 mg by mouth daily.   Yes Historical Provider, MD  CALCIUM-MAGNESIUM-VITAMIN D PO Take 1 tablet by mouth 2 (two) times daily.   Yes Historical Provider, MD  Cholecalciferol (VITAMIN D) 2000 UNITS tablet Take 2,000 Units by mouth daily.   Yes Historical Provider, MD  estradiol (ESTRACE VAGINAL) 0.1 MG/GM vaginal cream Place 9.17 Applicatorfuls vaginally once a week. Patient taking differently: Place 0.5 Applicatorfuls vaginally 2 (two) times a week. Tuesday and Friday 05/06/14   Yes Terrance Mass, MD  ibuprofen (ADVIL,MOTRIN) 200 MG tablet Take 400 mg by mouth daily as needed for headache (pain).   Yes Historical Provider, MD  levothyroxine (SYNTHROID, LEVOTHROID) 75 MCG tablet TAKE  1  TABLET  DAILY Patient taking differently: Take 75 mcg by mouth daily before breakfast.  07/25/14  Yes Marletta Lor, MD  MAGNESIUM PO Take 1 tablet by mouth daily.   Yes Historical Provider, MD  OVER THE COUNTER MEDICATION Take 1 capsule by mouth at bedtime. OTC SLEEP AID   Yes Historical Provider, MD  simvastatin (ZOCOR) 20 MG tablet Take 1 tablet (20 mg total) by mouth daily. Patient taking differently: Take 20 mg by mouth at bedtime.  07/25/14  Yes Marletta Lor, MD  polyethylene glycol [facility] / Floria Raveling) packet Take 17 g by mouth daily. 08/20/14   Noland Fordyce, PA-C   BP 166/86 mmHg  Pulse 69  Temp(Src) 97.7 F (36.5 C)  Resp 18  Ht 5\' 6"  (1.676 m)  Wt 144 lb (65.318 kg)  BMI 23.25 kg/m2  SpO2 100% Physical Exam  Constitutional: She appears well-developed and well-nourished. No distress.  Pt lying comfortably in exam bed, NAD.   HENT:  Head: Normocephalic and atraumatic.  Eyes: Conjunctivae are normal. No scleral icterus.  Neck: Normal range of motion.  Cardiovascular: Normal rate, regular rhythm and normal heart sounds.   Pulmonary/Chest: Effort normal and breath sounds normal. No respiratory distress. She has no wheezes. She has no rales. She exhibits no tenderness.  Abdominal: Soft. Bowel sounds are normal. She exhibits no distension and no mass. There is tenderness in the right upper quadrant, right lower quadrant and periumbilical area. There is no rebound and no guarding.  Soft, non-distended. Tenderness to abdomen. No rebound, guarding, or masses palpated.  Genitourinary:  Chaperoned exam. Normal external genitalia. Vaginal canal: small amount of yellow-white discharge, scant red blood. No CMT, adnexal tenderness, adnexal tenderness or masses.   Musculoskeletal: Normal range of motion.  Neurological: She is alert.  Skin: Skin is warm and dry. She is not diaphoretic.  Nursing note and vitals reviewed.   ED Course  Procedures (including critical care time) Labs Review Labs Reviewed  WET PREP, GENITAL - Abnormal; Notable for the following:    WBC, Wet Prep HPF POC MANY (*)    All other components within normal limits  COMPREHENSIVE METABOLIC PANEL - Abnormal; Notable for the following:    GFR calc non Af Amer 61 (*)    GFR calc Af Amer 70 (*)    All other components within normal limits  URINALYSIS, ROUTINE W REFLEX MICROSCOPIC - Abnormal; Notable for the following:    Hgb urine dipstick SMALL (*)    All other components within normal limits  URINE CULTURE  CBC WITH DIFFERENTIAL  LIPASE, BLOOD  URINE MICROSCOPIC-ADD ON    Imaging Review Ct Abdomen Pelvis W Contrast  08/20/2014   CLINICAL DATA:  Right lower quadrant pain, vaginal bleeding, status  post hysterectomy, history of cervical cancer  EXAM: CT ABDOMEN AND PELVIS WITH CONTRAST  TECHNIQUE: Multidetector CT imaging of the abdomen and pelvis was performed using the standard protocol following bolus administration of intravenous contrast.  CONTRAST:  116mL OMNIPAQUE IOHEXOL 300 MG/ML  SOLN  COMPARISON:  07/15/2013  FINDINGS: Lower chest:  Lung bases are clear.  Hepatobiliary: Liver is within normal limits.  Gallbladder is unremarkable. No intrahepatic or extrahepatic ductal dilatation.  Pancreas: Within normal limits.  Spleen: Within normal limits.  Adrenals/Urinary Tract: Adrenal gland are unremarkable.  Kidneys are within normal limits.  No hydronephrosis.  Bladder is unremarkable.  Stomach/Bowel: Stomach is unremarkable.  No evidence of bowel obstruction.  Prior appendectomy.  Moderate colonic stool burden.  Postsurgical changes involving the rectum (series 2/image 39).  Vascular/Lymphatic: Atherosclerotic calcifications of the abdominal aorta and branch vessels.  No  suspicious abdominopelvic lymphadenopathy.  Reproductive: Status post hysterectomy.  Right ovary is within normal limits.  2.4 cm left ovarian cyst, previously 1.8 cm.  Other: No abdominopelvic ascites.  Small fat containing left inguinal hernia.  Musculoskeletal: Mild degenerative changes of the visualized thoracolumbar spine.  IMPRESSION: No evidence of bowel obstruction.  Prior appendectomy.  Moderate colonic stool burden, suggesting constipation.  Status post hysterectomy.  2.4 cm left ovarian cyst, previously 1.8 cm. Follow-up pelvic ultrasound is suggested in 1 year in a postmenopausal patient.   Electronically Signed   By: Julian Hy M.D.   On: 08/20/2014 16:39     EKG Interpretation None      MDM   Final diagnoses:  Generalized abdominal pain  Constipation, unspecified constipation type  Vaginal discharge   Pt is a 73yo female seen by Dr. Toney Rakes, GYN, received a routine f/u U/S of a left ovarian cyst found last year.  U/S of cyst was insignificant.  Vaginal bleeding s/p U/S believed to be due to severe vaginal atrophy.  Today on exam, pt is tender to right side and across entire lower abdomen. No rebound or guarding. Pelvic exam significant for scant red blood and yellow-white vaginal discharge w/o CMT, adnexal tenderness or mass.  Pt is s/p hysterectomy as well as appendectomy.  Pt given zofran and morphine.  DDx: cholecystitis, SBO, ovarian abscess, UTI.  Labs: unremarkable.  CT abd: pending Discussed pt with Dr. Vanita Panda who also examined pt, agrees with assessment and plan.   CT Abd: no evidence of bowel obstruction. Prior appendetomy. Moderate stool burden suggestive of constipation.  2.4cm left ovarian cyst, previously 1.8cm.  Radiology recommends f/u pelvic U/S in 14yr, however, pt currently being closely followed by GYN.  Will have pt f/u with her PCP as well as GYN for monitoring of her ovarian cyst.  Pain likely due to the moderate stool burden. No evidence of  emergent process taking place at this time.  Will discharge home with miralax, home care instructions provided. Advised to f/u with her PCP in 2-3 days for recheck of symptoms. Return precautions provided. Pt verbalized understanding and agreement with tx plan.   Noland Fordyce, PA-C 08/21/14 7622  Carmin Muskrat, MD 08/21/14 [phone]

## 2014-08-20 NOTE — ED Notes (Signed)
Pt undressed, in gown, on continuous pulse oximetry and blood pressure cuff; husband at bedside

## 2014-08-20 NOTE — ED Notes (Signed)
Pt. Stated, I was fine this morning and all of sudden I got this awful pain in my lower stomach.  Really severe. I've had a vaginal [place] for a cyst.  Im not sure whats going on.

## 2014-08-20 NOTE — ED Notes (Signed)
[place] was on the 9th of December and I had bleeding profusely.

## 2014-08-20 NOTE — ED Notes (Signed)
CT MADE AWARE PT FINISHED WITH HER CONTRAST.

## 2014-08-21 LAB — URINE CULTURE
Colony Count: NO GROWTH
Culture: NO GROWTH

## 2014-08-24 ENCOUNTER — Telehealth: Payer: Self-pay | Admitting: Internal Medicine

## 2014-08-24 NOTE — Telephone Encounter (Signed)
Spoke to pt told her lab results from 11/23 were all within normal range. Pt verbalized understanding.

## 2014-08-24 NOTE — Telephone Encounter (Signed)
Pt would results of labs pls call back

## 2014-09-07 ENCOUNTER — Other Ambulatory Visit: Payer: Commercial Managed Care - HMO

## 2014-09-13 ENCOUNTER — Encounter: Payer: Commercial Managed Care - HMO | Admitting: Internal Medicine

## 2014-09-22 ENCOUNTER — Ambulatory Visit (INDEPENDENT_AMBULATORY_CARE_PROVIDER_SITE_OTHER): Payer: Commercial Managed Care - HMO | Admitting: Family Medicine

## 2014-09-22 ENCOUNTER — Encounter: Payer: Self-pay | Admitting: Family Medicine

## 2014-09-22 VITALS — BP 138/72 | HR 82 | Temp 99.1°F | Wt 144.0 lb

## 2014-09-22 DIAGNOSIS — B9789 Other viral agents as the cause of diseases classified elsewhere: Principal | ICD-10-CM

## 2014-09-22 DIAGNOSIS — J069 Acute upper respiratory infection, unspecified: Secondary | ICD-10-CM | POA: Insufficient documentation

## 2014-09-22 MED ORDER — HYDROCODONE-HOMATROPINE 5-1.5 MG/5ML PO SYRP: 5.0000 mL | ORAL_SOLUTION | Freq: Three times a day (TID) | ORAL | Status: DC | PRN

## 2014-09-22 NOTE — Patient Instructions (Signed)
For your viral infection we recommend you drink lots of liquids  Vaporizer  Hydromet 1/2 teaspoon 3 times daily when necessary for cough  Get lots of rest

## 2014-09-22 NOTE — Progress Notes (Signed)
   Subjective:    Patient ID: [name], female    DOB: August 08, 1941, 74 y.o.   MRN: [id number]  HPI [name] is a 74 year old married female nonsmoker who comes in today for evaluation of a cold  She's had a cold for about 8 days manifested by a congestion sore throat and cough. No fever  No history of any lung disease never smoked   Review of Systems Review of systems negative    Objective:   Physical Exam  Well-developed well-nourished female no acute distress vital signs stable she's afebrile HEENT were negative neck was supple no adenopathy lungs are clear      Assessment & Plan:  Viral syndrome....... treat symptomatically with Hydromet........ antibiotics not indicated

## 2014-09-22 NOTE — Progress Notes (Signed)
Pre visit review using our clinic review tool, if applicable. No additional management support is needed unless otherwise documented below in the visit note. 

## 2014-12-12 ENCOUNTER — Telehealth: Payer: Self-pay | Admitting: *Deleted

## 2014-12-12 NOTE — Telephone Encounter (Signed)
May have premarin vaginal cream 1-2 times a week. #1 tube refills x 5

## 2014-12-12 NOTE — Telephone Encounter (Signed)
Pt called requesting Rx for premarin vaginal states it is cheaper on her formulary list. Currently taking estrace 0.1 vaginal cream apply twice weekly. Please advise

## 2014-12-13 MED ORDER — ESTROGENS, CONJUGATED 0.625 MG/GM VA CREA: TOPICAL_CREAM | VAGINAL | Status: DC

## 2014-12-13 NOTE — Telephone Encounter (Signed)
Pt informed she asked me to mail her Rx to take a cheaper pharmacy. This was done

## 2015-01-12 ENCOUNTER — Encounter: Payer: Self-pay | Admitting: Family Medicine

## 2015-01-12 ENCOUNTER — Ambulatory Visit (INDEPENDENT_AMBULATORY_CARE_PROVIDER_SITE_OTHER): Payer: Commercial Managed Care - HMO | Admitting: Family Medicine

## 2015-01-12 VITALS — BP 132/64 | HR 83 | Temp 98.0°F | Ht 66.0 in | Wt 144.9 lb

## 2015-01-12 DIAGNOSIS — R300 Dysuria: Secondary | ICD-10-CM

## 2015-01-12 LAB — POCT URINALYSIS DIPSTICK
Bilirubin, UA: NEGATIVE
GLUCOSE UA: NEGATIVE
Ketones, UA: NEGATIVE
NITRITE UA: NEGATIVE
Protein, UA: NEGATIVE
SPEC GRAV UA: 1.015
UROBILINOGEN UA: 0.2
pH, UA: 7.5

## 2015-01-12 MED ORDER — NITROFURANTOIN MONOHYD MACRO 100 MG PO CAPS: 100.0000 mg | ORAL_CAPSULE | Freq: Two times a day (BID) | ORAL | Status: DC

## 2015-01-12 NOTE — Progress Notes (Signed)
Pre visit review using our clinic review tool, if applicable. No additional management support is needed unless otherwise documented below in the visit note. 

## 2015-01-12 NOTE — Progress Notes (Signed)
HPI:  [name] is a pleasant 74 yo F patient of Dr. 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  Past Medical History  Diagnosis Date  . Hyperlipidemia   . Hx of colonoscopy with polypectomy   . Thyroid disease     Past Surgical History  Procedure Laterality Date  . Appendectomy    . Abdominal hysterectomy    . Breast surgery      LUMPECTOMY- FIBROID TUMOR  . Enterocele repair    . Excisional hemorrhoidectomy      Family History  Problem Relation Age of Onset  . Colon cancer Mother 44  . Heart disease Father   . Colon cancer Brother 62  . Colon cancer Maternal Aunt 30  . Colon cancer Son 28  . Colon cancer Paternal Grandmother   . Colon cancer Other 32    History   Social History  . Marital Status: Married    Spouse Name: N/A  . Number of Children: N/A  . Years of Education: N/A   Social History Main Topics  . Smoking status: Former Research scientist (life sciences)  . Smokeless tobacco: Never Used     Comment: quit in 1980  . Alcohol Use: No  . Drug Use: No  . Sexual Activity: Not on file   Other Topics Concern  . None   Social History Narrative     Current outpatient prescriptions:  .  ALPRAZolam (XANAX) 0.5 MG tablet, TAKE ONE TABLET BY MOUTH ONCE DAILY AS NEEDED (Patient taking differently: Take 0.5 mg by mouth daily. ), Disp: 60 tablet, Rfl: 2 .  aspirin EC 81 MG tablet, Take 81 mg by mouth daily., Disp: , Rfl:  .  CALCIUM-MAGNESIUM-VITAMIN D PO, Take 1 tablet by mouth 2 (two) times daily., Disp: , Rfl:  .  Cholecalciferol (VITAMIN D) 2000 UNITS tablet, Take 2,000 Units by mouth daily., Disp: , Rfl:  .  conjugated  estrogens (PREMARIN) vaginal cream, Place 1 gram vaginally 1-2 times weekly, Disp: 42.5 g, Rfl: 5 .  estradiol (ESTRACE VAGINAL) 0.1 MG/GM vaginal cream, Place 4.65 Applicatorfuls vaginally once a week. (Patient taking differently: Place 0.5 Applicatorfuls vaginally 2 (two) times a week. Tuesday and Friday), Disp: 42.5 g, Rfl: 3 .  HYDROcodone-homatropine (HYCODAN) 5-1.5 MG/5ML syrup, Take 5 mLs by mouth every 8 (eight) hours as needed., Disp: 240 mL, Rfl: 0 .  ibuprofen (ADVIL,MOTRIN) 200 MG tablet, Take 400 mg by mouth daily as needed for headache (pain)., Disp: , Rfl:  .  levothyroxine (SYNTHROID, LEVOTHROID) 75 MCG tablet, TAKE  1  TABLET  DAILY (Patient taking differently: Take 75 mcg by mouth daily before breakfast. ), Disp: 90 tablet, Rfl: 3 .  MAGNESIUM PO, Take 1 tablet by mouth daily., Disp: , Rfl:  .  OVER THE COUNTER MEDICATION, Take 1 capsule by mouth at bedtime. OTC SLEEP AID, Disp: , Rfl:  .  polyethylene glycol (MIRALAX / GLYCOLAX) packet, Take 17 g by mouth daily., Disp: 14 each, Rfl: 0 .  simvastatin (ZOCOR) 20 MG tablet, Take 1 tablet (20 mg total) by mouth daily. (Patient taking differently: Take 20 mg by mouth at bedtime. ), Disp: 90 tablet, Rfl:  3 .  nitrofurantoin, macrocrystal-monohydrate, (MACROBID) 100 MG capsule, Take 1 capsule (100 mg total) by mouth 2 (two) times daily., Disp: 14 capsule, Rfl: 0  EXAM:  Filed Vitals:   01/12/15 1557  BP: 132/64  Pulse: 83  Temp: 98 F (36.7 C)    Body mass index is 23.4 kg/(m^2).  GENERAL: vitals reviewed and listed above, alert, oriented, appears well hydrated and in no acute distress  HEENT: atraumatic, conjunttiva clear, no obvious abnormalities on inspection of external nose and ears  NECK: no obvious masses on inspection  LUNGS: clear to auscultation bilaterally, no wheezes, rales or rhonchi, good air movement  CV: HRRR, no peripheral edema  ABD: BS+, soft, NTTP  MS: moves all extremities without noticeable  abnormality  PSYCH: pleasant and cooperative, no obvious depression or anxiety  ASSESSMENT AND PLAN:  Discussed the following assessment and plan:  Dysuria - Plan: POC Urinalysis Dipstick, nitrofurantoin, macrocrystal-monohydrate, (MACROBID) 100 MG capsule, Urine culture  -blood and 1+leuks, but hx hematuria, several neg cultures on ROC -advise culture to ensure inf given her hx, she is adamant about starting empiric abx in interim - risks discussed -advised if no inf or if symptoms persist to see her urologist -Patient advised to return or notify a doctor immediately if symptoms worsen or persist or new concerns arise.  There are no Patient Instructions on file for this visit.   Colin Benton R.

## 2015-01-14 LAB — URINE CULTURE
COLONY COUNT: NO GROWTH
ORGANISM ID, BACTERIA: NO GROWTH

## 2015-01-16 ENCOUNTER — Telehealth: Payer: Self-pay | Admitting: Internal Medicine

## 2015-01-16 DIAGNOSIS — Z87440 Personal history of urinary (tract) infections: Secondary | ICD-10-CM

## 2015-01-16 DIAGNOSIS — N3946 Mixed incontinence: Secondary | ICD-10-CM

## 2015-01-16 NOTE — Telephone Encounter (Signed)
Pt need a referral to see  Dr Jeffie Pollock at [facility]

## 2015-01-16 NOTE — Telephone Encounter (Signed)
Order for referral to Urologist done.

## 2015-01-25 DIAGNOSIS — R1032 Left lower quadrant pain: Secondary | ICD-10-CM | POA: Diagnosis not present

## 2015-01-25 DIAGNOSIS — R312 Other microscopic hematuria: Secondary | ICD-10-CM | POA: Diagnosis not present

## 2015-02-06 DIAGNOSIS — N952 Postmenopausal atrophic vaginitis: Secondary | ICD-10-CM | POA: Diagnosis not present

## 2015-02-06 DIAGNOSIS — N8329 Other ovarian cysts: Secondary | ICD-10-CM | POA: Diagnosis not present

## 2015-02-17 ENCOUNTER — Telehealth: Payer: Self-pay | Admitting: *Deleted

## 2015-02-17 MED ORDER — ALPRAZOLAM 0.5 MG PO TABS: ORAL_TABLET | ORAL | Status: DC

## 2015-02-17 NOTE — Telephone Encounter (Signed)
Patient is requesting a refill of Alprazolam 0.5 mg  [place]

## 2015-02-17 NOTE — Telephone Encounter (Signed)
Rx faxed to [facility] 

## 2015-02-21 ENCOUNTER — Telehealth: Payer: Self-pay | Admitting: *Deleted

## 2015-02-21 NOTE — Telephone Encounter (Signed)
Patient is requesting a refill of ALPRAZolam (XANAX) 0.5 MG tablet. She would like it switched to [place]  Telephone [phone] or Fax [phone]

## 2015-02-21 NOTE — Telephone Encounter (Signed)
[name] pt was not in office on 02-17-15

## 2015-02-21 NOTE — Telephone Encounter (Signed)
Spoke to pt, asked her if she has the Rx I gave her at visit 6/17 for the Alprazolam? Pt said she will have to look. Told her if she does she can send it to [facility]. Pt verbalized understanding and will call back if she does not find it. Told her okay.

## 2015-02-21 NOTE — Telephone Encounter (Signed)
Spoke to pt, told her sorry, she was not here I faxed the Rx for Alprazolam to [facility] on 6/17. Pt verbalized understanding.

## 2015-02-28 DIAGNOSIS — Z1231 Encounter for screening mammogram for malignant neoplasm of breast: Secondary | ICD-10-CM | POA: Diagnosis not present

## 2015-05-25 ENCOUNTER — Encounter: Payer: Self-pay | Admitting: Internal Medicine

## 2015-05-25 ENCOUNTER — Ambulatory Visit (INDEPENDENT_AMBULATORY_CARE_PROVIDER_SITE_OTHER): Payer: Commercial Managed Care - HMO | Admitting: Internal Medicine

## 2015-05-25 VITALS — BP 130/80 | HR 72 | Temp 98.1°F | Resp 20 | Ht 66.0 in | Wt 143.0 lb

## 2015-05-25 DIAGNOSIS — R1310 Dysphagia, unspecified: Secondary | ICD-10-CM | POA: Diagnosis not present

## 2015-05-25 DIAGNOSIS — Z23 Encounter for immunization: Secondary | ICD-10-CM | POA: Diagnosis not present

## 2015-05-25 DIAGNOSIS — E039 Hypothyroidism, unspecified: Secondary | ICD-10-CM

## 2015-05-25 NOTE — Patient Instructions (Signed)
Report any new or worsening symptoms  Annual exam in 2-3 months

## 2015-05-25 NOTE — Progress Notes (Signed)
Pre visit review using our clinic review tool, if applicable. No additional management support is needed unless otherwise documented below in the visit note. 

## 2015-05-25 NOTE — Progress Notes (Signed)
Subjective:    Patient ID: [name], female    DOB: 03-10-1941, 74 y.o.   MRN: [id number]  HPI  Wt Readings from Last 3 Encounters:  05/25/15 143 lb (64.864 kg)  01/12/15 144 lb 14.4 oz (65.726 kg)  09/22/14 144 lb (65.52 kg)   74 year old patient who has a history of hypothyroidism who is seen today for evaluation of swallowing concerns.  She states that she has a long history of swallowing difficulties and episodes of choking for a number of years.  For the past 3 or 4 months.  She has had some increasing episodes of " Becoming strangled".  Symptoms are much worse with liquids but more recently has had some difficulty with solid foods, especially vegetables.  There is been no pain associated with swallowing, anorexia, weight loss or other constitutional complaints.  She is followed by GI for colonic polyps; no vomiting.  Past Medical History  Diagnosis Date  . Hyperlipidemia   . Hx of colonoscopy with polypectomy   . Thyroid disease     Social History   Social History  . Marital Status: Married    Spouse Name: N/A  . Number of Children: N/A  . Years of Education: N/A   Occupational History  . Not on file.   Social History Main Topics  . Smoking status: Former Research scientist (life sciences)  . Smokeless tobacco: Never Used     Comment: quit in 1980  . Alcohol Use: No  . Drug Use: No  . Sexual Activity: Not on file   Other Topics Concern  . Not on file   Social History Narrative    Past Surgical History  Procedure Laterality Date  . Appendectomy    . Abdominal hysterectomy    . Breast surgery      LUMPECTOMY- FIBROID TUMOR  . Enterocele repair    . Excisional hemorrhoidectomy      Family History  Problem Relation Age of Onset  . Colon cancer Mother 53  . Heart disease Father   . Colon cancer Brother 53  . Colon cancer Maternal Aunt 69  . Colon cancer Son 72  . Colon cancer Paternal Grandmother   . Colon cancer Other 32    Allergies  Allergen Reactions  . Crab  [Shellfish Allergy] Other (See Comments)    Severe stomach pains from crab    Current Outpatient Prescriptions on File Prior to Visit  Medication Sig Dispense Refill  . ALPRAZolam (XANAX) 0.5 MG tablet TAKE ONE TABLET BY MOUTH ONCE DAILY AS NEEDED 90 tablet 1  . aspirin EC 81 MG tablet Take 81 mg by mouth daily.    Marland Kitchen CALCIUM-MAGNESIUM-VITAMIN D PO Take 1 tablet by mouth 2 (two) times daily.    . Cholecalciferol (VITAMIN D) 2000 UNITS tablet Take 2,000 Units by mouth daily.    Marland Kitchen conjugated estrogens (PREMARIN) vaginal cream Place 1 gram vaginally 1-2 times weekly 42.5 g 5  . estradiol (ESTRACE VAGINAL) 0.1 MG/GM vaginal cream Place 0.86 Applicatorfuls vaginally once a week. (Patient taking differently: Place 0.5 Applicatorfuls vaginally 2 (two) times a week. Tuesday and Friday) 42.5 g 3  . levothyroxine (SYNTHROID, LEVOTHROID) 75 MCG tablet TAKE  1  TABLET  DAILY (Patient taking differently: Take 75 mcg by mouth daily before breakfast. ) 90 tablet 3  . MAGNESIUM PO Take 1 tablet by mouth daily.    Marland Kitchen OVER THE COUNTER MEDICATION Take 1 capsule by mouth at bedtime. OTC SLEEP AID    . simvastatin (ZOCOR)  20 MG tablet Take 1 tablet (20 mg total) by mouth daily. (Patient taking differently: Take 20 mg by mouth at bedtime. ) 90 tablet 3   No current facility-administered medications on file prior to visit.    BP 130/80 mmHg  Pulse 72  Temp(Src) 98.1 F (36.7 C) (Oral)  Resp 20  Ht 5\' 6"  (1.676 m)  Wt 143 lb (64.864 kg)  BMI 23.09 kg/m2  SpO2 98%     Review of Systems  Constitutional: Negative.   HENT: Negative for congestion, dental problem, hearing loss, rhinorrhea, sinus pressure, sore throat and tinnitus.   Eyes: Negative for pain, discharge and visual disturbance.  Respiratory: Negative for cough and shortness of breath.   Cardiovascular: Negative for chest pain, palpitations and leg swelling.  Gastrointestinal: Negative for nausea, vomiting, abdominal pain, diarrhea, constipation,  blood in stool and abdominal distention.  Genitourinary: Negative for dysuria, urgency, frequency, hematuria, flank pain, vaginal bleeding, vaginal discharge, difficulty urinating, vaginal pain and pelvic pain.  Musculoskeletal: Negative for joint swelling, arthralgias and gait problem.  Skin: Negative for rash.  Neurological: Negative for dizziness, syncope, speech difficulty, weakness, numbness and headaches.  Hematological: Negative for adenopathy.  Psychiatric/Behavioral: Negative for behavioral problems, dysphoric mood and agitation. The patient is not nervous/anxious.        Objective:   Physical Exam  Constitutional: She is oriented to person, place, and time. She appears well-developed and well-nourished.  HENT:  Head: Normocephalic.  Right Ear: External ear normal.  Left Ear: External ear normal.  Mouth/Throat: Oropharynx is clear and moist.  Eyes: Conjunctivae and EOM are normal. Pupils are equal, round, and reactive to light.  Neck: Normal range of motion. Neck supple. No thyromegaly present.  Cardiovascular: Normal rate, regular rhythm, normal heart sounds and intact distal pulses.   Pulmonary/Chest: Effort normal and breath sounds normal.  Abdominal: Soft. Bowel sounds are normal. She exhibits no mass. There is no tenderness.  Musculoskeletal: Normal range of motion.  Lymphadenopathy:    She has no cervical adenopathy.  Neurological: She is alert and oriented to person, place, and time.  Skin: Skin is warm and dry. No rash noted.  Psychiatric: She has a normal mood and affect. Her behavior is normal.          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  Options discussed including GI referral, referral for swallowing study including barium swallow.  Patient wishes to observe at this time and will consider evaluation if becomes more problematic, we'll reassess at the time of her physical..  She will report any new or worsening symptoms

## 2015-06-08 DIAGNOSIS — H5203 Hypermetropia, bilateral: Secondary | ICD-10-CM | POA: Diagnosis not present

## 2015-06-08 DIAGNOSIS — H5210 Myopia, unspecified eye: Secondary | ICD-10-CM | POA: Diagnosis not present

## 2015-07-20 ENCOUNTER — Telehealth: Payer: Self-pay | Admitting: Internal Medicine

## 2015-07-20 NOTE — Telephone Encounter (Signed)
Noted  

## 2015-07-20 NOTE — Telephone Encounter (Signed)
Patient Name: [name] DOB: [birth year] Initial Comment Caller states, wants an appt to see the Dr, sharp chest pains, Sometimes she wakes up with pains in side and back too. Nurse Assessment Nurse: Ronnald Ramp, RN, Miranda Date/Time (Eastern Time): 07/20/2015 10:00:58 AM Confirm and document reason for call. If symptomatic, describe symptoms. ---Caller states she had an episode of sharp pain in her back and her left side for about 1 min last week. That pain has not returned. She has had slight chest pain for "a long time but I thought it was just my nerves" but they are happening more frequently. The pain only last a few seconds. Has the patient traveled out of the country within the last 30 days? ---Not Applicable Does the patient have any new or worsening symptoms? ---Yes Will a triage be completed? ---Yes Related visit to physician within the last 2 weeks? ---No Does the PT have any chronic conditions? (i.e. diabetes, asthma, etc.) ---Yes List chronic conditions. ---Thyroid, High Cholesterol, Anxiety Is this a behavioral health call? ---No Guidelines Guideline Title Affirmed Question Affirmed Notes Chest Pain [1] Chest pain(s) lasting a few seconds AND [2] persists > 3 days Final Disposition User See PCP When Office is Open (within 3 days) Ronnald Ramp, RN, Miranda Comments Not having chest pain today No appts available with PCP in recommended time frame. Appt scheduled for tomorrow morning 11/18 at 10:30 with Dr. Sarajane Jews. Referrals REFERRED TO PCP OFFICE Disagree/Comply: Comply

## 2015-07-21 ENCOUNTER — Encounter: Payer: Self-pay | Admitting: Family Medicine

## 2015-07-21 ENCOUNTER — Ambulatory Visit (INDEPENDENT_AMBULATORY_CARE_PROVIDER_SITE_OTHER): Payer: Commercial Managed Care - HMO | Admitting: Family Medicine

## 2015-07-21 VITALS — BP 150/76 | HR 68 | Temp 99.5°F | Ht 66.0 in | Wt 144.0 lb

## 2015-07-21 DIAGNOSIS — F411 Generalized anxiety disorder: Secondary | ICD-10-CM

## 2015-07-21 DIAGNOSIS — R0789 Other chest pain: Secondary | ICD-10-CM

## 2015-07-21 MED ORDER — ALPRAZOLAM 0.5 MG PO TABS: 0.5000 mg | ORAL_TABLET | Freq: Three times a day (TID) | ORAL | Status: DC | PRN

## 2015-07-21 NOTE — Progress Notes (Signed)
Pre visit review using our clinic review tool, if applicable. No additional management support is needed unless otherwise documented below in the visit note. 

## 2015-07-21 NOTE — Progress Notes (Signed)
   Subjective:    Patient ID: DESHONE SHERBA, female    DOB: 04-20-1941, 74 y.o.   MRN: [id number]  HPI Here for several weeks of intermittent chest pains that she thinks are related to stress. She says her husband is probably showing some early dementia and that he has become a great burden to her. He forgets many things and she is always going around picking up after him. He has also become more emotionally labile and he gets angry at her and argues with her a lot. When they argue she feels a tightness in her chest which is sometimes accompanied by fleeting sharp pains. These pains only last a few minutes and then go away. There is no SOB or sweats or nausea. She denies any heartburn. She never feels these pains when she exerts herself or exercises.    Review of Systems  Constitutional: Negative.   Respiratory: Negative.   Cardiovascular: Positive for chest pain. Negative for palpitations and leg swelling.  Gastrointestinal: Negative.   Psychiatric/Behavioral: Negative for hallucinations, behavioral problems, confusion, dysphoric mood, decreased concentration and agitation. The patient is nervous/anxious.        Objective:   Physical Exam  Constitutional: She is oriented to person, place, and time. She appears well-developed and well-nourished. No distress.  Neck: No thyromegaly present.  Cardiovascular: Normal rate, regular rhythm, normal heart sounds and intact distal pulses.   EKG normal   Pulmonary/Chest: Effort normal and breath sounds normal. She exhibits no tenderness.  Abdominal: Soft. Bowel sounds are normal. She exhibits no distension and no mass. There is no tenderness. There is no rebound and no guarding.  Lymphadenopathy:    She has no cervical adenopathy.  Neurological: She is alert and oriented to person, place, and time.  Psychiatric: Her behavior is normal. Thought content normal.  Tearful and anxious           Assessment & Plan:  I agree that these chest pains  are probably related to stress and do not indicate a cardiac issue. I recommended she increase her Xanax from 1/2 tablet BID to a full tablet TID as needed, and she will follow up with Dr. Burnice Logan soon

## 2015-07-24 ENCOUNTER — Telehealth: Payer: Self-pay | Admitting: Family Medicine

## 2015-07-24 MED ORDER — ALPRAZOLAM 0.5 MG PO TABS: 0.5000 mg | ORAL_TABLET | Freq: Three times a day (TID) | ORAL | Status: DC | PRN

## 2015-07-24 NOTE — Telephone Encounter (Signed)
Per Dr. Sarajane Jews take Xanax three times a day as needed, just need to change in chart.

## 2015-07-24 NOTE — Telephone Encounter (Signed)
Pharmacy called to clarify directions on Xanax.

## 2015-07-24 NOTE — Telephone Encounter (Signed)
I spoke with pharmacy and gave new order and updated medication list.

## 2015-07-26 ENCOUNTER — Other Ambulatory Visit: Payer: Self-pay | Admitting: Internal Medicine

## 2015-09-20 DIAGNOSIS — L578 Other skin changes due to chronic exposure to nonionizing radiation: Secondary | ICD-10-CM | POA: Diagnosis not present

## 2015-09-20 DIAGNOSIS — L570 Actinic keratosis: Secondary | ICD-10-CM | POA: Diagnosis not present

## 2015-09-20 DIAGNOSIS — D1801 Hemangioma of skin and subcutaneous tissue: Secondary | ICD-10-CM | POA: Diagnosis not present

## 2015-09-20 DIAGNOSIS — L820 Inflamed seborrheic keratosis: Secondary | ICD-10-CM | POA: Diagnosis not present

## 2015-09-25 ENCOUNTER — Other Ambulatory Visit: Payer: Self-pay | Admitting: Adult Health

## 2015-10-05 ENCOUNTER — Other Ambulatory Visit: Payer: Self-pay | Admitting: Internal Medicine

## 2015-10-09 ENCOUNTER — Encounter: Payer: Self-pay | Admitting: Internal Medicine

## 2015-10-09 ENCOUNTER — Ambulatory Visit (INDEPENDENT_AMBULATORY_CARE_PROVIDER_SITE_OTHER): Payer: Commercial Managed Care - HMO | Admitting: Internal Medicine

## 2015-10-09 VITALS — BP 130/74 | HR 76 | Temp 98.4°F | Resp 20 | Ht 66.0 in | Wt 150.0 lb

## 2015-10-09 DIAGNOSIS — Z86010 Personal history of colonic polyps: Secondary | ICD-10-CM

## 2015-10-09 DIAGNOSIS — N952 Postmenopausal atrophic vaginitis: Secondary | ICD-10-CM

## 2015-10-09 DIAGNOSIS — E039 Hypothyroidism, unspecified: Secondary | ICD-10-CM

## 2015-10-09 DIAGNOSIS — E7800 Pure hypercholesterolemia, unspecified: Secondary | ICD-10-CM

## 2015-10-09 DIAGNOSIS — R350 Frequency of micturition: Secondary | ICD-10-CM

## 2015-10-09 DIAGNOSIS — R300 Dysuria: Secondary | ICD-10-CM | POA: Diagnosis not present

## 2015-10-09 LAB — CBC WITH DIFFERENTIAL/PLATELET
Basophils Absolute: 0 10*3/uL (ref 0.0–0.1)
Basophils Relative: 0.3 % (ref 0.0–3.0)
EOS ABS: 0.1 10*3/uL (ref 0.0–0.7)
Eosinophils Relative: 1.7 % (ref 0.0–5.0)
HCT: 42.9 % (ref 36.0–46.0)
Hemoglobin: 14.1 g/dL (ref 12.0–15.0)
Lymphocytes Relative: 18.4 % (ref 12.0–46.0)
Lymphs Abs: 1.3 10*3/uL (ref 0.7–4.0)
MCHC: 33 g/dL (ref 30.0–36.0)
MCV: 91.8 fl (ref 78.0–100.0)
MONO ABS: 0.4 10*3/uL (ref 0.1–1.0)
Monocytes Relative: 5.5 % (ref 3.0–12.0)
NEUTROS ABS: 5.1 10*3/uL (ref 1.4–7.7)
NEUTROS PCT: 74.1 % (ref 43.0–77.0)
PLATELETS: 198 10*3/uL (ref 150.0–400.0)
RBC: 4.67 Mil/uL (ref 3.87–5.11)
RDW: 12.8 % (ref 11.5–15.5)
WBC: 6.8 10*3/uL (ref 4.0–10.5)

## 2015-10-09 LAB — COMPREHENSIVE METABOLIC PANEL
ALBUMIN: 4 g/dL (ref 3.5–5.2)
ALT: 14 U/L (ref 0–35)
AST: 19 U/L (ref 0–37)
Alkaline Phosphatase: 60 U/L (ref 39–117)
BUN: 17 mg/dL (ref 6–23)
CO2: 30 meq/L (ref 19–32)
CREATININE: 0.8 mg/dL (ref 0.40–1.20)
Calcium: 9.1 mg/dL (ref 8.4–10.5)
Chloride: 106 mEq/L (ref 96–112)
GFR: 74.11 mL/min (ref >60.00)
GLUCOSE: 84 mg/dL (ref 70–99)
POTASSIUM: 4.1 meq/L (ref 3.5–5.1)
SODIUM: 140 meq/L (ref 135–145)
Total Bilirubin: 0.5 mg/dL (ref 0.2–1.2)
Total Protein: 6.3 g/dL (ref 6.0–8.3)

## 2015-10-09 LAB — POC URINALSYSI DIPSTICK (AUTOMATED)
BILIRUBIN UA: NEGATIVE
Glucose, UA: NEGATIVE
Ketones, UA: NEGATIVE
NITRITE UA: NEGATIVE
PH UA: 6
PROTEIN UA: NEGATIVE
Spec Grav, UA: 1.01
UROBILINOGEN UA: 0.2

## 2015-10-09 LAB — TSH: TSH: 0.47 u[IU]/mL (ref 0.35–4.50)

## 2015-10-09 MED ORDER — NITROFURANTOIN MONOHYD MACRO 100 MG PO CAPS
100.0000 mg | ORAL_CAPSULE | Freq: Two times a day (BID) | ORAL | Status: DC
Start: 2015-10-09 — End: 2016-06-26

## 2015-10-09 MED ORDER — ALPRAZOLAM 0.5 MG PO TABS: 0.5000 mg | ORAL_TABLET | Freq: Three times a day (TID) | ORAL | Status: DC | PRN

## 2015-10-09 NOTE — Patient Instructions (Signed)
Drink as much fluid as you  can tolerate over the next few days  Take your antibiotic as prescribed until ALL of it is gone, but stop if you develop a rash, swelling, or any side effects of the medication.  Contact our office as soon as possible if  there are side effects of the medication.  Return in 6 months for follow-up   discontinued simvastatin

## 2015-10-09 NOTE — Progress Notes (Signed)
Subjective:    Patient ID: [name], female    DOB: [birth year], 75 y.o.   MRN: [id number]  HPI   75 year old patient who presents with chief complaints of urinary frequency, urgency, and mild dysuria.  She also complains of some low back pain.  She complains of situational stress.  She complains of headache, neck pain and also some hair loss.  She has hypothyroidism but no recent laboratory testing. She has been compliant with her medication  She does have a history mild dyslipidemia.  She is placed on simvastatin by GYN.  A number of years ago. She take this medication only sporadically due to perceived side effects of mental clouding.  Current guidelines for treating hypercholesterolemia discussed.  T discontinue this medication  Past Medical History  Diagnosis Date  . Hyperlipidemia   . Hx of colonoscopy with polypectomy   . Thyroid disease     Social History   Social History  . Marital Status: Married    Spouse Name: N/A  . Number of Children: N/A  . Years of Education: N/A   Occupational History  . Not on file.   Social History Main Topics  . Smoking status: Former Research scientist (life sciences)  . Smokeless tobacco: Never Used     Comment: quit in 1980  . Alcohol Use: No  . Drug Use: No  . Sexual Activity: Not on file   Other Topics Concern  . Not on file   Social History Narrative    Past Surgical History  Procedure Laterality Date  . Appendectomy    . Abdominal hysterectomy    . Breast surgery      LUMPECTOMY- FIBROID TUMOR  . Enterocele repair    . Excisional hemorrhoidectomy      Family History  Problem Relation Age of Onset  . Colon cancer Mother 16  . Heart disease Father   . Colon cancer Brother 55  . Colon cancer Maternal Aunt 45  . Colon cancer Son 67  . Colon cancer Paternal Grandmother   . Colon cancer Other 32    Allergies  Allergen Reactions  . Crab [Shellfish Allergy] Other (See Comments)    Severe stomach pains from crab    Current Outpatient  Prescriptions on File Prior to Visit  Medication Sig Dispense Refill  . ALPRAZolam (XANAX) 0.5 MG tablet Take 1 tablet (0.5 mg total) by mouth 3 (three) times daily as needed for anxiety. 90 tablet 0  . aspirin EC 81 MG tablet Take 81 mg by mouth daily.    Marland Kitchen CALCIUM-MAGNESIUM-VITAMIN D PO Take 1 tablet by mouth 2 (two) times daily.    . Cholecalciferol (VITAMIN D) 2000 UNITS tablet Take 2,000 Units by mouth daily.    Marland Kitchen conjugated estrogens (PREMARIN) vaginal cream Place 1 gram vaginally 1-2 times weekly 42.5 g 5  . estradiol (ESTRACE VAGINAL) 0.1 MG/GM vaginal cream Place [id number] Applicatorfuls vaginally once a week. 42.5 g 3  . levothyroxine (SYNTHROID, LEVOTHROID) 75 MCG tablet TAKE 1 TABLET EVERY DAY (NEED MD APPOINTMENT) 90 tablet 0  . MAGNESIUM PO Take 1 tablet by mouth daily.    Marland Kitchen OVER THE COUNTER MEDICATION Take 1 capsule by mouth at bedtime. OTC SLEEP AID    . simvastatin (ZOCOR) 20 MG tablet TAKE 1 TABLET EVERY DAY 90 tablet 0   No current facility-administered medications on file prior to visit.    BP 130/74 mmHg  Pulse 76  Temp(Src) 98.4 F (36.9 C) (Oral)  Resp 20  Ht 5'  6" (1.676 m)  Wt 150 lb (68.04 kg)  BMI 24.22 kg/m2  SpO2 98%     Review of Systems  Constitutional: Negative.   HENT: Negative for congestion, dental problem, hearing loss, rhinorrhea, sinus pressure, sore throat and tinnitus.   Eyes: Negative for pain, discharge and visual disturbance.  Respiratory: Negative for cough and shortness of breath.   Cardiovascular: Negative for chest pain, palpitations and leg swelling.  Gastrointestinal: Negative for nausea, vomiting, abdominal pain, diarrhea, constipation, blood in stool and abdominal distention.  Genitourinary: Positive for dysuria, urgency and frequency. Negative for hematuria, flank pain, vaginal bleeding, vaginal discharge, difficulty urinating, vaginal pain and pelvic pain.  Musculoskeletal: Negative for joint swelling, arthralgias and gait problem.    Skin: Negative for rash.  Neurological: Negative for dizziness, syncope, speech difficulty, weakness, numbness and headaches.  Hematological: Negative for adenopathy.  Psychiatric/Behavioral: Positive for dysphoric mood and decreased concentration. Negative for behavioral problems and agitation. The patient is nervous/anxious.        Objective:   Physical Exam  Constitutional: She is oriented to person, place, and time. She appears well-developed and well-nourished.  HENT:  Head: Normocephalic.  Right Ear: External ear normal.  Left Ear: External ear normal.  Mouth/Throat: Oropharynx is clear and moist.  Eyes: Conjunctivae and EOM are normal. Pupils are equal, round, and reactive to light.  Neck: Normal range of motion. Neck supple. No thyromegaly present.  Cardiovascular: Normal rate, regular rhythm, normal heart sounds and intact distal pulses.   Pulmonary/Chest: Effort normal and breath sounds normal.  Abdominal: Soft. Bowel sounds are normal. She exhibits no mass. There is no tenderness.  Musculoskeletal: Normal range of motion.  Lymphadenopathy:    She has no cervical adenopathy.  Neurological: She is alert and oriented to person, place, and time.  Skin: Skin is warm and dry. No rash noted.   No obvious hair loss noted  Psychiatric: She has a normal mood and affect. Her behavior is normal.          Assessment & Plan:    hypothyroidism.  We'll check a TSH  Dyslipidemia. Will discontinue statin therapy we'll check a lipid profile at the time of her CPX  Anxiety disorder.  Alprazolam refilled  UTI.  Will treat with Macrobid for 5 days

## 2015-10-09 NOTE — Progress Notes (Signed)
Pre visit review using our clinic review tool, if applicable. No additional management support is needed unless otherwise documented below in the visit note. 

## 2015-11-20 ENCOUNTER — Other Ambulatory Visit: Payer: Self-pay | Admitting: Adult Health

## 2015-12-07 DIAGNOSIS — N390 Urinary tract infection, site not specified: Secondary | ICD-10-CM | POA: Diagnosis not present

## 2015-12-07 DIAGNOSIS — N958 Other specified menopausal and perimenopausal disorders: Secondary | ICD-10-CM | POA: Diagnosis not present

## 2015-12-07 DIAGNOSIS — R102 Pelvic and perineal pain: Secondary | ICD-10-CM | POA: Diagnosis not present

## 2015-12-07 DIAGNOSIS — M8588 Other specified disorders of bone density and structure, other site: Secondary | ICD-10-CM | POA: Diagnosis not present

## 2015-12-15 ENCOUNTER — Other Ambulatory Visit: Payer: Self-pay | Admitting: Internal Medicine

## 2015-12-20 DIAGNOSIS — Z8544 Personal history of malignant neoplasm of other female genital organs: Secondary | ICD-10-CM | POA: Diagnosis not present

## 2015-12-20 DIAGNOSIS — R1032 Left lower quadrant pain: Secondary | ICD-10-CM | POA: Diagnosis not present

## 2015-12-20 DIAGNOSIS — N83202 Unspecified ovarian cyst, left side: Secondary | ICD-10-CM | POA: Diagnosis not present

## 2016-04-02 ENCOUNTER — Other Ambulatory Visit: Payer: Self-pay | Admitting: Internal Medicine

## 2016-04-02 NOTE — Telephone Encounter (Signed)
Okay to refill? 

## 2016-04-02 NOTE — Telephone Encounter (Signed)
Rx printed for signature & will be faxed.

## 2016-05-10 ENCOUNTER — Ambulatory Visit: Payer: Commercial Managed Care - HMO

## 2016-06-14 ENCOUNTER — Other Ambulatory Visit: Payer: Self-pay | Admitting: Adult Health

## 2016-06-14 MED ORDER — LEVOTHYROXINE SODIUM 75 MCG PO TABS
75.0000 ug | ORAL_TABLET | Freq: Every day | ORAL | 1 refills | Status: DC
Start: 2016-06-14 — End: 2016-11-18

## 2016-06-25 DIAGNOSIS — Z0389 Encounter for observation for other suspected diseases and conditions ruled out: Secondary | ICD-10-CM | POA: Diagnosis not present

## 2016-06-25 DIAGNOSIS — N83202 Unspecified ovarian cyst, left side: Secondary | ICD-10-CM | POA: Diagnosis not present

## 2016-06-26 ENCOUNTER — Encounter: Payer: Self-pay | Admitting: Internal Medicine

## 2016-06-26 ENCOUNTER — Ambulatory Visit (INDEPENDENT_AMBULATORY_CARE_PROVIDER_SITE_OTHER): Payer: Commercial Managed Care - HMO | Admitting: Internal Medicine

## 2016-06-26 VITALS — BP 134/80 | HR 68 | Temp 98.2°F | Resp 18 | Ht 66.0 in | Wt 142.2 lb

## 2016-06-26 DIAGNOSIS — Z23 Encounter for immunization: Secondary | ICD-10-CM

## 2016-06-26 DIAGNOSIS — E038 Other specified hypothyroidism: Secondary | ICD-10-CM | POA: Diagnosis not present

## 2016-06-26 DIAGNOSIS — M79605 Pain in left leg: Secondary | ICD-10-CM

## 2016-06-26 MED ORDER — MELOXICAM 15 MG PO TABS: 15.0000 mg | ORAL_TABLET | Freq: Every day | ORAL | 0 refills | Status: DC

## 2016-06-26 NOTE — Patient Instructions (Addendum)
You  may move around, but avoid painful motions and activities.  Apply heat  to the sore area for 15 to 20 minutes 3 or 4 times daily for the next two to 3 days.  Call or return to clinic prn if these symptoms worsen or fail to improve as anticipated.  

## 2016-06-26 NOTE — Progress Notes (Signed)
Pre visit review using our clinic review tool, if applicable. No additional management support is needed unless otherwise documented below in the visit note. 

## 2016-06-26 NOTE — Progress Notes (Signed)
Subjective:    Patient ID: [name], female    DOB: 1941/06/20, 75 y.o.   MRN: [id number]  HPI   75 year old patient who enjoys excellent health.  She has a history of hypothyroidism.  She presents with a 4 week history of intermittent pain involving the left medial thigh area.  There has been no history of trauma or overuse type activities.  She states that pain is aggravated by flexion of the left hip, such as walking up stairs, but there is no pain with walking on a level surface.  No constitutional complaints  Past Medical History:  Diagnosis Date  . Hx of colonoscopy with polypectomy   . Hyperlipidemia   . Thyroid disease      Social History   Social History  . Marital status: Married    Spouse name: N/A  . Number of children: N/A  . Years of education: N/A   Occupational History  . Not on file.   Social History Main Topics  . Smoking status: Former Research scientist (life sciences)  . Smokeless tobacco: Never Used     Comment: quit in 1980  . Alcohol use No  . Drug use: No  . Sexual activity: Not on file   Other Topics Concern  . Not on file   Social History Narrative  . No narrative on file    Past Surgical History:  Procedure Laterality Date  . ABDOMINAL HYSTERECTOMY    . APPENDECTOMY    . BREAST SURGERY     LUMPECTOMY- FIBROID TUMOR  . ENTEROCELE REPAIR    . EXCISIONAL HEMORRHOIDECTOMY      Family History  Problem Relation Age of Onset  . Colon cancer Mother 53  . Heart disease Father   . Colon cancer Brother 65  . Colon cancer Maternal Aunt 31  . Colon cancer Son 35  . Colon cancer Paternal Grandmother   . Colon cancer Other 32    Allergies  Allergen Reactions  . Crab [Shellfish Allergy] Other (See Comments)    Severe stomach pains from crab    Current Outpatient Prescriptions on File Prior to Visit  Medication Sig Dispense Refill  . ALPRAZolam (XANAX) 0.5 MG tablet TAKE 1 TABLET THREE TIMES DAILY AS NEEDED  FOR  ANXIETY 90 tablet 1  . aspirin EC 81 MG  tablet Take 81 mg by mouth daily.    Marland Kitchen CALCIUM-MAGNESIUM-VITAMIN D PO Take 1 tablet by mouth 2 (two) times daily.    . Cholecalciferol (VITAMIN D) 2000 UNITS tablet Take 2,000 Units by mouth daily.    Marland Kitchen conjugated estrogens (PREMARIN) vaginal cream Place 1 gram vaginally 1-2 times weekly 42.5 g 5  . estradiol (ESTRACE VAGINAL) 0.1 MG/GM vaginal cream Place [id number] Applicatorfuls vaginally once a week. 42.5 g 3  . levothyroxine (SYNTHROID, LEVOTHROID) 75 MCG tablet Take 1 tablet (75 mcg total) by mouth daily. 90 tablet 1  . MAGNESIUM PO Take 1 tablet by mouth daily.    Marland Kitchen OVER THE COUNTER MEDICATION Take 1 capsule by mouth at bedtime. OTC SLEEP AID     No current facility-administered medications on file prior to visit.     BP 134/80 (BP Location: Right Arm, Patient Position: Sitting, Cuff Size: Normal)   Pulse 68   Temp 98.2 F (36.8 C) (Oral)   Resp 18   Ht 5\' 6"  (1.676 m)   Wt 142 lb 4 oz (64.5 kg)   BMI 22.96 kg/m     Review of Systems  Constitutional: Negative.  HENT: Negative for congestion, dental problem, hearing loss, rhinorrhea, sinus pressure, sore throat and tinnitus.   Eyes: Negative for pain, discharge and visual disturbance.  Respiratory: Negative for cough and shortness of breath.   Cardiovascular: Negative for chest pain, palpitations and leg swelling.  Gastrointestinal: Negative for abdominal distention, abdominal pain, blood in stool, constipation, diarrhea, nausea and vomiting.  Genitourinary: Negative for difficulty urinating, dysuria, flank pain, frequency, hematuria, pelvic pain, urgency, vaginal bleeding, vaginal discharge and vaginal pain.  Musculoskeletal: Negative for arthralgias, gait problem and joint swelling.       Left medial leg pain  Skin: Negative for rash.  Neurological: Negative for dizziness, syncope, speech difficulty, weakness, numbness and headaches.  Hematological: Negative for adenopathy.  Psychiatric/Behavioral: Negative for agitation,  behavioral problems and dysphoric mood. The patient is not nervous/anxious.        Objective:   Physical Exam  Constitutional: She appears well-developed and well-nourished. No distress.  Musculoskeletal:  Full range of motion of the left hip Exam of the left upper leg revealed no abnormalities.  There is no local tenderness or erythema The left groin was unremarkable Passive range of motion of the left hip revealed no discomfort Active hip flexion with the patient sitting did tend to aggravate the discomfort          Assessment & Plan:   .  Left medial leg pain.  Appears to be more musculoligamentous.  Will treat with short-term .  Mobic and observe.   Will call if unimproved .  CPX in the  [place]

## 2016-07-05 ENCOUNTER — Telehealth: Payer: Self-pay | Admitting: Internal Medicine

## 2016-07-05 MED ORDER — ALPRAZOLAM 0.5 MG PO TABS: ORAL_TABLET | ORAL | 1 refills | Status: DC

## 2016-07-05 NOTE — Telephone Encounter (Signed)
Rx for Alprazolam faxed to [facility] order.

## 2016-07-05 NOTE — Telephone Encounter (Signed)
° ° °  Pt request refill of the following:  ALPRAZolam (XANAX) 0.5 MG tablet   [facility] will need a new rx to refill this medicine     Phamacy:

## 2016-08-09 ENCOUNTER — Ambulatory Visit: Payer: Commercial Managed Care - HMO | Admitting: Internal Medicine

## 2016-08-13 ENCOUNTER — Ambulatory Visit (INDEPENDENT_AMBULATORY_CARE_PROVIDER_SITE_OTHER): Payer: Commercial Managed Care - HMO | Admitting: Internal Medicine

## 2016-08-13 ENCOUNTER — Encounter: Payer: Self-pay | Admitting: Internal Medicine

## 2016-08-13 VITALS — BP 146/70 | HR 84 | Temp 98.8°F | Ht 66.0 in | Wt 144.2 lb

## 2016-08-13 DIAGNOSIS — E7800 Pure hypercholesterolemia, unspecified: Secondary | ICD-10-CM

## 2016-08-13 DIAGNOSIS — M545 Low back pain, unspecified: Secondary | ICD-10-CM

## 2016-08-13 DIAGNOSIS — N952 Postmenopausal atrophic vaginitis: Secondary | ICD-10-CM | POA: Diagnosis not present

## 2016-08-13 DIAGNOSIS — R300 Dysuria: Secondary | ICD-10-CM

## 2016-08-13 LAB — POC URINALSYSI DIPSTICK (AUTOMATED)
Bilirubin, UA: NEGATIVE
Glucose, UA: NEGATIVE
Ketones, UA: NEGATIVE
Nitrite, UA: POSITIVE
Protein, UA: NEGATIVE
Spec Grav, UA: 1.01
Urobilinogen, UA: 0.2
pH, UA: 7

## 2016-08-13 NOTE — Progress Notes (Signed)
Subjective:    Patient ID: [name], female    DOB: [birth year], 75 y.o.   MRN: [id number]  HPI  75 year old patient who has a history of hypothyroidism, as well as dyslipidemia. She also has a history of postmenopausal urogenital atrophy and does use topical vaginal estrogen creams. 2 weeks ago the patient noted some dysuria.  She took cranberry juice for a couple days and this has resolved and has not reoccurred  7 days ago, she noticed some low back pain that also has resolved and not reoccurred.  She has been concerned about a urinary tract infection. She has had multiple urine cultures over the past few years that have been negative.  Urinalyses have revealed microscopic hematuria, which has been chronic and evaluated in the past.  There is usually trace pyuria but again negative.  Subsequent  urine cultures.  The patient has been symptom free for 7 days  Past Medical History:  Diagnosis Date  . Hx of colonoscopy with polypectomy   . Hyperlipidemia   . Thyroid disease      Social History   Social History  . Marital status: Married    Spouse name: N/A  . Number of children: N/A  . Years of education: N/A   Occupational History  . Not on file.   Social History Main Topics  . Smoking status: Former Research scientist (life sciences)  . Smokeless tobacco: Never Used     Comment: quit in 1980  . Alcohol use No  . Drug use: No  . Sexual activity: Not on file   Other Topics Concern  . Not on file   Social History Narrative  . No narrative on file    Past Surgical History:  Procedure Laterality Date  . ABDOMINAL HYSTERECTOMY    . APPENDECTOMY    . BREAST SURGERY     LUMPECTOMY- FIBROID TUMOR  . ENTEROCELE REPAIR    . EXCISIONAL HEMORRHOIDECTOMY      Family History  Problem Relation Age of Onset  . Colon cancer Mother 50  . Heart disease Father   . Colon cancer Brother 40  . Colon cancer Maternal Aunt 57  . Colon cancer Son 75  . Colon cancer Paternal Grandmother   . Colon cancer  Other 32    Allergies  Allergen Reactions  . Crab [Shellfish Allergy] Other (See Comments)    Severe stomach pains from crab    Current Outpatient Prescriptions on File Prior to Visit  Medication Sig Dispense Refill  . ALPRAZolam (XANAX) 0.5 MG tablet TAKE 1 TABLET THREE TIMES DAILY AS NEEDED  FOR  ANXIETY 90 tablet 1  . aspirin EC 81 MG tablet Take 81 mg by mouth daily.    Marland Kitchen CALCIUM-MAGNESIUM-VITAMIN D PO Take 1 tablet by mouth 2 (two) times daily.    . Cholecalciferol (VITAMIN D) 2000 UNITS tablet Take 2,000 Units by mouth daily.    Marland Kitchen conjugated estrogens (PREMARIN) vaginal cream Place 1 gram vaginally 1-2 times weekly 42.5 g 5  . estradiol (ESTRACE VAGINAL) 0.1 MG/GM vaginal cream Place [id number] Applicatorfuls vaginally once a week. 42.5 g 3  . levothyroxine (SYNTHROID, LEVOTHROID) 75 MCG tablet Take 1 tablet (75 mcg total) by mouth daily. 90 tablet 1  . MAGNESIUM PO Take 1 tablet by mouth daily.    Marland Kitchen OVER THE COUNTER MEDICATION Take 1 capsule by mouth at bedtime. OTC SLEEP AID     No current facility-administered medications on file prior to visit.     BP Marland Kitchen)  146/70 (BP Location: Right Arm, Patient Position: Sitting, Cuff Size: Normal)   Pulse 84   Temp 98.8 F (37.1 C) (Oral)   Ht 5\' 6"  (1.676 m)   Wt 144 lb 4 oz (65.4 kg)   SpO2 98%   BMI 23.28 kg/m     Review of Systems  Constitutional: Negative.   HENT: Negative for congestion, dental problem, hearing loss, rhinorrhea, sinus pressure, sore throat and tinnitus.   Eyes: Negative for pain, discharge and visual disturbance.  Respiratory: Negative for cough and shortness of breath.   Cardiovascular: Negative for chest pain, palpitations and leg swelling.  Gastrointestinal: Negative for abdominal distention, abdominal pain, blood in stool, constipation, diarrhea, nausea and vomiting.  Genitourinary: Positive for dysuria. Negative for difficulty urinating, flank pain, frequency, hematuria, pelvic pain, urgency, vaginal  bleeding, vaginal discharge and vaginal pain.  Musculoskeletal: Positive for back pain. Negative for arthralgias, gait problem and joint swelling.  Skin: Negative for rash.  Neurological: Negative for dizziness, syncope, speech difficulty, weakness, numbness and headaches.  Hematological: Negative for adenopathy.  Psychiatric/Behavioral: Negative for agitation, behavioral problems and dysphoric mood. The patient is not nervous/anxious.        Objective:   Physical Exam  Constitutional: She is oriented to person, place, and time. She appears well-developed and well-nourished.  Blood pressure 140/70  HENT:  Head: Normocephalic.  Right Ear: External ear normal.  Left Ear: External ear normal.  Mouth/Throat: Oropharynx is clear and moist.  Eyes: Conjunctivae and EOM are normal. Pupils are equal, round, and reactive to light.  Neck: Normal range of motion. Neck supple. No thyromegaly present.  Cardiovascular: Normal rate, regular rhythm, normal heart sounds and intact distal pulses.   Pulmonary/Chest: Effort normal and breath sounds normal.  Abdominal: Soft. Bowel sounds are normal. She exhibits no distension and no mass. There is no tenderness. There is no guarding.  No suprapubic tenderness  Musculoskeletal: Normal range of motion.  Lymphadenopathy:    She has no cervical adenopathy.  Neurological: She is alert and oriented to person, place, and time.  Skin: Skin is warm and dry. No rash noted.  Psychiatric: She has a normal mood and affect. Her behavior is normal.          Assessment & Plan:   History of dysuria resolved Hypothyroidism  Patient reassured Annual CPX as scheduled Will report any new or worsening symptoms  [name]

## 2016-08-13 NOTE — Patient Instructions (Signed)
Drink as much fluid as you  can tolerate over the next few days  Annual exam as scheduled

## 2016-08-13 NOTE — Progress Notes (Signed)
Pre visit review using our clinic review tool, if applicable. No additional management support is needed unless otherwise documented below in the visit note. 

## 2016-08-14 DIAGNOSIS — Z0100 Encounter for examination of eyes and vision without abnormal findings: Secondary | ICD-10-CM | POA: Diagnosis not present

## 2016-09-20 ENCOUNTER — Encounter: Payer: Self-pay | Admitting: Internal Medicine

## 2016-09-20 ENCOUNTER — Ambulatory Visit (INDEPENDENT_AMBULATORY_CARE_PROVIDER_SITE_OTHER): Payer: Medicare HMO | Admitting: Internal Medicine

## 2016-09-20 VITALS — BP 150/72 | HR 74 | Temp 98.5°F | Ht 66.0 in | Wt 143.0 lb

## 2016-09-20 DIAGNOSIS — J069 Acute upper respiratory infection, unspecified: Secondary | ICD-10-CM | POA: Diagnosis not present

## 2016-09-20 DIAGNOSIS — B9789 Other viral agents as the cause of diseases classified elsewhere: Secondary | ICD-10-CM | POA: Diagnosis not present

## 2016-09-20 MED ORDER — HYDROCODONE-HOMATROPINE 5-1.5 MG/5ML PO SYRP: ORAL_SOLUTION | ORAL | 0 refills | Status: DC

## 2016-09-20 NOTE — Patient Instructions (Addendum)
Acute bronchitis symptoms are generally not helped by antibiotics.  Take over-the-counter expectorants and cough medications such as  Mucinex DM.  Call if there is no improvement in 5 to 7 days or if  you develop worsening cough, fever, or new symptoms, such as shortness of breath or chest pain.  Hydrate and Humidify  Drink enough water to keep your urine clear or pale yellow. Staying hydrated will help to thin your mucus.  Use a cool mist humidifier to keep the humidity level in your home above 50%.  Inhale steam for 10-15 minutes, 3-4 times a day or as told by your health care provider. You can do this in the bathroom while a hot shower is running.  Limit your exposure to cool or dry air. Rest  Rest as much as possible.  Sleep with your head raised (elevated).  Make sure to get enough sleep each night.

## 2016-09-20 NOTE — Progress Notes (Signed)
Pre visit review using our clinic review tool, if applicable. No additional management support is needed unless otherwise documented below in the visit note. 

## 2016-09-20 NOTE — Progress Notes (Signed)
Subjective:    Patient ID: [name], female    DOB: 1941/02/05, 76 y.o.   MRN: [id number]  HPI  76 year old patient who has been ill intermittently for the past 6 weeks.  In early December she developed a URI with chest congestion and cough.  She improved but then had another relapse.  She improved the second time, but had the onset of chest congestion and productive cough.  3 days ago.  There is been no fever.  She has been using a number of over-the-counter products.  Denies any shortness of breath, wheezing or chest pain. Nonsmoker.  Past Medical History:  Diagnosis Date  . Hx of colonoscopy with polypectomy   . Hyperlipidemia   . Thyroid disease      Social History   Social History  . Marital status: Married    Spouse name: N/A  . Number of children: N/A  . Years of education: N/A   Occupational History  . Not on file.   Social History Main Topics  . Smoking status: Former Research scientist (life sciences)  . Smokeless tobacco: Never Used     Comment: quit in 1980  . Alcohol use No  . Drug use: No  . Sexual activity: Not on file   Other Topics Concern  . Not on file   Social History Narrative  . No narrative on file    Past Surgical History:  Procedure Laterality Date  . ABDOMINAL HYSTERECTOMY    . APPENDECTOMY    . BREAST SURGERY     LUMPECTOMY- FIBROID TUMOR  . ENTEROCELE REPAIR    . EXCISIONAL HEMORRHOIDECTOMY      Family History  Problem Relation Age of Onset  . Colon cancer Mother 83  . Heart disease Father   . Colon cancer Brother 42  . Colon cancer Maternal Aunt 88  . Colon cancer Son 57  . Colon cancer Paternal Grandmother   . Colon cancer Other 32    Allergies  Allergen Reactions  . Crab [Shellfish Allergy] Other (See Comments)    Severe stomach pains from crab    Current Outpatient Prescriptions on File Prior to Visit  Medication Sig Dispense Refill  . ALPRAZolam (XANAX) 0.5 MG tablet TAKE 1 TABLET THREE TIMES DAILY AS NEEDED  FOR  ANXIETY 90 tablet 1    . aspirin EC 81 MG tablet Take 81 mg by mouth daily.    Marland Kitchen CALCIUM-MAGNESIUM-VITAMIN D PO Take 1 tablet by mouth 2 (two) times daily.    . Cholecalciferol (VITAMIN D) 2000 UNITS tablet Take 2,000 Units by mouth daily.    Marland Kitchen conjugated estrogens (PREMARIN) vaginal cream Place 1 gram vaginally 1-2 times weekly 42.5 g 5  . estradiol (ESTRACE VAGINAL) 0.1 MG/GM vaginal cream Place [id number] Applicatorfuls vaginally once a week. 42.5 g 3  . levothyroxine (SYNTHROID, LEVOTHROID) 75 MCG tablet Take 1 tablet (75 mcg total) by mouth daily. 90 tablet 1  . MAGNESIUM PO Take 1 tablet by mouth daily.    Marland Kitchen OVER THE COUNTER MEDICATION Take 1 capsule by mouth at bedtime. OTC SLEEP AID     No current facility-administered medications on file prior to visit.     BP (!) 150/72 (BP Location: Right Arm, Patient Position: Sitting, Cuff Size: Normal)   Pulse 74   Temp 98.5 F (36.9 C) (Oral)   Ht 5\' 6"  (1.676 m)   Wt 143 lb (64.9 kg)   SpO2 94%   BMI 23.08 kg/m     Review of Systems  Constitutional: Positive for activity change and fatigue. Negative for fever.  HENT: Negative for congestion, dental problem, hearing loss, rhinorrhea, sinus pressure, sore throat and tinnitus.   Eyes: Negative for pain, discharge and visual disturbance.  Respiratory: Positive for cough. Negative for shortness of breath.   Cardiovascular: Negative for chest pain, palpitations and leg swelling.  Gastrointestinal: Negative for abdominal distention, abdominal pain, blood in stool, constipation, diarrhea, nausea and vomiting.  Genitourinary: Negative for difficulty urinating, dysuria, flank pain, frequency, hematuria, pelvic pain, urgency, vaginal bleeding, vaginal discharge and vaginal pain.  Musculoskeletal: Negative for arthralgias, gait problem and joint swelling.  Skin: Negative for rash.  Neurological: Negative for dizziness, syncope, speech difficulty, weakness, numbness and headaches.  Hematological: Negative for adenopathy.   Psychiatric/Behavioral: Negative for agitation, behavioral problems and dysphoric mood. The patient is not nervous/anxious.        Objective:   Physical Exam  Constitutional: She is oriented to person, place, and time. She appears well-developed and well-nourished. No distress.  Afebrile Appears well  HENT:  Head: Normocephalic.  Right Ear: External ear normal.  Left Ear: External ear normal.  Mouth/Throat: Oropharynx is clear and moist.  Eyes: Conjunctivae and EOM are normal. Pupils are equal, round, and reactive to light.  Neck: Normal range of motion. Neck supple. No thyromegaly present.  Cardiovascular: Normal rate, regular rhythm, normal heart sounds and intact distal pulses.   Pulmonary/Chest: Effort normal and breath sounds normal. No respiratory distress. She has no wheezes. She has no rales.  Abdominal: Soft. Bowel sounds are normal. She exhibits no mass. There is no tenderness.  Musculoskeletal: Normal range of motion.  Lymphadenopathy:    She has no cervical adenopathy.  Neurological: She is alert and oriented to person, place, and time.  Skin: Skin is warm and dry. No rash noted.  Psychiatric: She has a normal mood and affect. Her behavior is normal.          Assessment & Plan:   Viral URI with cough.  Will continue symptomatic treatment.  We'll hydrate, rest Hypothyroidism  [name]

## 2016-10-14 ENCOUNTER — Other Ambulatory Visit: Payer: Self-pay | Admitting: Internal Medicine

## 2016-11-11 ENCOUNTER — Other Ambulatory Visit: Payer: Self-pay | Admitting: Internal Medicine

## 2016-11-18 ENCOUNTER — Telehealth: Payer: Self-pay | Admitting: Internal Medicine

## 2016-11-18 ENCOUNTER — Other Ambulatory Visit: Payer: Self-pay | Admitting: Adult Health

## 2016-11-18 NOTE — Telephone Encounter (Signed)
Pt need new Rx alprazolam   Pharm:  [facility]

## 2016-11-19 NOTE — Telephone Encounter (Signed)
Spoke with pharmacy tech from [place] and she stated that this medication was refilled on 11/12/16 and was mailed out this AM. Pt was called and informed that her medication is on the way to her home. Pt verbalized understanding.

## 2016-11-19 NOTE — Telephone Encounter (Signed)
Dr K patient

## 2017-01-15 ENCOUNTER — Encounter: Payer: Self-pay | Admitting: Gynecology

## 2017-01-22 ENCOUNTER — Encounter: Payer: Self-pay | Admitting: Internal Medicine

## 2017-01-22 ENCOUNTER — Ambulatory Visit (INDEPENDENT_AMBULATORY_CARE_PROVIDER_SITE_OTHER): Payer: Medicare HMO | Admitting: Internal Medicine

## 2017-01-22 VITALS — BP 126/82 | HR 70 | Temp 97.9°F | Wt 141.6 lb

## 2017-01-22 DIAGNOSIS — E039 Hypothyroidism, unspecified: Secondary | ICD-10-CM

## 2017-01-22 DIAGNOSIS — N952 Postmenopausal atrophic vaginitis: Secondary | ICD-10-CM

## 2017-01-22 DIAGNOSIS — R300 Dysuria: Secondary | ICD-10-CM

## 2017-01-22 LAB — POC URINALSYSI DIPSTICK (AUTOMATED)
Bilirubin, UA: NEGATIVE
Glucose, UA: NEGATIVE
Ketones, UA: NEGATIVE
Protein, UA: NEGATIVE
Spec Grav, UA: 1.01
Urobilinogen, UA: 0.2 U/dL
pH, UA: 6.5

## 2017-01-22 LAB — TSH: TSH: 0.4 u[IU]/mL (ref 0.35–4.50)

## 2017-01-22 MED ORDER — ALPRAZOLAM 0.5 MG PO TABS: ORAL_TABLET | ORAL | 0 refills | Status: DC

## 2017-01-22 NOTE — Progress Notes (Signed)
Patient Rx for ALPRAZolam 0.5 mg was phoned in to [facility], #[phone] with no refills.

## 2017-01-22 NOTE — Patient Instructions (Addendum)
WE NOW OFFER   [facility] FAST TRACK!!!  SAME DAY Appointments for ACUTE CARE  Such as: Sprains, Injuries, cuts, abrasions, rashes, muscle pain, joint pain, back pain Colds, flu, sore throats, headache, allergies, cough, fever  Ear pain, sinus and eye infections Abdominal pain, nausea, vomiting, diarrhea, upset stomach Animal/insect bites  3 Easy Ways to Schedule: Walk-In Scheduling Call in scheduling Mychart Sign-up: [email or web address]   Moderate your  fluid and caffeine intake  User vaginal cream daily for 4 weeks and then increase to 3 times per week

## 2017-01-23 ENCOUNTER — Encounter: Payer: Self-pay | Admitting: Internal Medicine

## 2017-01-23 NOTE — Progress Notes (Signed)
Subjective:    Patient ID: [name], female    DOB: 1941-08-29, 76 y.o.   MRN: [id number]  HPI  76 year old patient who presents with a chief complaint of intermittent dysuria over the past 2 months.  She describes a "burning urethral"discomfort.  She also complains of strong urine.  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 Denies any nocturia, although she does have some urinary frequency throughout the day She has been evaluated by gynecology for ovarian cyst that apparently has resolved. No fever or other constitutional complaints  Past Medical History:  Diagnosis Date  . Hx of colonoscopy with polypectomy   . Hyperlipidemia   . Thyroid disease      Social History   Social History  . Marital status: Married    Spouse name: N/A  . Number of children: N/A  . Years of education: N/A   Occupational History  . Not on file.   Social History Main Topics  . Smoking status: Former Research scientist (life sciences)  . Smokeless tobacco: Never Used     Comment: quit in 1980  . Alcohol use No  . Drug use: No  . Sexual activity: Not on file   Other Topics Concern  . Not on file   Social History Narrative  . No narrative on file    Past Surgical History:  Procedure Laterality Date  . ABDOMINAL HYSTERECTOMY    . APPENDECTOMY    . BREAST SURGERY     LUMPECTOMY- FIBROID TUMOR  . ENTEROCELE REPAIR    . EXCISIONAL HEMORRHOIDECTOMY      Family History  Problem Relation Age of Onset  . Colon cancer Mother 15  . Heart disease Father   . Colon cancer Brother 13  . Colon cancer Maternal Aunt 44  . Colon cancer Son 2  . Colon cancer Paternal Grandmother   . Colon cancer Other 32    Allergies  Allergen Reactions  . Crab [Shellfish Allergy] Other (See Comments)    Severe stomach pains from crab    Current Outpatient Prescriptions on File Prior to Visit  Medication Sig Dispense Refill  . aspirin EC 81 MG tablet Take 81 mg by mouth  daily.    Marland Kitchen CALCIUM-MAGNESIUM-VITAMIN D PO Take 1 tablet by mouth 2 (two) times daily.    . Cholecalciferol (VITAMIN D) 2000 UNITS tablet Take 2,000 Units by mouth daily.    Marland Kitchen conjugated estrogens (PREMARIN) vaginal cream Place 1 gram vaginally 1-2 times weekly 42.5 g 5  . levothyroxine (SYNTHROID, LEVOTHROID) 75 MCG tablet TAKE 1 TABLET (75 MCG TOTAL) BY MOUTH DAILY. 90 tablet 1  . MAGNESIUM PO Take 1 tablet by mouth daily.    Marland Kitchen OVER THE COUNTER MEDICATION Take 1 capsule by mouth at bedtime. OTC SLEEP AID     No current facility-administered medications on file prior to visit.     BP 126/82 (BP Location: Left Arm, Patient Position: Sitting, Cuff Size: Normal)   Pulse 70   Temp 97.9 F (36.6 C) (Oral)   Wt 141 lb 9.6 oz (64.2 kg)   SpO2 97%   BMI 22.85 kg/m     Review of Systems  Constitutional: Negative.   HENT: Negative for congestion, dental problem, hearing loss, rhinorrhea, sinus pressure, sore throat and tinnitus.   Eyes: Negative for pain, discharge and visual disturbance.  Respiratory: Negative for cough and shortness of breath.   Cardiovascular: Negative for chest pain, palpitations and leg swelling.  Gastrointestinal: Negative for abdominal distention, abdominal pain, blood in stool, constipation, diarrhea, nausea and vomiting.  Genitourinary: Positive for dysuria and frequency. Negative for difficulty urinating, flank pain, hematuria, pelvic pain, vaginal bleeding, vaginal discharge and vaginal pain.  Musculoskeletal: Negative for arthralgias, gait problem and joint swelling.  Skin: Negative for rash.  Neurological: Negative for dizziness, syncope, speech difficulty, weakness, numbness and headaches.  Hematological: Negative for adenopathy.  Psychiatric/Behavioral: Negative for agitation, behavioral problems and dysphoric mood. The patient is not nervous/anxious.        Objective:   Physical Exam  Constitutional: She appears well-developed and well-nourished. No  distress.  Cardiovascular: Regular rhythm.   Pulmonary/Chest: Effort normal and breath sounds normal. No respiratory distress.  Abdominal: Soft. Bowel sounds are normal. There is no tenderness.          Assessment & Plan:    Intermittent dysuria.  Urinalysis nonspecific.  Suspect symptoms related to urogenital atrophy.  Will increase Premarin vaginal cream daily for one week and then increase to 3 times weekly regimen Patient report any clinical worsening  [name]

## 2017-03-12 ENCOUNTER — Other Ambulatory Visit: Payer: Self-pay | Admitting: Internal Medicine

## 2017-04-03 ENCOUNTER — Other Ambulatory Visit: Payer: Self-pay | Admitting: Internal Medicine

## 2017-04-18 ENCOUNTER — Telehealth: Payer: Self-pay | Admitting: Internal Medicine

## 2017-04-18 NOTE — Telephone Encounter (Signed)
Pt would like blood work result  °

## 2017-04-18 NOTE — Telephone Encounter (Signed)
Please call/notify patient that lab/test/procedure is normal  Patient's last blood work was a thyroid test in August which was normal.  Is this the lab she was referring to

## 2017-04-18 NOTE — Telephone Encounter (Signed)
Please advise 

## 2017-04-21 NOTE — Telephone Encounter (Signed)
Left message on voicemail to call office.  

## 2017-05-09 DIAGNOSIS — L820 Inflamed seborrheic keratosis: Secondary | ICD-10-CM | POA: Diagnosis not present

## 2017-05-09 DIAGNOSIS — L821 Other seborrheic keratosis: Secondary | ICD-10-CM | POA: Diagnosis not present

## 2017-05-09 DIAGNOSIS — L603 Nail dystrophy: Secondary | ICD-10-CM | POA: Diagnosis not present

## 2017-05-09 DIAGNOSIS — Z1283 Encounter for screening for malignant neoplasm of skin: Secondary | ICD-10-CM | POA: Diagnosis not present

## 2017-05-14 DIAGNOSIS — Z6823 Body mass index (BMI) 23.0-23.9, adult: Secondary | ICD-10-CM | POA: Diagnosis not present

## 2017-05-14 DIAGNOSIS — Z01419 Encounter for gynecological examination (general) (routine) without abnormal findings: Secondary | ICD-10-CM | POA: Diagnosis not present

## 2017-05-14 DIAGNOSIS — Z1231 Encounter for screening mammogram for malignant neoplasm of breast: Secondary | ICD-10-CM | POA: Diagnosis not present

## 2017-06-17 ENCOUNTER — Ambulatory Visit (INDEPENDENT_AMBULATORY_CARE_PROVIDER_SITE_OTHER): Payer: Medicare HMO | Admitting: *Deleted

## 2017-06-17 DIAGNOSIS — Z23 Encounter for immunization: Secondary | ICD-10-CM | POA: Diagnosis not present

## 2017-08-20 ENCOUNTER — Other Ambulatory Visit: Payer: Self-pay | Admitting: Internal Medicine

## 2017-10-16 ENCOUNTER — Other Ambulatory Visit: Payer: Self-pay | Admitting: Internal Medicine

## 2017-11-11 ENCOUNTER — Telehealth: Payer: Self-pay | Admitting: *Deleted

## 2017-11-11 DIAGNOSIS — L84 Corns and callosities: Secondary | ICD-10-CM

## 2017-11-11 NOTE — Telephone Encounter (Signed)
Okay for podiatry referral

## 2017-11-11 NOTE — Telephone Encounter (Signed)
Copied from [place] [id number]. Topic: Referral - Question >> Nov 11, 2017 12:11 PM Hewitt Shorts wrote: CRM for notification. See Telephone encounter for: pt has a knot on the bottom of his foot and is wanting to know if she needs to be seen in office or can a referral to a podiatrist be put in   Best number [phone]    11/11/17.

## 2017-11-12 NOTE — Telephone Encounter (Addendum)
Referral placed as directed. Pt notified of results/instructions and verbalized understanding.

## 2017-11-18 DIAGNOSIS — H5203 Hypermetropia, bilateral: Secondary | ICD-10-CM | POA: Diagnosis not present

## 2017-11-21 DIAGNOSIS — H5203 Hypermetropia, bilateral: Secondary | ICD-10-CM | POA: Diagnosis not present

## 2017-11-21 DIAGNOSIS — H524 Presbyopia: Secondary | ICD-10-CM | POA: Diagnosis not present

## 2017-11-21 DIAGNOSIS — H52209 Unspecified astigmatism, unspecified eye: Secondary | ICD-10-CM | POA: Diagnosis not present

## 2017-12-01 ENCOUNTER — Ambulatory Visit (INDEPENDENT_AMBULATORY_CARE_PROVIDER_SITE_OTHER): Payer: Medicare HMO

## 2017-12-01 ENCOUNTER — Encounter: Payer: Self-pay | Admitting: Podiatry

## 2017-12-01 ENCOUNTER — Ambulatory Visit: Payer: Medicare HMO | Admitting: Podiatry

## 2017-12-01 DIAGNOSIS — M7751 Other enthesopathy of right foot: Secondary | ICD-10-CM

## 2017-12-01 DIAGNOSIS — M79673 Pain in unspecified foot: Secondary | ICD-10-CM

## 2017-12-01 DIAGNOSIS — M7750 Other enthesopathy of unspecified foot: Secondary | ICD-10-CM | POA: Diagnosis not present

## 2017-12-01 DIAGNOSIS — M21619 Bunion of unspecified foot: Secondary | ICD-10-CM

## 2017-12-01 DIAGNOSIS — M779 Enthesopathy, unspecified: Secondary | ICD-10-CM

## 2017-12-01 MED ORDER — TRIAMCINOLONE ACETONIDE 10 MG/ML IJ SUSP
10.0000 mg | Freq: Once | INTRAMUSCULAR | Status: AC
  Administered 2017-12-01: 10 mg

## 2017-12-01 NOTE — Progress Notes (Signed)
Subjective:   Patient ID: [name], female   DOB: 77 y.o.   MRN: [id number]   HPI Patient presents with bunion deformity right over left and exquisite inflammation of the right forefoot for several months duration.  Patient does not remember specific injury and states it is gradually gotten worse and also at this time does not smoke and likes to be active   Review of Systems  All other systems reviewed and are negative.       Objective:  Physical Exam  Constitutional: She appears well-developed and well-nourished.  Cardiovascular: Intact distal pulses.  Pulmonary/Chest: Effort normal.  Musculoskeletal: Normal range of motion.  Neurological: She is alert.  Skin: Skin is warm.  Nursing note and vitals reviewed.   Neurovascular status intact muscle strength is adequate range of motion within normal limits with patient found to have exquisite discomfort in the second metatarsal phalangeal joint with inflammation fluid buildup around the joint surface and is noted to have moderate structural bunion deformity with deviation of the hallux right over left foot.  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  Do not recommend surgical correction at this time but I did go ahead for the right and I did a proximal nerve block and I then aspirated the joint getting out a small amount of clear fluid and injected with half cc dexamethasone Kenalog and applied padding to reduce pressure on the joint surface.  Patient will be seen back to recheck again in the next several weeks  X-rays indicate structural bunion deformity bilateral hallux interphalangeus deformity bilateral and no indication of stress fracture arthritis

## 2017-12-01 NOTE — Patient Instructions (Signed)

## 2017-12-22 ENCOUNTER — Ambulatory Visit (INDEPENDENT_AMBULATORY_CARE_PROVIDER_SITE_OTHER): Payer: Medicare HMO | Admitting: Podiatry

## 2017-12-22 ENCOUNTER — Encounter: Payer: Self-pay | Admitting: Podiatry

## 2017-12-22 DIAGNOSIS — M21619 Bunion of unspecified foot: Secondary | ICD-10-CM | POA: Diagnosis not present

## 2017-12-22 DIAGNOSIS — M2041 Other hammer toe(s) (acquired), right foot: Secondary | ICD-10-CM | POA: Diagnosis not present

## 2017-12-22 DIAGNOSIS — M779 Enthesopathy, unspecified: Secondary | ICD-10-CM | POA: Diagnosis not present

## 2017-12-23 NOTE — Progress Notes (Signed)
Subjective:   Patient ID: [name], female   DOB: 77 y.o.   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

## 2018-02-13 ENCOUNTER — Other Ambulatory Visit: Payer: Self-pay | Admitting: Internal Medicine

## 2018-02-17 NOTE — Telephone Encounter (Signed)
Pt notified. Appointment scheduled for refills.

## 2018-02-17 NOTE — Telephone Encounter (Signed)
Pt needs an ov for more refills. Will notify pt.

## 2018-02-23 ENCOUNTER — Telehealth: Payer: Self-pay | Admitting: Internal Medicine

## 2018-02-23 ENCOUNTER — Encounter: Payer: Self-pay | Admitting: Internal Medicine

## 2018-02-23 ENCOUNTER — Ambulatory Visit (INDEPENDENT_AMBULATORY_CARE_PROVIDER_SITE_OTHER): Payer: Medicare HMO | Admitting: Internal Medicine

## 2018-02-23 VITALS — BP 170/70 | HR 68 | Temp 98.8°F | Wt 138.0 lb

## 2018-02-23 DIAGNOSIS — E039 Hypothyroidism, unspecified: Secondary | ICD-10-CM

## 2018-02-23 DIAGNOSIS — E7800 Pure hypercholesterolemia, unspecified: Secondary | ICD-10-CM | POA: Diagnosis not present

## 2018-02-23 DIAGNOSIS — Z86010 Personal history of colonic polyps: Secondary | ICD-10-CM

## 2018-02-23 MED ORDER — LEVOTHYROXINE SODIUM 75 MCG PO TABS: ORAL_TABLET | ORAL | 3 refills | Status: DC

## 2018-02-23 MED ORDER — ALPRAZOLAM 0.5 MG PO TABS: 0.5000 mg | ORAL_TABLET | Freq: Three times a day (TID) | ORAL | 1 refills | Status: DC | PRN

## 2018-02-23 NOTE — Telephone Encounter (Signed)
Copied from [place] [id number]. Topic: Quick Communication - Rx Refill/Question >> Feb 23, 2018  5:16 PM Lysle Morales L, NT wrote: Medication:  ALPRAZolam (XANAX) 0.5 MG tablet and also  levothyroxine (SYNTHROID, LEVOTHROID) 75 MCG tablet   Has the patient contacted their pharmacy? yes (Agent: If no, request that the patient contact the pharmacy for the refill. (Agent: If yes, when and what did the pharmacy advise?  Preferred Pharmacy (with phone number or street name): [place], [place] [phone] (Phone) [phone] (Fax)  Patient said she wanted the refills sent to [facility] instead of [place] and  she cancelled them at [facility]  ,please resend  To the above pharmacy    Agent: Please be advised that RX refills may take up to 3 business days. We ask that you follow-up with your pharmacy.

## 2018-02-23 NOTE — Patient Instructions (Addendum)
Limit your sodium (Salt) intake  Please check your blood pressure on a regular basis.  If it is consistently greater than 150/90, please make an office appointment.  Please obtain a home blood pressure monitor to follow home blood pressure readings.  Bring this home blood pressure monitor to your next office visit   Ethel stands for "Dietary Approaches to Stop Hypertension." The DASH eating plan is a healthy eating plan that has been shown to reduce high blood pressure (hypertension). It may also reduce your risk for type 2 diabetes, heart disease, and stroke. The DASH eating plan may also help with weight loss. What are tips for following this plan? General guidelines  Avoid eating more than 2,300 mg (milligrams) of salt (sodium) a day. If you have hypertension, you may need to reduce your sodium intake to 1,500 mg a day.  Limit alcohol intake to no more than 1 drink a day for nonpregnant women and 2 drinks a day for men. One drink equals 12 oz of beer, 5 oz of wine, or 1 oz of hard liquor.  Work with your health care provider to maintain a healthy body weight or to lose weight. Ask what an ideal weight is for you.  Get at least 30 minutes of exercise that causes your heart to beat faster (aerobic exercise) most days of the week. Activities may include walking, swimming, or biking.  Work with your health care provider or diet and nutrition specialist (dietitian) to adjust your eating plan to your individual calorie needs. Reading food labels  Check food labels for the amount of sodium per serving. Choose foods with less than 5 percent of the Daily Value of sodium. Generally, foods with less than 300 mg of sodium per serving fit into this eating plan.  To find whole grains, look for the word "whole" as the first word in the ingredient list. Shopping  Buy products labeled as "low-sodium" or "no salt added."  Buy fresh foods. Avoid canned foods and premade or frozen  meals. Cooking  Avoid adding salt when cooking. Use salt-free seasonings or herbs instead of table salt or sea salt. Check with your health care provider or pharmacist before using salt substitutes.  Do not fry foods. Cook foods using healthy methods such as baking, boiling, grilling, and broiling instead.  Cook with heart-healthy oils, such as olive, canola, soybean, or sunflower oil. Meal planning   Eat a balanced diet that includes: ? 5 or more servings of fruits and vegetables each day. At each meal, try to fill half of your plate with fruits and vegetables. ? Up to 6-8 servings of whole grains each day. ? Less than 6 oz of lean meat, poultry, or fish each day. A 3-oz serving of meat is about the same size as a deck of cards. One egg equals 1 oz. ? 2 servings of low-fat dairy each day. ? A serving of nuts, seeds, or beans 5 times each week. ? Heart-healthy fats. Healthy fats called Omega-3 fatty acids are found in foods such as flaxseeds and coldwater fish, like sardines, salmon, and mackerel.  Limit how much you eat of the following: ? Canned or prepackaged foods. ? Food that is high in trans fat, such as fried foods. ? Food that is high in saturated fat, such as fatty meat. ? Sweets, desserts, sugary drinks, and other foods with added sugar. ? Full-fat dairy products.  Do not salt foods before eating.  Try to eat at least  2 vegetarian meals each week.  Eat more home-cooked food and less restaurant, buffet, and fast food.  When eating at a restaurant, ask that your food be prepared with less salt or no salt, if possible. What foods are recommended? The items listed may not be a complete list. Talk with your dietitian about what dietary choices are best for you. Grains Whole-grain or whole-wheat bread. Whole-grain or whole-wheat pasta. Brown rice. [place]. Bulgur. Whole-grain and low-sodium cereals. Pita bread. Low-fat, low-sodium crackers. Whole-wheat flour  tortillas. Vegetables Fresh or frozen vegetables (raw, steamed, roasted, or grilled). Low-sodium or reduced-sodium tomato and vegetable juice. Low-sodium or reduced-sodium tomato sauce and tomato paste. Low-sodium or reduced-sodium canned vegetables. Fruits All fresh, dried, or frozen fruit. Canned fruit in natural juice (without added sugar). Meat and other protein foods Skinless chicken or [place]. Ground chicken or [place]. Pork with fat trimmed off. Fish and seafood. Egg whites. Dried beans, peas, or lentils. Unsalted nuts, nut butters, and seeds. Unsalted canned beans. Lean cuts of beef with fat trimmed off. Low-sodium, lean deli meat. Dairy Low-fat (1%) or fat-free (skim) milk. Fat-free, low-fat, or reduced-fat cheeses. Nonfat, low-sodium ricotta or cottage cheese. Low-fat or nonfat yogurt. Low-fat, low-sodium cheese. Fats and oils Soft margarine without trans fats. Vegetable oil. Low-fat, reduced-fat, or light mayonnaise and salad dressings (reduced-sodium). Canola, safflower, olive, soybean, and sunflower oils. Avocado. Seasoning and other foods Herbs. Spices. Seasoning mixes without salt. Unsalted popcorn and pretzels. Fat-free sweets. What foods are not recommended? The items listed may not be a complete list. Talk with your dietitian about what dietary choices are best for you. Grains Baked goods made with fat, such as croissants, muffins, or some breads. Dry pasta or rice meal packs. Vegetables Creamed or fried vegetables. Vegetables in a cheese sauce. Regular canned vegetables (not low-sodium or reduced-sodium). Regular canned tomato sauce and paste (not low-sodium or reduced-sodium). Regular tomato and vegetable juice (not low-sodium or reduced-sodium). Angie Fava. Olives. Fruits Canned fruit in a light or heavy syrup. Fried fruit. Fruit in cream or butter sauce. Meat and other protein foods Fatty cuts of meat. Ribs. Fried meat. Berniece Salines. Sausage. Bologna and other processed lunch meats.  Salami. Fatback. Hotdogs. Bratwurst. Salted nuts and seeds. Canned beans with added salt. Canned or smoked fish. Whole eggs or egg yolks. Chicken or [place] with skin. Dairy Whole or 2% milk, cream, and half-and-half. Whole or full-fat cream cheese. Whole-fat or sweetened yogurt. Full-fat cheese. Nondairy creamers. Whipped toppings. Processed cheese and cheese spreads. Fats and oils Butter. Stick margarine. Lard. Shortening. Ghee. Bacon fat. Tropical oils, such as coconut, palm kernel, or palm oil. Seasoning and other foods Salted popcorn and pretzels. Onion salt, garlic salt, seasoned salt, table salt, and sea salt. Worcestershire sauce. Tartar sauce. Barbecue sauce. Teriyaki sauce. Soy sauce, including reduced-sodium. Steak sauce. Canned and packaged gravies. Fish sauce. Oyster sauce. Cocktail sauce. Horseradish that you find on the shelf. Ketchup. Mustard. Meat flavorings and tenderizers. Bouillon cubes. Hot sauce and Tabasco sauce. Premade or packaged marinades. Premade or packaged taco seasonings. Relishes. Regular salad dressings. Where to find more information:  National Heart, Lung, and [place]: https://wilson-eaton.com/  American Heart Association: www.heart.org Summary  The DASH eating plan is a healthy eating plan that has been shown to reduce high blood pressure (hypertension). It may also reduce your risk for type 2 diabetes, heart disease, and stroke.  With the DASH eating plan, you should limit salt (sodium) intake to 2,300 mg a day. If you have hypertension, you may  need to reduce your sodium intake to 1,500 mg a day.  When on the DASH eating plan, aim to eat more fresh fruits and vegetables, whole grains, lean proteins, low-fat dairy, and heart-healthy fats.  Work with your health care provider or diet and nutrition specialist (dietitian) to adjust your eating plan to your individual calorie needs. This information is not intended to replace advice given to you by your health  care provider. Make sure you discuss any questions you have with your health care provider. Document Released: 08/08/2011 Document Revised: 08/12/2016 Document Reviewed: 08/12/2016 Elsevier Interactive Patient Education  [employer].

## 2018-02-23 NOTE — Progress Notes (Signed)
Subjective:    Patient ID: [name], female    DOB: 01-17-1941, 77 y.o.   MRN: [id number]  HPI   BP Readings from Last 3 Encounters:  02/23/18 (!) 170/70  01/22/17 126/82  09/20/16 (!) 32/68   77 year old patient who is seen today for follow-up.  She has not been seen in some time.  Medical issues include anxiety hypothyroidism and history of dyslipidemia.  She also has a history of colonic polyps Doing quite well today does take daily alprazolam 1 mg daily and for divided dosages.  Remains on levothyroxine supplementation.  She complains of increasing situational stress due to the poor health of several family members.  Past Medical History:  Diagnosis Date  . Hx of colonoscopy with polypectomy   . Hyperlipidemia   . Thyroid disease      Social History   Socioeconomic History  . Marital status: Married    Spouse name: Not on file  . Number of children: Not on file  . Years of education: Not on file  . Highest education level: Not on file  Occupational History  . Not on file  Social Needs  . Financial resource strain: Not on file  . Food insecurity:    Worry: Not on file    Inability: Not on file  . Transportation needs:    Medical: Not on file    Non-medical: Not on file  Tobacco Use  . Smoking status: Former Research scientist (life sciences)  . Smokeless tobacco: Never Used  . Tobacco comment: quit in 1980  Substance and Sexual Activity  . Alcohol use: No    Alcohol/week: 0.0 oz  . Drug use: No  . Sexual activity: Not on file  Lifestyle  . Physical activity:    Days per week: Not on file    Minutes per session: Not on file  . Stress: Not on file  Relationships  . Social connections:    Talks on phone: Not on file    Gets together: Not on file    Attends religious service: Not on file    Active member of club or organization: Not on file    Attends meetings of clubs or organizations: Not on file    Relationship status: Not on file  . Intimate partner violence:    Fear  of current or ex partner: Not on file    Emotionally abused: Not on file    Physically abused: Not on file    Forced sexual activity: Not on file  Other Topics Concern  . Not on file  Social History Narrative  . Not on file    Past Surgical History:  Procedure Laterality Date  . ABDOMINAL HYSTERECTOMY    . APPENDECTOMY    . BREAST SURGERY     LUMPECTOMY- FIBROID TUMOR  . ENTEROCELE REPAIR    . EXCISIONAL HEMORRHOIDECTOMY      Family History  Problem Relation Age of Onset  . Colon cancer Mother 38  . Heart disease Father   . Colon cancer Brother 41  . Colon cancer Maternal Aunt 13  . Colon cancer Son 63  . Colon cancer Paternal Grandmother   . Colon cancer Other 32    Allergies  Allergen Reactions  . Crab [Shellfish Allergy] Other (See Comments)    Severe stomach pains from crab    Current Outpatient Medications on File Prior to Visit  Medication Sig Dispense Refill  . CALCIUM-MAGNESIUM-VITAMIN D PO Take 1 tablet by mouth 2 (two) times daily.    Marland Kitchen  Cholecalciferol (VITAMIN D) 2000 UNITS tablet Take 2,000 Units by mouth daily.    Marland Kitchen conjugated estrogens (PREMARIN) vaginal cream Place 1 gram vaginally 1-2 times weekly 42.5 g 5  . MAGNESIUM PO Take 1 tablet by mouth daily.    Marland Kitchen OVER THE COUNTER MEDICATION Take 1 capsule by mouth at bedtime. OTC SLEEP AID     No current facility-administered medications on file prior to visit.     BP (!) 170/70 (BP Location: Right Arm, Patient Position: Sitting, Cuff Size: Normal)   Pulse 68   Temp 98.8 F (37.1 C) (Oral)   Wt 138 lb (62.6 kg)   SpO2 97%   BMI 22.27 kg/m     Review of Systems  Constitutional: Negative.   HENT: Negative for congestion, dental problem, hearing loss, rhinorrhea, sinus pressure, sore throat and tinnitus.   Eyes: Negative for pain, discharge and visual disturbance.  Respiratory: Negative for cough and shortness of breath.   Cardiovascular: Negative for chest pain, palpitations and leg swelling.    Gastrointestinal: Negative for abdominal distention, abdominal pain, blood in stool, constipation, diarrhea, nausea and vomiting.  Genitourinary: Negative for difficulty urinating, dysuria, flank pain, frequency, hematuria, pelvic pain, urgency, vaginal bleeding, vaginal discharge and vaginal pain.  Musculoskeletal: Negative for arthralgias, gait problem and joint swelling.  Skin: Negative for rash.  Neurological: Negative for dizziness, syncope, speech difficulty, weakness, numbness and headaches.  Hematological: Negative for adenopathy.  Psychiatric/Behavioral: Negative for agitation, behavioral problems and dysphoric mood. The patient is nervous/anxious.        Objective:   Physical Exam  Constitutional: She is oriented to person, place, and time. She appears well-developed and well-nourished.  Blood pressure 150/82  HENT:  Head: Normocephalic.  Right Ear: External ear normal.  Left Ear: External ear normal.  Mouth/Throat: Oropharynx is clear and moist.  Eyes: Pupils are equal, round, and reactive to light. Conjunctivae and EOM are normal.  Neck: Normal range of motion. Neck supple. No thyromegaly present.  Cardiovascular: Normal rate, regular rhythm, normal heart sounds and intact distal pulses.  Pulmonary/Chest: Effort normal and breath sounds normal.  Abdominal: Soft. Bowel sounds are normal. She exhibits no mass. There is no tenderness.  Musculoskeletal: Normal range of motion.  Lymphadenopathy:    She has no cervical adenopathy.  Neurological: She is alert and oriented to person, place, and time.  Skin: Skin is warm and dry. No rash noted.  Psychiatric: She has a normal mood and affect. Her behavior is normal.          Assessment & Plan:   Rule out sustained hypertension.  Will place on a DASH diet.  Follow-up 4 to 6 weeks Hypothyroidism.  Will check a TSH Preventive health.  Will check updated lab Anxiety disorder.  Alprazolam refilled  Follow-up 4 to 6  weeks Reviewed lab Medications updated  Marletta Lor

## 2018-02-24 LAB — COMPREHENSIVE METABOLIC PANEL
ALBUMIN: 4.4 g/dL (ref 3.5–5.2)
ALK PHOS: 69 U/L (ref 39–117)
ALT: 13 U/L (ref 0–35)
AST: 15 U/L (ref 0–37)
BUN: 13 mg/dL (ref 6–23)
CALCIUM: 9.6 mg/dL (ref 8.4–10.5)
CHLORIDE: 102 meq/L (ref 96–112)
CO2: 28 mEq/L (ref 19–32)
CREATININE: 0.87 mg/dL (ref 0.40–1.20)
GFR: 66.85 mL/min (ref >60.00)
Glucose, Bld: 92 mg/dL (ref 70–99)
POTASSIUM: 4 meq/L (ref 3.5–5.1)
Sodium: 138 mEq/L (ref 135–145)
TOTAL PROTEIN: 6.9 g/dL (ref 6.0–8.3)
Total Bilirubin: 0.4 mg/dL (ref 0.2–1.2)

## 2018-02-24 LAB — LIPID PANEL
Cholesterol: 242 mg/dL — ABNORMAL HIGH (ref 0–200)
HDL: 52.6 mg/dL (ref >39.00)
NonHDL: 189.31
TRIGLYCERIDES: 243 mg/dL — AB (ref 0.0–149.0)
Total CHOL/HDL Ratio: 5
VLDL: 48.6 mg/dL — ABNORMAL HIGH (ref 0.0–40.0)

## 2018-02-24 LAB — CBC WITH DIFFERENTIAL/PLATELET
BASOS ABS: 0 10*3/uL (ref 0.0–0.1)
BASOS PCT: 0.5 % (ref 0.0–3.0)
Eosinophils Absolute: 0.1 10*3/uL (ref 0.0–0.7)
Eosinophils Relative: 2.1 % (ref 0.0–5.0)
HCT: 42.4 % (ref 36.0–46.0)
Hemoglobin: 14.5 g/dL (ref 12.0–15.0)
LYMPHS ABS: 1.5 10*3/uL (ref 0.7–4.0)
LYMPHS PCT: 25.9 % (ref 12.0–46.0)
MCHC: 34.1 g/dL (ref 30.0–36.0)
MCV: 90.7 fl (ref 78.0–100.0)
Monocytes Absolute: 0.4 10*3/uL (ref 0.1–1.0)
Monocytes Relative: 6.5 % (ref 3.0–12.0)
NEUTROS PCT: 65 % (ref 43.0–77.0)
Neutro Abs: 3.9 10*3/uL (ref 1.4–7.7)
PLATELETS: 208 10*3/uL (ref 150.0–400.0)
RBC: 4.68 Mil/uL (ref 3.87–5.11)
RDW: 13 % (ref 11.5–15.5)
WBC: 5.9 10*3/uL (ref 4.0–10.5)

## 2018-02-24 LAB — LDL CHOLESTEROL, DIRECT: Direct LDL: 143 mg/dL

## 2018-02-24 LAB — TSH: TSH: 0.37 u[IU]/mL (ref 0.35–4.50)

## 2018-02-25 NOTE — Telephone Encounter (Signed)
Rx refilled 02/23/2018. No further action needed.

## 2018-02-26 ENCOUNTER — Telehealth: Payer: Self-pay | Admitting: Internal Medicine

## 2018-02-26 NOTE — Telephone Encounter (Signed)
Pt states she is still having issues getting this Rx sent to [facility]. This was sent to [facility].  Pt cancelled that Rx , called the office and needed that sent to [facility] as she does not use [facility]. Please send  ALPRAZolam Duanne Moron) 0.5 MG tablet levothyroxine (SYNTHROID, LEVOTHROID) 75 MCG tablet  [facility] - [place], [place] [phone] (Phone) [phone] (Fax)

## 2018-03-02 MED ORDER — LEVOTHYROXINE SODIUM 75 MCG PO TABS: ORAL_TABLET | ORAL | 3 refills | Status: DC

## 2018-03-02 NOTE — Telephone Encounter (Signed)
Pt would like for Alprazolam to be sent to [place]. Pt states she does not use [facility]. Levothyroxine resent to [place] as requested by the pt.

## 2018-03-02 NOTE — Telephone Encounter (Signed)
Pt called in to follow up on request. Pt says that she need her medication.   Pt would like to have Rx sent to mail order. Please assist further    CB: [phone]

## 2018-03-03 ENCOUNTER — Other Ambulatory Visit: Payer: Self-pay | Admitting: Internal Medicine

## 2018-03-03 MED ORDER — ALPRAZOLAM 0.5 MG PO TABS: 0.5000 mg | ORAL_TABLET | Freq: Two times a day (BID) | ORAL | 1 refills | Status: DC | PRN

## 2018-03-03 NOTE — Telephone Encounter (Signed)
Pt is requesting that this RX be re-sent to [facility].  [facility] says they have not received it as of 03/03/18.

## 2018-03-03 NOTE — Telephone Encounter (Signed)
Can you please E-scribe Alprazolam to [facility].

## 2018-03-04 NOTE — Telephone Encounter (Signed)
Rx sent to [facility]. Pt notified!

## 2018-04-02 HISTORY — PX: OTHER SURGICAL HISTORY: SHX169

## 2018-04-28 DIAGNOSIS — L603 Nail dystrophy: Secondary | ICD-10-CM | POA: Diagnosis not present

## 2018-04-28 DIAGNOSIS — B351 Tinea unguium: Secondary | ICD-10-CM | POA: Diagnosis not present

## 2018-04-28 DIAGNOSIS — B078 Other viral warts: Secondary | ICD-10-CM | POA: Diagnosis not present

## 2018-05-06 DIAGNOSIS — D485 Neoplasm of uncertain behavior of skin: Secondary | ICD-10-CM | POA: Diagnosis not present

## 2018-05-06 DIAGNOSIS — L859 Epidermal thickening, unspecified: Secondary | ICD-10-CM | POA: Diagnosis not present

## 2018-05-11 ENCOUNTER — Telehealth: Payer: Self-pay | Admitting: Internal Medicine

## 2018-05-18 NOTE — Telephone Encounter (Signed)
Patient is calling and states [employer] will not fill this because it does not have the correct instructions on there. She states she takes 3 a day as needed and not 2. Please advise. Patient would like a call back from the nurse.  CB# [phone]

## 2018-05-21 NOTE — Telephone Encounter (Signed)
[employer] has already sent pt Rx with 60 tabs. No further action needed!

## 2018-05-27 DIAGNOSIS — Z6822 Body mass index (BMI) 22.0-22.9, adult: Secondary | ICD-10-CM | POA: Diagnosis not present

## 2018-05-27 DIAGNOSIS — M8588 Other specified disorders of bone density and structure, other site: Secondary | ICD-10-CM | POA: Diagnosis not present

## 2018-05-27 DIAGNOSIS — N958 Other specified menopausal and perimenopausal disorders: Secondary | ICD-10-CM | POA: Diagnosis not present

## 2018-05-27 DIAGNOSIS — Z01419 Encounter for gynecological examination (general) (routine) without abnormal findings: Secondary | ICD-10-CM | POA: Diagnosis not present

## 2018-06-01 ENCOUNTER — Encounter: Payer: Self-pay | Admitting: Family Medicine

## 2018-06-01 ENCOUNTER — Ambulatory Visit (INDEPENDENT_AMBULATORY_CARE_PROVIDER_SITE_OTHER): Payer: Medicare HMO | Admitting: Family Medicine

## 2018-06-01 VITALS — BP 160/92 | HR 72 | Temp 98.2°F | Ht 65.0 in | Wt 136.6 lb

## 2018-06-01 DIAGNOSIS — E039 Hypothyroidism, unspecified: Secondary | ICD-10-CM | POA: Diagnosis not present

## 2018-06-01 DIAGNOSIS — R030 Elevated blood-pressure reading, without diagnosis of hypertension: Secondary | ICD-10-CM | POA: Diagnosis not present

## 2018-06-01 DIAGNOSIS — F411 Generalized anxiety disorder: Secondary | ICD-10-CM | POA: Insufficient documentation

## 2018-06-01 DIAGNOSIS — N952 Postmenopausal atrophic vaginitis: Secondary | ICD-10-CM | POA: Diagnosis not present

## 2018-06-01 DIAGNOSIS — Z23 Encounter for immunization: Secondary | ICD-10-CM

## 2018-06-01 DIAGNOSIS — E7800 Pure hypercholesterolemia, unspecified: Secondary | ICD-10-CM | POA: Diagnosis not present

## 2018-06-01 DIAGNOSIS — Z86010 Personal history of colonic polyps: Secondary | ICD-10-CM

## 2018-06-01 DIAGNOSIS — I10 Essential (primary) hypertension: Secondary | ICD-10-CM | POA: Insufficient documentation

## 2018-06-01 MED ORDER — ALPRAZOLAM 0.25 MG PO TABS: 0.2500 mg | ORAL_TABLET | Freq: Two times a day (BID) | ORAL | 1 refills | Status: DC | PRN

## 2018-06-01 NOTE — Addendum Note (Signed)
Addended by: Marin Olp on: 06/01/2018 01:17 PM   Modules accepted: Orders

## 2018-06-01 NOTE — Assessment & Plan Note (Signed)
S: patient seen in June and advised 4-6 week BP recheck- placed on dash diet.   Today poorly controlled on no rx - she states has done well with dash eating plan. Home #s over last 18 days 132/76 BP Readings from Last 3 Encounters:  06/01/18 (!) 148/84  02/23/18 (!) 170/70  01/22/17 126/82  A/P: We discussed blood pressure goal of <140/90- she is at this goal at home. Continue without meds as long as home BP <140/90

## 2018-06-01 NOTE — Assessment & Plan Note (Signed)
S: poorly controlled on no rx. 10 year ascvd risk at 22.8%. Elevated BP strongly contributing to this risk. Dad died at 48 of heart disease- was a long term smoker and in stressful job. Already eats a pretty healthy dash type diet. Walks two days a week Lab Results  Component Value Date   CHOL 242 (H) 02/23/2018   HDL 52.60 02/23/2018   LDLCALC 79 07/25/2014   LDLDIRECT 143.0 02/23/2018   TRIG 243.0 (H) 02/23/2018   CHOLHDL 5 02/23/2018   A/P: continue lifestyle modification- recheck annually. She prefers to hold off on medication given age approaching 22 where risk/benefit is unclear and she has never had CAD/CVA history- and minimal family history other than father who was a smoker, stressful job, heart attack at 8.

## 2018-06-01 NOTE — Progress Notes (Addendum)
Phone: [phone]  Subjective:  Patient presents today to establish care with me as their new primary care provider. Patient was formerly a patient of Dr. Burnice Logan. Chief complaint-noted.   See problem oriented charting ROS- some anxiety. No chest pain or shortness of breath. Skin lesion on finger seeing dermatology. No edema.   The following were reviewed and entered/updated in epic: Past Medical History:  Diagnosis Date  . Hx of colonoscopy with polypectomy   . Hyperlipidemia   . Thyroid disease    Patient Active Problem List   Diagnosis Date Noted  . GAD (generalized anxiety disorder) 06/01/2018    Priority: Medium  . Hypercholesterolemia 12/10/2011    Priority: Medium  . Hypothyroidism 12/10/2011    Priority: Medium  . Other and unspecified ovarian cyst 10/25/2013    Priority: Low  . Vaginal atrophy 06/03/2013    Priority: Low  . Hx of colonic polyps 12/10/2011    Priority: Low  . White coat syndrome without hypertension 06/01/2018   Past Surgical History:  Procedure Laterality Date  . ABDOMINAL HYSTERECTOMY    . APPENDECTOMY    . BREAST SURGERY     LUMPECTOMY- FIBROID TUMOR  . ENTEROCELE REPAIR    . EXCISIONAL HEMORRHOIDECTOMY    . fnger surgery  04/2018    Family History  Problem Relation Age of Onset  . Colon cancer Mother 31  . Anxiety disorder Mother   . Deep vein thrombosis Mother        died age 9  . Heart disease Father 82       smoker, high stress job  . Colon cancer Brother 59  . Colon cancer Maternal Aunt 66  . Colon cancer Son 70  . Colon cancer Paternal Grandmother   . Colon cancer Other 32  . Hodgkin's lymphoma Brother 59  . Breast cancer Daughter     Medications- reviewed and updated Current Outpatient Medications  Medication Sig Dispense Refill  . ALPRAZolam (XANAX) 0.5 MG tablet TAKE 1 TABLET TWICE DAILY AS NEEDED 60 tablet 1  . CALCIUM-MAGNESIUM-VITAMIN D PO Take 1 tablet by mouth 2 (two) times daily.    . Cholecalciferol  (VITAMIN D) 2000 UNITS tablet Take 2,000 Units by mouth daily.    Marland Kitchen conjugated estrogens (PREMARIN) vaginal cream Place 1 gram vaginally 1-2 times weekly 42.5 g 5  . levothyroxine (SYNTHROID, LEVOTHROID) 75 MCG tablet TAKE 1 TABLET (75 MCG TOTAL) DAILY 90 tablet 3  . MAGNESIUM PO Take 1 tablet by mouth daily.    Marland Kitchen OVER THE COUNTER MEDICATION Take 1 capsule by mouth at bedtime. OTC SLEEP AID     Allergies-reviewed and updated Allergies  Allergen Reactions  . Crab [Shellfish Allergy] Other (See Comments)    Severe stomach pains from crab   Social History   Social History Narrative   Married 61 years in 2019. 4 children, 3 living. 5 grandkids.    Poodle 12.5 in 2019.       Housewife.    Some college- [employer]      Hobbies: travel, walking, work out in yard   Objective: BP (!) 160/92 (BP Location: Right Arm, Patient Position: Sitting, Cuff Size: Normal)   Pulse 72   Temp 98.2 F (36.8 C) (Oral)   Ht 5\' 5"  (1.651 m)   Wt 136 lb 9.6 oz (62 kg)   LMP  (LMP Unknown)   SpO2 98%   BMI 22.73 kg/m  Gen: NAD, resting comfortably HEENT: Mucous membranes are moist. Oropharynx normal Neck: no  thyromegaly CV: RRR no murmurs rubs or gallops Lungs: CTAB no crackles, wheeze, rhonchi Abdomen: soft/nontender/nondistended/normal bowel sounds. No rebound or guarding.  Ext: no edema Skin: warm, dry, black lesion in central portion of right 1st finger Neuro: speech normal, moves all extremities, PERRLA  Assessment/Plan:  Other notes: 1. may want second opinion of finger lesion- has had 2 surgeries with Dr. [place] Crane. Has been told wart or corn but has been very resistant  Hypothyroidism S: On thyroid medication- controlled on levothyroxine 75 mcg Lab Results  Component Value Date   TSH 0.37 02/23/2018  A/P: continue current rx  Hypercholesterolemia S: poorly controlled on no rx. 10 year ascvd risk at 22.8%. Elevated BP strongly contributing to this risk. Dad died at 4 of heart disease- was a  long term smoker and in stressful job. Already eats a pretty healthy dash type diet. Walks two days a week Lab Results  Component Value Date   CHOL 242 (H) 02/23/2018   HDL 52.60 02/23/2018   LDLCALC 79 07/25/2014   LDLDIRECT 143.0 02/23/2018   TRIG 243.0 (H) 02/23/2018   CHOLHDL 5 02/23/2018   A/P: continue lifestyle modification- recheck annually. She prefers to hold off on medication given age approaching 60 where risk/benefit is unclear and she has never had CAD/CVA history- and minimal family history other than father who was a smoker, stressful job, heart attack at 45.   GAD (generalized anxiety disorder) S: Patient has been on anxiety medication for some time.  She takes alprazolam 0.5 mg twice a day (half tablet if has to drive). Dr. Ubaldo Glassing gynecologist started her on this 20 years ago. States no anxiety on medication.   GAD7 of 6 today  A/P: we are going to change to 0.25mg  xanax - may use 1-2 tablets twice a day with preference to try to just use one tablet twice a day. Add counseling and see if we can reduce overall dosage of medicine - given increasing risk as you age each year  Consider SSRI addition in future if not making progress  White coat syndrome without hypertension S: patient seen in June and advised 4-6 week BP recheck- placed on dash diet.   Today poorly controlled on no rx - she states has done well with dash eating plan. Home #s over last 18 days 132/76 BP Readings from Last 3 Encounters:  06/01/18 (!) 148/84  02/23/18 (!) 170/70  01/22/17 126/82  A/P: We discussed blood pressure goal of <140/90- she is at this goal at home. Continue without meds as long as home BP <140/90   2 month anxiety check in   Flu shot given today  Meds ordered this encounter  Medications  . DISCONTD: ALPRAZolam (XANAX) 0.25 MG tablet    Sig: Take 1-2 tablets (0.25-0.5 mg total) by mouth 2 (two) times daily as needed for anxiety.    Dispense:  55 tablet    Refill:  1  .  ALPRAZolam (XANAX) 0.25 MG tablet    Sig: Take 1-2 tablets (0.25-0.5 mg total) by mouth 2 (two) times daily as needed for anxiety.    Dispense:  165 tablet    Refill:  1  shredded #55 refill as patient wanted sent to mail order with 3 months worth. This is intended to last full 90 days.   Return precautions advised.  Garret Reddish, MD

## 2018-06-01 NOTE — Addendum Note (Signed)
Addended by: Kevan Ny on: 06/01/2018 11:20 AM   Modules accepted: Orders

## 2018-06-01 NOTE — Patient Instructions (Addendum)
Health Maintenance Due  Topic Date Due  . INFLUENZA VACCINE -pt requests to have done today 04/02/2018   Please check with your pharmacy to see if they have the shingrix vaccine. If they do- please get this immunization and update [place] by phone call or mychart with dates you receive the vaccine  we are going to change to 0.25mg  xanax - may use 1-2 tablets twice a day with preference to try to just use one tablet twice a day. Add counseling and see if we can reduce overall dosage of medicine - given increasing risk as you age each year  We are going to use 55 tablets over a month (which means I want you to try at least 5 days a month to only use one tablet once a day- if you can use only one tablet more frequently- even better)

## 2018-06-01 NOTE — Assessment & Plan Note (Signed)
S: Patient has been on anxiety medication for some time.  She takes alprazolam 0.5 mg twice a day (half tablet if has to drive). Dr. Ubaldo Glassing gynecologist started her on this 20 years ago. States no anxiety on medication.   GAD7 of 6 today  A/P: we are going to change to 0.25mg  xanax - may use 1-2 tablets twice a day with preference to try to just use one tablet twice a day. Add counseling and see if we can reduce overall dosage of medicine - given increasing risk as you age each year  Consider SSRI addition in future if not making progress

## 2018-06-01 NOTE — Assessment & Plan Note (Signed)
S: On thyroid medication- controlled on levothyroxine 75 mcg Lab Results  Component Value Date   TSH 0.37 02/23/2018  A/P: continue current rx

## 2018-07-09 ENCOUNTER — Ambulatory Visit (INDEPENDENT_AMBULATORY_CARE_PROVIDER_SITE_OTHER): Payer: Medicare HMO | Admitting: Family Medicine

## 2018-07-09 ENCOUNTER — Encounter: Payer: Self-pay | Admitting: Family Medicine

## 2018-07-09 VITALS — BP 125/78 | HR 73 | Temp 98.1°F | Ht 65.0 in | Wt 136.8 lb

## 2018-07-09 DIAGNOSIS — N898 Other specified noninflammatory disorders of vagina: Secondary | ICD-10-CM

## 2018-07-09 DIAGNOSIS — R5383 Other fatigue: Secondary | ICD-10-CM | POA: Diagnosis not present

## 2018-07-09 DIAGNOSIS — R1032 Left lower quadrant pain: Secondary | ICD-10-CM

## 2018-07-09 LAB — CBC WITH DIFFERENTIAL/PLATELET
BASOS ABS: 0 10*3/uL (ref 0.0–0.1)
Basophils Relative: 0.4 % (ref 0.0–3.0)
EOS PCT: 3.1 % (ref 0.0–5.0)
Eosinophils Absolute: 0.2 10*3/uL (ref 0.0–0.7)
HEMATOCRIT: 43.3 % (ref 36.0–46.0)
Hemoglobin: 14.5 g/dL (ref 12.0–15.0)
LYMPHS PCT: 23 % (ref 12.0–46.0)
Lymphs Abs: 1.7 10*3/uL (ref 0.7–4.0)
MCHC: 33.4 g/dL (ref 30.0–36.0)
MCV: 91 fl (ref 78.0–100.0)
MONOS PCT: 5.5 % (ref 3.0–12.0)
Monocytes Absolute: 0.4 10*3/uL (ref 0.1–1.0)
NEUTROS ABS: 5 10*3/uL (ref 1.4–7.7)
Neutrophils Relative %: 68 % (ref 43.0–77.0)
Platelets: 223 10*3/uL (ref 150.0–400.0)
RBC: 4.76 Mil/uL (ref 3.87–5.11)
RDW: 13.1 % (ref 11.5–15.5)
WBC: 7.4 10*3/uL (ref 4.0–10.5)

## 2018-07-09 LAB — COMPREHENSIVE METABOLIC PANEL
ALBUMIN: 4.3 g/dL (ref 3.5–5.2)
ALK PHOS: 68 U/L (ref 39–117)
ALT: 13 U/L (ref 0–35)
AST: 20 U/L (ref 0–37)
BUN: 16 mg/dL (ref 6–23)
CALCIUM: 10.1 mg/dL (ref 8.4–10.5)
CO2: 32 mEq/L (ref 19–32)
Chloride: 101 mEq/L (ref 96–112)
Creatinine, Ser: 0.85 mg/dL (ref 0.40–1.20)
GFR: 68.61 mL/min (ref >60.00)
Glucose, Bld: 82 mg/dL (ref 70–99)
POTASSIUM: 4.3 meq/L (ref 3.5–5.1)
Sodium: 138 mEq/L (ref 135–145)
Total Bilirubin: 0.5 mg/dL (ref 0.2–1.2)
Total Protein: 6.7 g/dL (ref 6.0–8.3)

## 2018-07-09 LAB — TSH: TSH: 0.64 u[IU]/mL (ref 0.35–4.50)

## 2018-07-09 LAB — POC URINALSYSI DIPSTICK (AUTOMATED)
BILIRUBIN UA: NEGATIVE
Glucose, UA: NEGATIVE
KETONES UA: NEGATIVE
NITRITE UA: POSITIVE
PROTEIN UA: NEGATIVE
Spec Grav, UA: 1.01 (ref 1.010–1.025)
Urobilinogen, UA: 0.2 E.U./dL
pH, UA: 7 (ref 5.0–8.0)

## 2018-07-09 NOTE — Patient Instructions (Signed)
Your initial urine looks like possible urinary tract infection.  We are getting a urine culture to make sure we treat you with the right antibiotic.  If your urinary culture does not show an infection we will get a CT scan of your abdomen and pelvis.  We will also get a CT scan if you do not improve on the antibiotic.  I need you to be in close contact with me on if you are improving with treatment.  Please stop by lab before you go

## 2018-07-09 NOTE — Progress Notes (Signed)
Subjective:  [name] is a 77 y.o. year old very pleasant female patient who presents for/with See problem oriented charting ROS-no fever or chills.  No nausea or vomiting.  Does have left lower quadrant and left upper quadrant pain and some flank pain.  Has noted some bloody vaginal discharge x1.  Past Medical History-  Patient Active Problem List   Diagnosis Date Noted  . GAD (generalized anxiety disorder) 06/01/2018    Priority: Medium  . Hypercholesterolemia 12/10/2011    Priority: Medium  . Hypothyroidism 12/10/2011    Priority: Medium  . Other and unspecified ovarian cyst 10/25/2013    Priority: Low  . Vaginal atrophy 06/03/2013    Priority: Low  . Hx of colonic polyps 12/10/2011    Priority: Low  . White coat syndrome without hypertension 06/01/2018   Medications- reviewed and updated Current Outpatient Medications  Medication Sig Dispense Refill  . ALPRAZolam (XANAX) 0.25 MG tablet Take 1-2 tablets (0.25-0.5 mg total) by mouth 2 (two) times daily as needed for anxiety. 165 tablet 1  . CALCIUM-MAGNESIUM-VITAMIN D PO Take 1 tablet by mouth 2 (two) times daily.    . Cholecalciferol (VITAMIN D) 2000 UNITS tablet Take 2,000 Units by mouth daily.    Marland Kitchen conjugated estrogens (PREMARIN) vaginal cream Place 1 gram vaginally 1-2 times weekly 42.5 g 5  . levothyroxine (SYNTHROID, LEVOTHROID) 75 MCG tablet TAKE 1 TABLET (75 MCG TOTAL) DAILY 90 tablet 3  . MAGNESIUM PO Take 1 tablet by mouth daily.    Marland Kitchen OVER THE COUNTER MEDICATION Take 1 capsule by mouth at bedtime. OTC SLEEP AID     No current facility-administered medications for this visit.    Objective: BP 125/78 (BP Location: Left Arm, Patient Position: Sitting, Cuff Size: Large)   Pulse 73   Temp 98.1 F (36.7 C) (Oral)   Ht 5\' 5"  (1.651 m)   Wt 136 lb 12.8 oz (62.1 kg)   LMP  (LMP Unknown)   SpO2 96%   BMI 22.76 kg/m  Gen: NAD, resting comfortably CV: RRR no murmurs rubs or gallops Lungs: CTAB no crackles, wheeze,  rhonchi Abdomen: soft/moderate tenderness far lateral to umbilicus/nondistended/normal bowel sounds. No rebound or guarding.  Ext: no edema Skin: warm, dry  Results for orders placed or performed in visit on 07/09/18 (from the past 24 hour(s))  POCT Urinalysis Dipstick (Automated)     Status: Abnormal   Collection Time: 07/09/18  1:01 PM  Result Value Ref Range   Color, UA yellow    Clarity, UA cloudy    Glucose, UA Negative Negative   Bilirubin, UA negative    Ketones, UA negative    Spec Grav, UA 1.010 1.010 - 1.025   Blood, UA 2+    pH, UA 7.0 5.0 - 8.0   Protein, UA Negative Negative   Urobilinogen, UA 0.2 0.2 or 1.0 E.U./dL   Nitrite, UA positive    Leukocytes, UA Moderate (2+) (A) Negative     Assessment/Plan:  LLQ pain, LUQ pain S: Patient with left lower abdomen and then up to left upper quadrant and then lateral to both of these areas. Yesterday noted some left CVA tenderness. No fever or chills with it. Pain for a few months- didn't mention last visit as was hoping would go away. When she lays down on back at night it seems to get better. If lays on her side it gets worse. With sitting feels slightly worse than with walking. Pain level at its worst is  6/10. Goes down to 0/10 is its bed. Hurts off and on through the whole day.   She states she has had blood in urine as far back as she can remember. She has seen 2 urologists in past- was released by them. Has some urinary frequency and urgency. No dysuria. No nocturia. Blood vaginal discharge 1 day last week with no recurrence - monogomous and very low risk STD  She has been dealing with some fatigue as well while this has been going on.  A/P: From avs  "Your initial urine looks like possible urinary tract infection.  We are getting a urine culture to make sure we treat you with the right antibiotic.  If your urinary culture does not show an infection we will get a CT scan of your abdomen and pelvis.  We will also get a  CT scan if you do not improve on the antibiotic.  I need you to be in close contact with me on if you are improving with treatment."  Due to fatigue- we will update some bloodwork as well.  Also reasonable to do this to check kidney function.  She knows if she has any recurrence of vaginal discharge that is bloody-we need to do a pelvic exam and see her back.  Can also see gynecologist she is seen in the past.  Future Appointments  Date Time Provider [place]  08/31/2018 11:00 AM Marin Olp, MD [facility]   Lab/Order associations: Left lower quadrant pain - Plan: Urine Culture, POCT Urinalysis Dipstick (Automated), Urine Culture  Bloody vaginal discharge - Plan: Urine Culture, POCT Urinalysis Dipstick (Automated), Urine Culture  Fatigue, unspecified type - Plan: CBC with Differential/Platelet, Comprehensive metabolic panel, TSH  No orders of the defined types were placed in this encounter.   Return precautions advised.  Garret Reddish, MD

## 2018-07-11 LAB — URINE CULTURE
MICRO NUMBER:: 91342704
SPECIMEN QUALITY:: ADEQUATE

## 2018-07-11 MED ORDER — CEPHALEXIN 500 MG PO CAPS: 500.0000 mg | ORAL_CAPSULE | Freq: Three times a day (TID) | ORAL | 0 refills | Status: AC

## 2018-07-11 NOTE — Addendum Note (Signed)
Addended by: Marin Olp on: 07/11/2018 12:52 PM   Modules accepted: Orders

## 2018-07-24 ENCOUNTER — Ambulatory Visit (INDEPENDENT_AMBULATORY_CARE_PROVIDER_SITE_OTHER): Payer: Medicare HMO | Admitting: Family Medicine

## 2018-07-24 ENCOUNTER — Encounter: Payer: Self-pay | Admitting: Family Medicine

## 2018-07-24 ENCOUNTER — Ambulatory Visit: Payer: Medicare HMO | Admitting: Family Medicine

## 2018-07-24 VITALS — BP 108/68 | HR 70 | Temp 98.4°F | Ht 65.0 in | Wt 137.4 lb

## 2018-07-24 DIAGNOSIS — R1012 Left upper quadrant pain: Secondary | ICD-10-CM

## 2018-07-24 DIAGNOSIS — R1032 Left lower quadrant pain: Secondary | ICD-10-CM | POA: Diagnosis not present

## 2018-07-24 DIAGNOSIS — G8929 Other chronic pain: Secondary | ICD-10-CM | POA: Diagnosis not present

## 2018-07-24 NOTE — Patient Instructions (Signed)
Since your symptoms have been so stable-I am okay with them postponing the CT scan until after Thanksgiving.  You should be getting a call in the next 2 weeks about this, call us if you have not heard in that timeframe.  Please stop by lab before you go- if you have another urinary tract infection or it looks like a probable infection-we will go ahead and treat you again since you did better on the antibiotic.  You may hear from me today tomorrow or Monday on this.

## 2018-07-24 NOTE — Progress Notes (Signed)
Subjective:  [name] is a 77 y.o. year old very pleasant female patient who presents for/with See problem oriented charting ROS-no fever, chills, nausea, vomiting, constipation.  Urine urgency and frequency have resolved  Past Medical History-  Patient Active Problem List   Diagnosis Date Noted  . GAD (generalized anxiety disorder) 06/01/2018    Priority: Medium  . Hypercholesterolemia 12/10/2011    Priority: Medium  . Hypothyroidism 12/10/2011    Priority: Medium  . Other and unspecified ovarian cyst 10/25/2013    Priority: Low  . Vaginal atrophy 06/03/2013    Priority: Low  . Hx of colonic polyps 12/10/2011    Priority: Low  . 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 ALPRAZolam (XANAX) 0.25 MG tablet Take 1-2 tablets (0.25-0.5 mg total) by mouth 2 (two) times daily as needed for anxiety. 165 tablet 1  . CALCIUM-MAGNESIUM-VITAMIN D PO Take 1 tablet by mouth 2 (two) times daily.    . Cholecalciferol (VITAMIN D) 2000 UNITS tablet Take 2,000 Units by mouth daily.    Marland Kitchen conjugated estrogens (PREMARIN) vaginal cream Place 1 gram vaginally 1-2 times weekly 42.5 g 5  . levothyroxine (SYNTHROID, LEVOTHROID) 75 MCG tablet TAKE 1 TABLET (75 MCG TOTAL) DAILY 90 tablet 3  . MAGNESIUM PO Take 1 tablet by mouth daily.    Marland Kitchen OVER THE COUNTER MEDICATION Take 1 capsule by mouth at bedtime. OTC SLEEP AID     No current facility-administered medications for this visit.     Objective: BP 108/68 (BP Location: Left Arm, Patient Position: Sitting, Cuff Size: Large)   Pulse 70   Temp 98.4 F (36.9 C) (Oral)   Ht 5\' 5"  (1.651 m)   Wt 137 lb 6.4 oz (62.3 kg)   LMP  (LMP Unknown)   SpO2 98%   BMI 22.86 kg/m  Gen: NAD, resting comfortably CV: RRR no murmurs rubs or gallops Lungs: CTAB no crackles, wheeze, rhonchi Abdomen: soft/tenderness noted in left upper quadrant and left  lower quadrant but mild/nondistended/normal bowel sounds. No rebound or guarding.  Ext: no edema Skin: warm, dry MSK: some low back pain  With palpation- mild CVA tenderness  Assessment/Plan:    Left lower and upper quadrant abdominal pain - Plan: CT Abdomen Pelvis W Contrast, POCT Urinalysis Dipstick (Automated), Urine Culture  S: Patient was seen a little over 2 weeks ago with left lower quadrant and left upper quadrant abdominal pain- also had some left CVA tenderness.  Pain has been going on for a few months but was worsening up to 6 out of 10.  Long-term history of blood in her urine and has seen to urologist in the past and was released by them.  She had noted urinary frequency and urgency.  She had one episode of bloody vaginal discharge with no recurrence over a week before last visit.  No new sexual partners.  She is status post hysterectomy. She has been dealing with some fatigue as well.  Urine culture showed E. coli UTI that was sensitive to everything but quinolones.  We treated her with a week of Keflex 500 mg 3 times daily.  Blood work for fatigue done last visit-reassuring CBC, CMP, TSH.  She had some improvement of her symptoms on the Keflex but when she finished the course of antibiotics- symptoms have seemed to worsen again.  She has not had another episode of bloody vaginal discharge fortunately.Regular bowel  movement daily-not firm.  A/P: 77 year old female with several months of left lower quadrant, left upper quadrant and left CVA tenderness.  History of hematuria- has been released by urology in the past-symptoms persist.  I believe it is prudent to get a CT scan of her abdomen and pelvis with contrast- this will rule out kidney stones or a lingering diverticulitis.  If this scan is negative-consider getting her plugged back into urology for the hematuria.  This also could be a musculoskeletal issue potentially- would approach from that perspective after urology evaluation if CT  scan also unrevealing.  We also opted to repeat urinalysis as well as urine culture to make sure the infection eradicated- unfortunately does not appear urinalysis information was entered-we will reach out to team to try to have this entered on Monday  Future Appointments  Date Time Provider [place]  08/31/2018 11:00 AM Marin Olp, MD [facility]   Lab/Order associations: Left lower quadrant abdominal pain - Plan: CT Abdomen Pelvis W Contrast, POCT Urinalysis Dipstick (Automated), Urine Culture  Return precautions advised-verbally (not on avs).  Garret Reddish, MD

## 2018-07-25 LAB — URINE CULTURE
MICRO NUMBER: 91411475
Result:: NO GROWTH
SPECIMEN QUALITY: ADEQUATE

## 2018-08-13 ENCOUNTER — Ambulatory Visit (INDEPENDENT_AMBULATORY_CARE_PROVIDER_SITE_OTHER)
Admission: RE | Admit: 2018-08-13 | Discharge: 2018-08-13 | Disposition: A | Discharge status: Home / Self Care | Payer: Medicare HMO | Source: Ambulatory Visit | Attending: Family Medicine | Admitting: Family Medicine

## 2018-08-13 DIAGNOSIS — R1032 Left lower quadrant pain: Secondary | ICD-10-CM | POA: Diagnosis not present

## 2018-08-13 DIAGNOSIS — R197 Diarrhea, unspecified: Secondary | ICD-10-CM | POA: Diagnosis not present

## 2018-08-13 DIAGNOSIS — R109 Unspecified abdominal pain: Secondary | ICD-10-CM | POA: Diagnosis not present

## 2018-08-13 MED ORDER — IOPAMIDOL (ISOVUE-300) INJECTION 61%
100.0000 mL | Freq: Once | INTRAVENOUS | Status: AC | PRN
  Administered 2018-08-13: 100 mL via INTRAVENOUS

## 2018-08-17 ENCOUNTER — Telehealth: Payer: Self-pay | Admitting: Family Medicine

## 2018-08-17 NOTE — Telephone Encounter (Signed)
Copied from [place] [id number]. Topic: General - Inquiry >> Aug 17, 2018  2:47 PM Margot Ables wrote: Reason for CRM: pt calling to check on CT results. Please advise.

## 2018-08-17 NOTE — Telephone Encounter (Signed)
See note

## 2018-08-18 ENCOUNTER — Other Ambulatory Visit: Payer: Self-pay

## 2018-08-18 DIAGNOSIS — R319 Hematuria, unspecified: Secondary | ICD-10-CM

## 2018-08-19 ENCOUNTER — Other Ambulatory Visit (INDEPENDENT_AMBULATORY_CARE_PROVIDER_SITE_OTHER): Payer: Medicare HMO

## 2018-08-19 DIAGNOSIS — R1032 Left lower quadrant pain: Secondary | ICD-10-CM

## 2018-08-19 DIAGNOSIS — R319 Hematuria, unspecified: Secondary | ICD-10-CM

## 2018-08-19 LAB — URINALYSIS, MICROSCOPIC ONLY

## 2018-08-19 NOTE — Telephone Encounter (Signed)
Per CT note -  Notes recorded by [employer], Lonia Mad, LPN on 58/83/2549 at 2:12 PM EST Spoke with patient who verbalized understanding. She will come by tomorrow for her lab appointment I scheduled. ------  Notes recorded by Mariam Dollar, Lonia Mad, LPN on 82/64/1583 at 2:08 PM EST Order entered for microscopic urine  ------  Notes recorded by Marin Olp, MD on 08/13/2018 at 10:22 PM EST No obvious cause of abdominal pain on examination- other than she does have a very long colon and a fair amount of stool in the colon- how are her bowel movements doing? If there is any diarrhea-hold off on the next steps but if she is having infrequent or even regular stooling but it is not loose-could trial MiraLAX 1 capful daily for a week and then if loose stools reduce to half capful. If you have new or worsening symptoms-lets return and repeat evaluation in the office.  Also want to get urine microscopic examination under hematuria-please have her back in for this. If she has blood in her urine may get urology's opinion.  They did note some arthritic changes in the back but I do not think that is causing her symptoms.

## 2018-08-31 ENCOUNTER — Ambulatory Visit: Payer: Medicare HMO | Admitting: Family Medicine

## 2018-09-04 ENCOUNTER — Telehealth: Payer: Self-pay | Admitting: Family Medicine

## 2018-09-04 NOTE — Telephone Encounter (Signed)
See note

## 2018-09-04 NOTE — Telephone Encounter (Signed)
Copied from [place] [id number]. Topic: Quick Communication - Rx Refill/Question >> Sep 04, 2018 10:37 AM Windy Kalata wrote: Medication: ALPRAZolam Duanne Moron) 0.25 MG tablet    Has the patient contacted their pharmacy? Yes.   (Agent: If no, request that the patient contact the pharmacy for the refill.) (Agent: If yes, when and what did the pharmacy advise?) Call office for refill  Preferred Pharmacy (with phone number or street name): [facility], [place] [phone] (Phone) [phone] (Fax)    Agent: Please be advised that RX refills may take up to 3 business days. We ask that you follow-up with your pharmacy.

## 2018-09-07 MED ORDER — ALPRAZOLAM 0.25 MG PO TABS: 0.2500 mg | ORAL_TABLET | Freq: Two times a day (BID) | ORAL | 1 refills | Status: DC | PRN

## 2018-09-10 ENCOUNTER — Encounter: Payer: Self-pay | Admitting: Family Medicine

## 2018-09-10 ENCOUNTER — Ambulatory Visit (INDEPENDENT_AMBULATORY_CARE_PROVIDER_SITE_OTHER): Payer: Medicare HMO | Admitting: Family Medicine

## 2018-09-10 VITALS — BP 120/76 | HR 76 | Temp 98.2°F | Ht 65.0 in | Wt 140.0 lb

## 2018-09-10 DIAGNOSIS — M545 Low back pain, unspecified: Secondary | ICD-10-CM

## 2018-09-10 DIAGNOSIS — G8929 Other chronic pain: Secondary | ICD-10-CM

## 2018-09-10 MED ORDER — MELOXICAM 7.5 MG PO TABS: 7.5000 mg | ORAL_TABLET | Freq: Every day | ORAL | 0 refills | Status: DC

## 2018-09-10 NOTE — Patient Instructions (Addendum)
I think your pain is related to either your arthritis in your back or some irritation of the SI joint-sacroiliac joint  Lets try meloxicam 7.5 mg for 10 days to try to cool off the inflammation.  After you finish this if it is helpful-I am okay with you sparingly using Aleve perhaps once a week or so  I want you to ice your left low back and the area that was painful on exam today for 3x a day for 20 minutes. After 3 days I want you to try heat 3x a day for 20 minutes.   If this is not beneficial-we also have an excellent sports medicine physician [name] who we could schedule you a visit with.  Give [place] an update if you are not doing significantly better in 2 or 3 weeks

## 2018-09-10 NOTE — Progress Notes (Signed)
Subjective:  [name] is a 78 y.o. year old very pleasant female patient who presents for/with See problem oriented charting ROS-no leg weakness or paresthesias reported.  No saddle anesthesia.  No history of trauma or cancer  Past Medical History-  Patient Active Problem List   Diagnosis Date Noted  . GAD (generalized anxiety disorder) 06/01/2018    Priority: Medium  . Hypercholesterolemia 12/10/2011    Priority: Medium  . Hypothyroidism 12/10/2011    Priority: Medium  . Other and unspecified ovarian cyst 10/25/2013    Priority: Low  . Vaginal atrophy 06/03/2013    Priority: Low  . Hx of colonic polyps 12/10/2011    Priority: Low  . 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 ALPRAZolam (XANAX) 0.25 MG tablet Take 1-2 tablets (0.25-0.5 mg total) by mouth 2 (two) times daily as needed for anxiety. 165 tablet 1  . CALCIUM-MAGNESIUM-VITAMIN D PO Take 1 tablet by mouth 2 (two) times daily.    . Cholecalciferol (VITAMIN D) 2000 UNITS tablet Take 2,000 Units by mouth daily.    Marland Kitchen conjugated estrogens (PREMARIN) vaginal cream Place 1 gram vaginally 1-2 times weekly 42.5 g 5  . levothyroxine (SYNTHROID, LEVOTHROID) 75 MCG tablet TAKE 1 TABLET (75 MCG TOTAL) DAILY 90 tablet 3  . MAGNESIUM PO Take 1 tablet by mouth daily.    Marland Kitchen OVER THE COUNTER MEDICATION Take 1 capsule by mouth at bedtime. OTC SLEEP AID     Objective: BP 120/76 (BP Location: Left Arm, Patient Position: Sitting, Cuff Size: Large)   Pulse 76   Temp 98.2 F (36.8 C) (Oral)   Ht 5\' 5"  (1.651 m)   Wt 140 lb (63.5 kg)   LMP  (LMP Unknown)   SpO2 97%   BMI 23.30 kg/m  Gen: NAD, resting comfortably CV: RRR no murmurs rubs or gallops Lungs: CTAB no crackles, wheeze, rhonchi Abdomen: soft/nontender/nondistended/normal bowel sounds.  Ext: no edema Skin: warm, dry  Back - Normal skin, Spine with normal  alignment and no deformity.  No tenderness to vertebral process palpation.  Paraspinous muscles are not tender and without spasm.  Negative Straight leg raise.  Patient with significant pain over SI joint with [name] testing Neuro- no saddle anesthesia, 5/5 strength lower extremities  Assessment/Plan:   Chronic left-sided low back pain without sciatica  S:Patient presents with low back pain that spans the whole back but is worse on the left side. No radiation into the legs. Yesterday got all the way up to 10/10. Today 3/10- that has been typical over last few weeks (comes and goes still). Not a constant pain- seems to come and go. Didn't take anything yesterday and just went away on its own. [name] is the worse it has been.  She is not sure of any clear triggers.  Aching sensation.  Pain has been going on for at least 2 months- actually had this at time of reassuring abdominal CT- but the change has been intensity and fact it is going across the low back.   Denies urinary issues right now-in November we did a urine culture and then urine microscopic a month later which was normal.   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 Some degenerative changes also noted in the facet joints of L4-5 and L5-S1. The SI joints appear corticated." A/P: Left low  back pain which radiates across to the right side-she has pain over her SI joint with [name] testing today.  She also has some arthritis on her CT scan of her abdomen from a few months ago.  We discussed a trial of meloxicam for 10 days.  We are also getting try icing for 3 days and then heat.  If she is not improving in 2 or 3 weeks she will see [name].  Only contraindication NSAIDs is her age-I told her she could sparingly use ibuprofen or Aleve in the future if the meloxicam is helpful hopefully once a week or less.  Lab/Order associations:  Meds ordered this encounter   Medications  . meloxicam (MOBIC) 7.5 MG tablet    Sig: Take 1 tablet (7.5 mg total) by mouth daily.    Dispense:  10 tablet    Refill:  0  Since patient was having some left low back pain during evaluation for her abdomen- I consider this an established and worsening problem  Return precautions advised.  Garret Reddish, MD

## 2018-09-12 ENCOUNTER — Other Ambulatory Visit: Payer: Self-pay | Admitting: Family Medicine

## 2018-09-18 NOTE — Telephone Encounter (Signed)
Patient calling back regarding this refill request. Patient states she has spoken with representative at [facility] and they advised her that they never received faxed request. Patient inquired if this can be refaxed to an updated number for [place]. Please advise.    [place] fax:  [phone]

## 2018-09-18 NOTE — Telephone Encounter (Signed)
See request °

## 2018-09-18 NOTE — Telephone Encounter (Signed)
Refaxed prescription request to pharmacy

## 2018-09-28 ENCOUNTER — Encounter: Payer: Self-pay | Admitting: Family Medicine

## 2018-09-28 ENCOUNTER — Ambulatory Visit (INDEPENDENT_AMBULATORY_CARE_PROVIDER_SITE_OTHER): Payer: Medicare HMO | Admitting: Family Medicine

## 2018-09-28 VITALS — BP 144/72 | HR 69 | Temp 98.3°F | Ht 65.0 in | Wt 137.4 lb

## 2018-09-28 DIAGNOSIS — J069 Acute upper respiratory infection, unspecified: Secondary | ICD-10-CM | POA: Diagnosis not present

## 2018-09-28 DIAGNOSIS — K5900 Constipation, unspecified: Secondary | ICD-10-CM | POA: Diagnosis not present

## 2018-09-28 DIAGNOSIS — R030 Elevated blood-pressure reading, without diagnosis of hypertension: Secondary | ICD-10-CM | POA: Diagnosis not present

## 2018-09-28 NOTE — Progress Notes (Signed)
PCP: Marin Olp, MD  Subjective:  [name] is a 78 y.o. year old very pleasant female patient who presents with Upper Respiratory infection symptoms including nasal congestion, sore throat, cough though mild and improving  Husband has been sick with pneumonia 2 weeks ago she started with sore throat.  Noted watery nose Started with cold medicine- vicks capsules daytime and nighttime Symptoms seemed to be getting better but throat seemed to get better then get worse For last 3 days the throat has been better- no pain at all Has some pain in left ear- now resolved Seems to hurt in ear when the throat hurts.  Some cough but doing better. Not productive No fever, shortness of breath, wheezing -sick contacts/travel/risks: denies flu exposure.  -Hx of: allergies  ROS-denies fever, SOB, NVD, tooth pain  Pertinent Past Medical History-  Patient Active Problem List   Diagnosis Date Noted  . GAD (generalized anxiety disorder) 06/01/2018    Priority: Medium  . Hypercholesterolemia 12/10/2011    Priority: Medium  . Hypothyroidism 12/10/2011    Priority: Medium  . Other and unspecified ovarian cyst 10/25/2013    Priority: Low  . Vaginal atrophy 06/03/2013    Priority: Low  . Hx of colonic polyps 12/10/2011    Priority: Low  . White coat syndrome without hypertension 06/01/2018   Medications- reviewed  Current Outpatient Medications  Medication Sig Dispense Refill  . ALPRAZolam (XANAX) 0.25 MG tablet Take 1-2 tablets (0.25-0.5 mg total) by mouth 2 (two) times daily as needed for anxiety. 165 tablet 1  . CALCIUM-MAGNESIUM-VITAMIN D PO Take 1 tablet by mouth 2 (two) times daily.    . Cholecalciferol (VITAMIN D) 2000 UNITS tablet Take 2,000 Units by mouth daily.    Marland Kitchen conjugated estrogens (PREMARIN) vaginal cream Place 1 gram vaginally 1-2 times weekly 42.5 g 5  . levothyroxine (SYNTHROID, LEVOTHROID) 75 MCG tablet TAKE 1 TABLET (75 MCG TOTAL) DAILY 90 tablet 3  . MAGNESIUM PO  Take 1 tablet by mouth daily.    . meloxicam (MOBIC) 7.5 MG tablet Take 1 tablet (7.5 mg total) by mouth daily. 10 tablet 0  . OVER THE COUNTER MEDICATION Take 1 capsule by mouth at bedtime. OTC SLEEP AID     No current facility-administered medications for this visit.     Objective: BP (!) 144/72 (BP Location: Left Arm, Patient Position: Sitting, Cuff Size: Large)   Pulse 69   Temp 98.3 F (36.8 C) (Oral)   Ht 5\' 5"  (1.651 m)   Wt 137 lb 6.4 oz (62.3 kg)   LMP  (LMP Unknown)   SpO2 97%   BMI 22.86 kg/m  Gen: NAD, resting comfortably HEENT: Turbinates erythematous, TM normal, pharynx mildly erythematous with no tonsilar exudate or edema, no sinus tenderness CV: RRR no murmurs rubs or gallops Lungs: CTAB no crackles, wheeze, rhonchi Abdomen: soft/nontender/nondistended/normal bowel sounds. No rebound or guarding.  Ext: no edema Skin: warm, dry, no rash Neuro: grossly normal, moves all extremities  Assessment/Plan:  Upper Respiratory infection Appears to be late in this and is improving significantly  History and exam today are suggestive of viral infection most likely due to upper respiratory infection but luckily patient has shown significant improvement. Symptomatic treatment with: Over-the-counter options are okay-we discussed avoiding any decongestants due to blood pressure issues.  The Vicks daytime medication may have a decongestant encouraged her to avoid- particularly with blood pressure slightly high today  We discussed that we did not find any infection that  had higher probability of being bacterial such as pneumonia or strep throat. We discussed signs that bacterial infection may have developed particularly fever or shortness of breath. Likely course of 2 weeks though may have some mild lingering symptoms over the next week.   Finally, we reviewed reasons to return to care including if symptoms worsen or persist or new concerns arise- once again particularly shortness of  breath or fever.  Also-Trying Metamucil for constipation-twice a day did not work-going to try 3 times a day  Does have white coat hypertension- I also wonder if her over-the-counter medications include a decongestant which may be raising blood pressure today-see comments above but encouraged to avoid.  Last blood pressure was normal-we will continue to trend alone for now  Garret Reddish, MD

## 2018-09-28 NOTE — Patient Instructions (Addendum)
It looks like you are at the tail end of an upper respiratory infection. Usually these last for about 2 weeks and cause symptoms like you are having. You may still have some mild lingering symptoms over the next week as your body tries to clear this but you should not have fever, shortness of breath, worsening symptoms. See [place] back if any of these occur  Trial the metamucil 3x a day- we may need to consider miralax if that doesn't work. With miralax may trial half capful daily - hold for loose stools. If not having a good bowel movement at least every other day on this could go to full capful.

## 2018-11-19 ENCOUNTER — Encounter: Payer: Self-pay | Admitting: Internal Medicine

## 2018-11-19 ENCOUNTER — Other Ambulatory Visit: Payer: Self-pay | Admitting: Family Medicine

## 2018-11-19 MED ORDER — ALPRAZOLAM 0.25 MG PO TABS: 0.2500 mg | ORAL_TABLET | Freq: Two times a day (BID) | ORAL | 1 refills | Status: DC | PRN

## 2018-11-19 NOTE — Telephone Encounter (Signed)
See note

## 2018-11-19 NOTE — Telephone Encounter (Signed)
Copied from [place] [id number]. Topic: Quick Communication - Rx Refill/Question >> Nov 19, 2018 11:45 AM Reyne Dumas L wrote: Medication: ALPRAZolam Duanne Moron) 0.25 MG tablet  Has the patient contacted their pharmacy? Yes - states they need new refill (Agent: If no, request that the patient contact the pharmacy for the refill.) (Agent: If yes, when and what did the pharmacy advise?)  Preferred Pharmacy (with phone number or street name): [place], [place] [phone] (Phone) [phone] (Fax)    Agent: Please be advised that RX refills may take up to 3 business days. We ask that you follow-up with your pharmacy.

## 2018-11-19 NOTE — Telephone Encounter (Signed)
Last OV 09/28/2018 Last refill 09/07/2018 #165/1 (printed) Next OV not scheduled

## 2018-12-07 ENCOUNTER — Other Ambulatory Visit: Payer: Self-pay | Admitting: Family Medicine

## 2018-12-08 MED ORDER — LEVOTHYROXINE SODIUM 75 MCG PO TABS: ORAL_TABLET | ORAL | 3 refills | Status: DC

## 2018-12-08 NOTE — Telephone Encounter (Signed)
Error previous message 3 refills left.

## 2018-12-08 NOTE — Telephone Encounter (Signed)
[name], schedule doxy appointment due to no refills remaining

## 2018-12-08 NOTE — Telephone Encounter (Signed)
This Rx was not prescribed by you Okay to send in pt current dose since her labs were normal some months ago?  07/09/2018 TSH labs results= 0.64   Please advise

## 2018-12-25 ENCOUNTER — Other Ambulatory Visit: Payer: Self-pay | Admitting: Family Medicine

## 2018-12-25 NOTE — Telephone Encounter (Signed)
Requested medication (s) are due for refill today: yes  Requested medication (s) are on the active medication list: yes  Last refill: 11/19/2018  Future visit scheduled: no  Notes to clinic:not delegated       Requested Prescriptions  Pending Prescriptions Disp Refills   ALPRAZolam (XANAX) 0.25 MG tablet 165 tablet 1    Sig: Take 1-2 tablets (0.25-0.5 mg total) by mouth 2 (two) times daily as needed for anxiety.     Not Delegated - Psychiatry:  Anxiolytics/Hypnotics Failed - 12/25/2018  3:08 PM      Failed - This refill cannot be delegated      Failed - Urine Drug Screen completed in last 360 days.      Passed - Valid encounter within last 6 months    Recent Outpatient Visits          2 months ago Viral URI   [place] [facility], Brayton Mars, MD   3 months ago Chronic left-sided low back pain without sciatica   [place] [facility], Brayton Mars, MD   5 months ago Left lower quadrant abdominal pain   [facility], Brayton Mars, MD   5 months ago Left lower quadrant pain   [place] [facility], Brayton Mars, MD   6 months ago Hypercholesterolemia    [facility] [address], Brayton Mars, MD

## 2018-12-25 NOTE — Telephone Encounter (Signed)
See note, patient has had visit in last 6 months.

## 2018-12-28 ENCOUNTER — Ambulatory Visit (INDEPENDENT_AMBULATORY_CARE_PROVIDER_SITE_OTHER): Payer: Medicare HMO | Admitting: Family Medicine

## 2018-12-28 ENCOUNTER — Encounter: Payer: Self-pay | Admitting: Family Medicine

## 2018-12-28 VITALS — BP 137/60 | HR 71 | Temp 98.0°F | Ht 65.0 in | Wt 132.5 lb

## 2018-12-28 DIAGNOSIS — R030 Elevated blood-pressure reading, without diagnosis of hypertension: Secondary | ICD-10-CM | POA: Diagnosis not present

## 2018-12-28 DIAGNOSIS — F411 Generalized anxiety disorder: Secondary | ICD-10-CM

## 2018-12-28 MED ORDER — ALPRAZOLAM 0.25 MG PO TABS: 0.2500 mg | ORAL_TABLET | Freq: Two times a day (BID) | ORAL | 3 refills | Status: DC | PRN

## 2018-12-28 MED ORDER — SERTRALINE HCL 25 MG PO TABS: 25.0000 mg | ORAL_TABLET | Freq: Every day | ORAL | 1 refills | Status: DC

## 2018-12-28 NOTE — Progress Notes (Addendum)
Phone [phone]   Subjective:  Virtual visit via phonenote This visit type was conducted due to national recommendations for restrictions regarding the COVID-19 Pandemic (e.g. social distancing).  This format is felt to be most appropriate for this patient at this time balancing risks to patient and risks to population by having him in for in person visit.  All issues noted in this document were discussed and addressed.  No physical exam was performed (except for noted visual exam or audio findings with Telehealth visits).  The patient has consented to conduct a Telehealth visit and understands insurance will be billed.   Our team/I connected with Joylene Grapes on 12/28/18 at  2:20 PM EDT by phone (patient did not have equipment for webex) and verified that I am speaking with the correct person using two identifiers.  Location patient: [facility] Location provider: [facility], office Persons participating in the virtual visit:  patient  Time on phone: 15 minutes Counseling provided about covid 19, stress anxiety  Our team/I discussed the limitations of evaluation and management by telemedicine and the availability of in person appointments. In light of current covid-19 pandemic, patient also understands that we are trying to protect them by minimizing in office contact if at all possible.  The patient expressed consent for telemedicine visit and agreed to proceed. Patient understands insurance will be billed.   ROS- no SI. Admits to high anxiety.    Past Medical History-  Patient Active Problem List   Diagnosis Date Noted  . GAD (generalized anxiety disorder) 06/01/2018    Priority: Medium  . Hypercholesterolemia 12/10/2011    Priority: Medium  . Hypothyroidism 12/10/2011    Priority: Medium  . Other and unspecified ovarian cyst 10/25/2013    Priority: Low  . Vaginal atrophy 06/03/2013    Priority: Low  . Hx of colonic polyps 12/10/2011    Priority: Low  . White coat syndrome  without hypertension 06/01/2018    Medications- reviewed and updated Current Outpatient Medications  Medication Sig Dispense Refill  . ALPRAZolam (XANAX) 0.25 MG tablet Take 1-2 tablets (0.25-0.5 mg total) by mouth 2 (two) times daily as needed for anxiety. 120 tablet 3  . CALCIUM-MAGNESIUM-VITAMIN D PO Take 1 tablet by mouth 2 (two) times daily.    . Cholecalciferol (VITAMIN D) 2000 UNITS tablet Take 2,000 Units by mouth daily.    Marland Kitchen conjugated estrogens (PREMARIN) vaginal cream Place 1 gram vaginally 1-2 times weekly 42.5 g 5  . levothyroxine (SYNTHROID, LEVOTHROID) 75 MCG tablet TAKE 1 TABLET (75 MCG TOTAL) DAILY 90 tablet 3  . MAGNESIUM PO Take 1 tablet by mouth daily.    Marland Kitchen OVER THE COUNTER MEDICATION Take 1 capsule by mouth at bedtime. OTC SLEEP AID    . sertraline (ZOLOFT) 25 MG tablet Take 1 tablet (25 mg total) by mouth daily. 90 tablet 1   No current facility-administered medications for this visit.      Objective:  BP (!) 170/99 (BP Location: Left Arm, Patient Position: Sitting, Cuff Size: Normal)   Pulse 71   Temp 98 F (36.7 C) (Oral)   Ht 5\' 5"  (1.651 m)   Wt 132 lb 8 oz (60.1 kg)   LMP  (LMP Unknown)   BMI 22.05 kg/m     Assessment and Plan   #Elevated blood pressure reading S: Patient states she has had pretty intense stress today and in last 2 days in addition to covid 19- she is staying in home as much as possible- groceries  being delivered- unfortunately yesterday they couldn't find her big order and they couldn't find it. [employer] only delivered half order of mulch.    134/79 two days ago.  BP Readings from Last 3 Encounters:  12/28/18 (!) 170/99  09/28/18 (!) 144/72  09/10/18 120/76  A/P: I asked patient to give me an update in 3 days with how her blood pressures are doing at home to make sure this trends back down.  Typically is very well controlled without medication so I do not want to rush in any medication introductions-was trending up last visit but was  in the middle of a URI. Addendum- BP much improved when checked later in week- had BP cuff upside down BP 137/60 Comment: self reported  Pulse 71   Temp 98 F (36.7 C) (Oral)   Ht 5\' 5"  (1.651 m)   Wt 132 lb 8 oz (60.1 kg)   LMP  (LMP Unknown)   BMI 22.05 kg/m     GAD (generalized anxiety disorder) S: Unfortunately patient has had much increased anxiety lately with COVID-19 and other issues-see elevated blood pressure discussion.  She has increased her alprazolam usage to approximately 5 pills a day. A/P: Poor control of anxiety-we have been trying to wean the Xanax down but unfortunately she is actually increased usage. - I think she needs better baseline control and we will start Zoloft 25 mg -6-8  Weeks follow up- possibly by phone or in person -We discussed the role of exercise in helping with anxiety-Get out and walk more - I recommended 4 pills maximum per day on the alprazolam-she agrees to comply.   Lab/Order associations: GAD (generalized anxiety disorder)  Elevated blood pressure reading  Meds ordered this encounter  Medications  . ALPRAZolam (XANAX) 0.25 MG tablet    Sig: Take 1-2 tablets (0.25-0.5 mg total) by mouth 2 (two) times daily as needed for anxiety.    Dispense:  120 tablet    Refill:  3  . sertraline (ZOLOFT) 25 MG tablet    Sig: Take 1 tablet (25 mg total) by mouth daily.    Dispense:  90 tablet    Refill:  1    Return precautions advised.  Garret Reddish, MD

## 2018-12-28 NOTE — Patient Instructions (Signed)
There are no preventive care reminders to display for this patient.  Depression screen [facility] 2/9 09/10/2018 06/01/2018 10/09/2015  Decreased Interest 0 0 0  Down, Depressed, Hopeless 0 0 1  PHQ - 2 Score 0 0 1

## 2018-12-28 NOTE — Telephone Encounter (Signed)
Done

## 2018-12-28 NOTE — Telephone Encounter (Signed)
Pt has been scheduled for f/u appt. Today for refill.

## 2018-12-28 NOTE — Assessment & Plan Note (Signed)
S: Unfortunately patient has had much increased anxiety lately with COVID-19 and other issues-see elevated blood pressure discussion.  She has increased her alprazolam usage to approximately 5 pills a day. A/P: Poor control of anxiety-we have been trying to wean the Xanax down but unfortunately she is actually increased usage. - I think she needs better baseline control and we will start Zoloft 25 mg -6-8  Weeks follow up- possibly by phone or in person -We discussed the role of exercise in helping with anxiety-Get out and walk more - I recommended 4 pills maximum per day on the alprazolam-she agrees to comply.

## 2018-12-31 ENCOUNTER — Telehealth: Payer: Self-pay | Admitting: Family Medicine

## 2018-12-31 NOTE — Telephone Encounter (Signed)
Noted  

## 2018-12-31 NOTE — Telephone Encounter (Signed)
See note  Copied from [facility] [id number]. Topic: General - Other >> Dec 31, 2018 10:17 AM Virl Axe D wrote: Reason for CRM: Pt stated she gave some BP readings this morning and had her cuff upside down. She redid two readings about 3-4 minutes apart. See below: 114/67 137/60

## 2018-12-31 NOTE — Telephone Encounter (Signed)
Looks much better- thankful for update

## 2018-12-31 NOTE — Progress Notes (Signed)
Spoke to pt and her Bp readings are:  12/29/2018- 148/72 and 142/63 12/30/2018- 145/75 and 120/65 12/31/2018- 176/77 and 189/75 I had pt take bp reading for this am. Pt was advised to wait 8 min (pt was looking for Bp machine heart rate up) before taking it again.

## 2019-02-01 ENCOUNTER — Encounter: Payer: Self-pay | Admitting: Internal Medicine

## 2019-02-12 ENCOUNTER — Other Ambulatory Visit: Payer: Self-pay

## 2019-02-12 ENCOUNTER — Ambulatory Visit (INDEPENDENT_AMBULATORY_CARE_PROVIDER_SITE_OTHER): Payer: Medicare HMO | Admitting: Family Medicine

## 2019-02-12 ENCOUNTER — Encounter: Payer: Self-pay | Admitting: Family Medicine

## 2019-02-12 VITALS — BP 138/80 | HR 70 | Temp 98.0°F | Ht 65.0 in | Wt 135.0 lb

## 2019-02-12 DIAGNOSIS — F411 Generalized anxiety disorder: Secondary | ICD-10-CM | POA: Diagnosis not present

## 2019-02-12 DIAGNOSIS — N3001 Acute cystitis with hematuria: Secondary | ICD-10-CM

## 2019-02-12 DIAGNOSIS — R300 Dysuria: Secondary | ICD-10-CM | POA: Diagnosis not present

## 2019-02-12 LAB — POCT URINALYSIS DIPSTICK
Bilirubin, UA: NEGATIVE
Blood, UA: POSITIVE
Glucose, UA: NEGATIVE
Ketones, UA: NEGATIVE
Nitrite, UA: NEGATIVE
Protein, UA: NEGATIVE
Spec Grav, UA: 1.01 (ref 1.010–1.025)
Urobilinogen, UA: 0.2 E.U./dL
pH, UA: 7 (ref 5.0–8.0)

## 2019-02-12 MED ORDER — CEPHALEXIN 500 MG PO CAPS: 500.0000 mg | ORAL_CAPSULE | Freq: Three times a day (TID) | ORAL | 0 refills | Status: DC

## 2019-02-12 NOTE — Progress Notes (Signed)
Phone [phone]   Subjective:  GLORIE DOWLEN is a 78 y.o. year old very pleasant female patient who presents for/with See problem oriented charting Chief Complaint  Patient presents with  . Urinary Tract Infection   ROS- see below   Past Medical History-  Patient Active Problem List   Diagnosis Date Noted  . GAD (generalized anxiety disorder) 06/01/2018    Priority: Medium  . Hypercholesterolemia 12/10/2011    Priority: Medium  . Hypothyroidism 12/10/2011    Priority: Medium  . Other and unspecified ovarian cyst 10/25/2013    Priority: Low  . Vaginal atrophy 06/03/2013    Priority: Low  . Hx of colonic polyps 12/10/2011    Priority: Low  . 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 ALPRAZolam (XANAX) 0.25 MG tablet Take 1-2 tablets (0.25-0.5 mg total) by mouth 2 (two) times daily as needed for anxiety. 120 tablet 3  . CALCIUM-MAGNESIUM-VITAMIN D PO Take 1 tablet by mouth 2 (two) times daily.    . Cholecalciferol (VITAMIN D) 2000 UNITS tablet Take 2,000 Units by mouth daily.    Marland Kitchen conjugated estrogens (PREMARIN) vaginal cream Place 1 gram vaginally 1-2 times weekly 42.5 g 5  . levothyroxine (SYNTHROID, LEVOTHROID) 75 MCG tablet TAKE 1 TABLET (75 MCG TOTAL) DAILY 90 tablet 3  . MAGNESIUM PO Take 1 tablet by mouth daily.    Marland Kitchen OVER THE COUNTER MEDICATION Take 1 capsule by mouth at bedtime. OTC SLEEP AID    . sertraline (ZOLOFT) 25 MG tablet Take 1 tablet (25 mg total) by mouth daily. 90 tablet 1  . cephALEXin (KEFLEX) 500 MG capsule Take 1 capsule (500 mg total) by mouth 3 (three) times daily for 7 days. 21 capsule 0   No current facility-administered medications for this visit.      Objective:  BP 138/80 (BP Location: Left Arm, Patient Position: Sitting, Cuff Size: Normal)   Pulse 70   Temp 98 F (36.7 C) (Oral)   Ht 5\' 5"  (1.651 m)   Wt 135 lb (61.2 kg)    LMP  (LMP Unknown)   SpO2 98%   BMI 22.47 kg/m  Gen: NAD, resting comfortably CV: RRR no murmurs rubs or gallops Lungs: CTAB no crackles, wheeze, rhonchi Abdomen: soft/nontender/nondistended/normal bowel sounds. No rebound or guarding.  No cva tenderness Ext: no edema Skin: warm, dry Neuro: normal gait and speech No results found for this or any previous visit (from the past 24 hour(s)).     Assessment and Plan    #Concern for UTI S: Patients symptoms started 2 weeks ago.  Complains of dysuria: mild burning; polyuria: yes; nocturia: yes; urgency: yes. Low energy. Some odor to urine.  Symptoms are stable since onset.  ROS- no fever, chills, nausea, vomiting, flank pain. No blood in urine.  A/P: UA concerning for UTI. Will get culture. Empiric treatment with: keflex 500mg  TID (no ckd so not renally dosing) Patient to follow up if new or worsening symptoms or failure to improve.   #GAD S:  covid 19 was hard on her. Doing better recently but still taking Xanax 0.25 mg BID for the most part.  She never started the Zoloft 25 mg A/P: Anxiety is well controlled on Xanax twice a day- I discussed with patient my concern about using Xanax as primary treatment-she was concerned about the side effects of Zoloft and I told her I was more concerned about  the long-term side effects of Xanax-particularly as she gets older.  She is going to try Zoloft and hopefully we can titrate up at next visit  Future Appointments  Date Time Provider [place]  02/15/2019  3:30 PM [facility] PREVISIT [id number] [facility]  02/22/2019 11:00 AM Irene Shipper, MD [facility]   Lab/Order associations:   ICD-10-CM   1. Dysuria  R30.0 POCT urinalysis dipstick    Urine Culture  2. Acute cystitis with hematuria  N30.01   3. GAD (generalized anxiety disorder)  F41.1     Meds ordered this encounter  Medications  . cephALEXin (KEFLEX) 500 MG capsule    Sig: Take 1 capsule (500 mg total) by mouth 3  (three) times daily for 7 days.    Dispense:  21 capsule    Refill:  0   Return precautions advised.  Garret Reddish, MD

## 2019-02-12 NOTE — Patient Instructions (Addendum)
Start antibiotic and take three times a day for 7 days  If you have worsening symptoms please call us over the weekend but I suspect you will be turning the corner by Monday  Consider taking zoloft to see if you tolerate it with long term goal to try to cut down on the alprazolam

## 2019-02-13 NOTE — Assessment & Plan Note (Signed)
S:  covid 19 was hard on her. Doing better recently but still taking Xanax 0.25 mg BID for the most part.  She never started the Zoloft 25 mg A/P: Anxiety is well controlled on Xanax twice a day- I discussed with patient my concern about using Xanax as primary treatment-she was concerned about the side effects of Zoloft and I told her I was more concerned about the long-term side effects of Xanax-particularly as she gets older.  She is going to try Zoloft and hopefully we can titrate up at next visit

## 2019-02-14 LAB — URINE CULTURE
MICRO NUMBER:: 564499
SPECIMEN QUALITY:: ADEQUATE

## 2019-02-15 ENCOUNTER — Ambulatory Visit (AMBULATORY_SURGERY_CENTER): Payer: Self-pay | Admitting: *Deleted

## 2019-02-15 ENCOUNTER — Other Ambulatory Visit: Payer: Self-pay

## 2019-02-15 ENCOUNTER — Encounter: Payer: Self-pay | Admitting: Internal Medicine

## 2019-02-15 ENCOUNTER — Ambulatory Visit: Payer: Medicare HMO | Admitting: Family Medicine

## 2019-02-15 VITALS — Ht 65.0 in | Wt 138.0 lb

## 2019-02-15 DIAGNOSIS — Z86010 Personal history of colonic polyps: Secondary | ICD-10-CM

## 2019-02-15 DIAGNOSIS — Z800 Family history of malignant neoplasm of digestive organs: Secondary | ICD-10-CM

## 2019-02-15 MED ORDER — SUPREP BOWEL PREP KIT 17.5-3.13-1.6 GM/177ML PO SOLN: 1.0000 | Freq: Once | ORAL | 0 refills | Status: AC

## 2019-02-15 NOTE — Progress Notes (Signed)
Patient denies any allergies to egg or soy products. Patient denies complications with anesthesia/sedation.  Patient denies oxygen use at home and denies diet medications.   Pt verified name, DOB, address and insurance during PV today. Pt mailed instruction packet to included paper to complete and mail back to [facility] with addressed and stamped envelope, Emmi video, copy of consent form to read and not return, and instructions mailed in packet. PV completed over the phone. Pt encouraged to call with questions or issues after reviewing the packet of information. 

## 2019-02-19 ENCOUNTER — Other Ambulatory Visit: Payer: Self-pay

## 2019-02-19 ENCOUNTER — Telehealth: Payer: Self-pay | Admitting: Internal Medicine

## 2019-02-19 ENCOUNTER — Ambulatory Visit (INDEPENDENT_AMBULATORY_CARE_PROVIDER_SITE_OTHER): Payer: Medicare HMO | Admitting: Family Medicine

## 2019-02-19 ENCOUNTER — Encounter: Payer: Self-pay | Admitting: Family Medicine

## 2019-02-19 VITALS — BP 128/84 | HR 75 | Temp 98.7°F | Ht 65.0 in | Wt 133.2 lb

## 2019-02-19 DIAGNOSIS — M79651 Pain in right thigh: Secondary | ICD-10-CM | POA: Diagnosis not present

## 2019-02-19 NOTE — Progress Notes (Signed)
Phone [phone]   Subjective:  [name] is a 78 y.o. year old very pleasant female patient who presents for/with See problem oriented charting Chief Complaint  Patient presents with  . R LE pain   ROS- No fever, chills, cough, shortness of breath, body aches, sore throat, or loss of taste or smell   Past Medical History-  Patient Active Problem List   Diagnosis Date Noted  . GAD (generalized anxiety disorder) 06/01/2018    Priority: Medium  . Hypercholesterolemia 12/10/2011    Priority: Medium  . Hypothyroidism 12/10/2011    Priority: Medium  . Other and unspecified ovarian cyst 10/25/2013    Priority: Low  . Vaginal atrophy 06/03/2013    Priority: Low  . Hx of colonic polyps 12/10/2011    Priority: Low  . 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 ALPRAZolam (XANAX) 0.25 MG tablet Take 1-2 tablets (0.25-0.5 mg total) by mouth 2 (two) times daily as needed for anxiety. 120 tablet 3  . CALCIUM-MAGNESIUM-VITAMIN D PO Take 1 tablet by mouth 2 (two) times daily.    . Cholecalciferol (VITAMIN D) 2000 UNITS tablet Take 2,000 Units by mouth daily.    Marland Kitchen conjugated estrogens (PREMARIN) vaginal cream Place 1 gram vaginally 1-2 times weekly 42.5 g 5  . levothyroxine (SYNTHROID, LEVOTHROID) 75 MCG tablet TAKE 1 TABLET (75 MCG TOTAL) DAILY 90 tablet 3  . MAGNESIUM PO Take 1 tablet by mouth daily.    Marland Kitchen OVER THE COUNTER MEDICATION Take 1 capsule by mouth at bedtime. [employer] OTC SLEEP AID - 25 mg qhs    . sertraline (ZOLOFT) 25 MG tablet Take 1 tablet (25 mg total) by mouth daily. (Patient not taking: Reported on 02/19/2019) 90 tablet 1   No current facility-administered medications for this visit.      Objective:  BP 128/84   Pulse 75   Temp 98.7 F (37.1 C) (Oral)   Ht 5\' 5"  (1.651 m)   Wt 133 lb 3.2 oz (60.4 kg)   LMP  (LMP Unknown)   SpO2 97%   BMI 22.17  kg/m  Gen: NAD, resting comfortably CV: RRR  Lungs: nonlabored, normal respiratory rate Abdomen: soft/nondistended Ext: no edema Skin: warm, dry Patient with pain along right inner thigh with palpation- worse when she extends lower leg. Normal range of motion at hip. Normal strength lower leg. No pretibial edema. No calf pain. Cannot reproduce pain radiating to foot on exam.     Assessment and Plan   R inner thigh pain S: Sx x 3 weeks. Felt pop in the inside of her right thigh while sleeping and woke her up from sleep. Pain comes and goes. At its worst can get up to 5/10. Usually not bad in AM but longer on her feet it tends to get worse. Sx worse with standing and by the end of the day. Also worse when bending knee to chest. Some relief with full extension. Pain radiates towards but not fully into the groin and down the leg into the R foot. Denies increased warmth or erythema or rash. There is no bruising. She has tried Tylenol with minimal relief. No ice or heat tried yet. Ok laying down as long as she doesn't lay on the right side with left leg on top of right leg.  A/P: 78 year old female with right thigh pain- I strongly doubt DVT but with mom's history of death  at age 73 related to [place] would like to rule this out and we opted for a d-dimer- she understands if this is positive she would need to go to the emergency room over the weekend.  I think the more likely cause of this is a quadricep strain though how pain began suddenly when she was asleep does concern me.  She also has some radiation at times down into the leg and I wonder about nerve related pain- offered course of prednisone which she declines for now-no history of diabetes or prediabetes.  We also discussed possible sports medicine referral-she declines for now.  Future Appointments  Date Time Provider [place]  02/22/2019 11:00 AM Irene Shipper, MD [facility]   Lab/Order associations:   ICD-10-CM   1.  Right thigh pain  M79.651 D-Dimer, Quantitative   Return precautions advised.  Garret Reddish, MD

## 2019-02-19 NOTE — Patient Instructions (Addendum)
Rule out blood clot with d dimer test. If test is positive- I will call you and we will need to chat about going to emergency room.   Best of luck on Monday with colonoscopy!   We discussed possible sports medicine referral or prednisone trial - you will think these over

## 2019-02-19 NOTE — Telephone Encounter (Signed)

## 2019-02-19 NOTE — Telephone Encounter (Signed)
All answers are no. °

## 2019-02-20 LAB — D-DIMER, QUANTITATIVE: D-Dimer, Quant: 0.38 mcg/mL FEU (ref <0.50)

## 2019-02-22 ENCOUNTER — Ambulatory Visit (AMBULATORY_SURGERY_CENTER): Payer: Medicare HMO | Admitting: Internal Medicine

## 2019-02-22 ENCOUNTER — Other Ambulatory Visit: Payer: Self-pay

## 2019-02-22 ENCOUNTER — Encounter: Payer: Self-pay | Admitting: Internal Medicine

## 2019-02-22 VITALS — BP 143/69 | HR 62 | Temp 98.4°F | Resp 16 | Ht 65.0 in | Wt 133.0 lb

## 2019-02-22 DIAGNOSIS — D120 Benign neoplasm of cecum: Secondary | ICD-10-CM

## 2019-02-22 DIAGNOSIS — Z86010 Personal history of colonic polyps: Secondary | ICD-10-CM | POA: Diagnosis not present

## 2019-02-22 DIAGNOSIS — Z800 Family history of malignant neoplasm of digestive organs: Secondary | ICD-10-CM | POA: Diagnosis not present

## 2019-02-22 DIAGNOSIS — E039 Hypothyroidism, unspecified: Secondary | ICD-10-CM | POA: Diagnosis not present

## 2019-02-22 DIAGNOSIS — D124 Benign neoplasm of descending colon: Secondary | ICD-10-CM

## 2019-02-22 DIAGNOSIS — Z1211 Encounter for screening for malignant neoplasm of colon: Secondary | ICD-10-CM | POA: Diagnosis not present

## 2019-02-22 DIAGNOSIS — D122 Benign neoplasm of ascending colon: Secondary | ICD-10-CM

## 2019-02-22 MED ORDER — SODIUM CHLORIDE 0.9 % IV SOLN: 500.0000 mL | Freq: Once | INTRAVENOUS | Status: DC

## 2019-02-22 NOTE — Progress Notes (Signed)
Report to PACU, RN, vss, BBS= Clear.  

## 2019-02-22 NOTE — Patient Instructions (Signed)
YOU HAD AN ENDOSCOPIC PROCEDURE TODAY AT [facility]:   Refer to the procedure report that was given to you for any specific questions about what was found during the examination.  If the procedure report does not answer your questions, please call your gastroenterologist to clarify.  If you requested that your care partner not be given the details of your procedure findings, then the procedure report has been included in a sealed envelope for you to review at your convenience later.  YOU SHOULD EXPECT: Some feelings of bloating in the abdomen. Passage of more gas than usual.  Walking can help get rid of the air that was put into your GI tract during the procedure and reduce the bloating. If you had a lower endoscopy (such as a colonoscopy or flexible sigmoidoscopy) you may notice spotting of blood in your stool or on the toilet paper. If you underwent a bowel prep for your procedure, you may not have a normal bowel movement for a few days.  Please Note:  You might notice some irritation and congestion in your nose or some drainage.  This is from the oxygen used during your procedure.  There is no need for concern and it should clear up in a day or so.  SYMPTOMS TO REPORT IMMEDIATELY:   Following lower endoscopy (colonoscopy or flexible sigmoidoscopy):  Excessive amounts of blood in the stool  Significant tenderness or worsening of abdominal pains  Swelling of the abdomen that is new, acute  Fever of 100F or higher  For urgent or emergent issues, a gastroenterologist can be reached at any hour by calling [phone].   DIET:  We do recommend a small meal at first, but then you may proceed to your regular diet.  Drink plenty of fluids but you should avoid alcoholic beverages for 24 hours.  ACTIVITY:  You should plan to take it easy for the rest of today and you should NOT DRIVE or use heavy machinery until tomorrow (because of the sedation medicines used during the test).     FOLLOW UP: Our staff will call the number listed on your records 48-72 hours following your procedure to check on you and address any questions or concerns that you may have regarding the information given to you following your procedure. If we do not reach you, we will leave a message.  We will attempt to reach you two times.  During this call, we will ask if you have developed any symptoms of COVID 19. If you develop any symptoms (ie: fever, flu-like symptoms, shortness of breath, cough etc.) before then, please call [phone].  If you test positive for Covid 19 in the 2 weeks post procedure, please call and report this information to [place].    If any biopsies were taken you will be contacted by phone or by letter within the next 1-3 weeks.  Please call us at [phone] if you have not heard about the biopsies in 3 weeks.    Await for biopsy results Polyps (handout given) Diverticulosis (handout given) No further Colonoscopy screening recommended  SIGNATURES/CONFIDENTIALITY: You and/or your care partner have signed paperwork which will be entered into your electronic medical record.  These signatures attest to the fact that that the information above on your After Visit Summary has been reviewed and is understood.  Full responsibility of the confidentiality of this discharge information lies with you and/or your care-partner.

## 2019-02-22 NOTE — Op Note (Signed)
[place] Patient Name: [name] Procedure Date: 02/22/2019 10:30 AM MRN: [id number] Endoscopist: Docia Chuck. Henrene Pastor , MD Age: 78 Referring MD:  Date of Birth: Jan 03, 1941 Gender: Female Account #: [id number] Procedure:                Colonoscopy with cold snare polypectomy x 3 Indications:              High risk colon cancer surveillance: Personal                            history of non-advanced adenomas. Previous                            examinations with [name], 2006, 2009. Most                            recent examination [name]. Strong family history                            of colon cancer in mother at age 34 and son age 51 Medicines:                Monitored Anesthesia Care Procedure:                Pre-Anesthesia Assessment:                           - Prior to the procedure, a History and Physical                            was performed, and patient medications and                            allergies were reviewed. The patient's tolerance of                            previous anesthesia was also reviewed. The risks                            and benefits of the procedure and the sedation                            options and risks were discussed with the patient.                            All questions were answered, and informed consent                            was obtained. Prior Anticoagulants: The patient has                            taken no previous anticoagulant or antiplatelet                            agents. ASA Grade Assessment: II - A patient with  mild systemic disease. After reviewing the risks                            and benefits, the patient was deemed in                            satisfactory condition to undergo the procedure.                           After obtaining informed consent, the colonoscope                            was passed under direct vision. Throughout the         procedure, the patient's blood pressure, pulse, and                            oxygen saturations were monitored continuously. The                            Colonoscope was introduced through the anus and                            advanced to the the cecum, identified by                            appendiceal orifice and ileocecal valve. The                            ileocecal valve, appendiceal orifice, and rectum                            were photographed. The quality of the bowel                            preparation was excellent. The colonoscopy was                            performed without difficulty. The patient tolerated                            the procedure well. The bowel preparation used was                            SUPREP via split dose instruction. Scope In: 10:38:29 AM Scope Out: 10:53:11 AM Scope Withdrawal Time: 0 hours 9 minutes 52 seconds  Total Procedure Duration: 0 hours 14 minutes 42 seconds  Findings:                 Three polyps were found in the descending colon,                            ascending colon and cecum. The polyps were 1 to 4  mm in size. These polyps were removed with a cold                            snare. Resection and retrieval were complete.                           Multiple diverticula were found in the sigmoid                            colon.                           There was evidence of prior rectal surgery and                            hypertrophic anal papillae. The exam was otherwise                            without abnormality on direct and retroflexion                            views. Complications:            No immediate complications. Estimated blood loss:                            None. Estimated Blood Loss:     Estimated blood loss: none. Impression:               - Three 1 to 4 mm polyps in the descending colon,                            in the ascending colon and in the  cecum, removed                            with a cold snare. Resected and retrieved.                           - Diverticulosis in the sigmoid colon.                           -There was evidence of prior rectal surgery and                            hypertrophic anal papillae. The examination was                            otherwise normal on direct and retroflexion views. Recommendation:           - Repeat colonoscopy is not recommended for                            surveillance.                           - Patient has a contact number available for  emergencies. The signs and symptoms of potential                            delayed complications were discussed with the                            patient. Return to normal activities tomorrow.                            Written discharge instructions were provided to the                            patient.                           - Resume previous diet.                           - Continue present medications.                           - Await pathology results. Docia Chuck. Henrene Pastor, MD 02/22/2019 11:01:40 AM This report has been signed electronically.

## 2019-02-22 NOTE — Progress Notes (Signed)
Temp-cw  Vital signs-jb

## 2019-02-24 ENCOUNTER — Encounter: Payer: Self-pay | Admitting: Internal Medicine

## 2019-02-24 ENCOUNTER — Telehealth: Payer: Self-pay | Admitting: *Deleted

## 2019-02-24 NOTE — Telephone Encounter (Signed)
Have you developed a fever since your procedure? *no 2.   Have you had an respiratory symptoms (SOB or cough) since your procedure? no  3.   Have you tested positive for COVID 19 since your procedure no  4.   Have you had any family members/close contacts diagnosed with the COVID 19 since your procedure?  no   If yes to any of these questions please route to Joylene John, RN and Alphonsa Gin, Therapist, sports.    Follow up Call-  Call back number 02/22/2019  Post procedure Call Back phone  # [phone]  Permission to leave phone message Yes  Some recent data might be hidden     Patient questions:  Do you have a fever, pain , or abdominal swelling? No. Pain Score  0 *  Have you tolerated food without any problems? Yes.    Have you been able to return to your normal activities? Yes.    Do you have any questions about your discharge instructions: Diet   No. Medications  No. Follow up visit  No.  Do you have questions or concerns about your Care? No.  Actions: * If pain score is 4 or above: No action needed, pain <4.

## 2019-04-08 ENCOUNTER — Other Ambulatory Visit: Payer: Self-pay | Admitting: Obstetrics and Gynecology

## 2019-04-08 DIAGNOSIS — N6002 Solitary cyst of left breast: Secondary | ICD-10-CM

## 2019-04-08 DIAGNOSIS — N644 Mastodynia: Secondary | ICD-10-CM | POA: Diagnosis not present

## 2019-04-13 IMAGING — CT CT ABD-PELV W/ CM
2 of 5 series · 15 of 46 positions shown, 17 images · IV contrast (ISOVUE 300)
Comparison: CT abdomen pelvis 08/20/2014

CLINICAL DATA: Left-sided abdominal pain and flank pain for several
months, some diarrhea, history of prior appendectomy and
hysterectomy

EXAM:
CT ABDOMEN AND PELVIS WITH CONTRAST
TECHNIQUE: Multidetector CT imaging of the abdomen and pelvis was performed
using the standard protocol following bolus administration of
intravenous contrast.
CONTRAST:  100mL [id number] IOPAMIDOL ([id number]-[id number]) INJECTION 61%

[Series 2: abd/pel w · axial · 0.70mm/px · z∈[+885,+1255]mm · 12 of 84 slices shown, 14 images]
[im 5/84  soft-tissue]
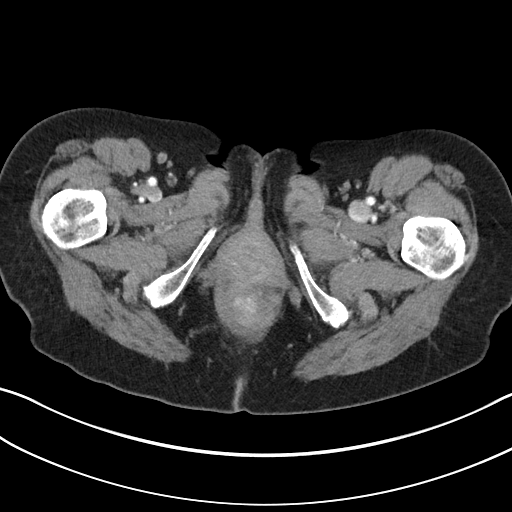
[im 5/84  bone]
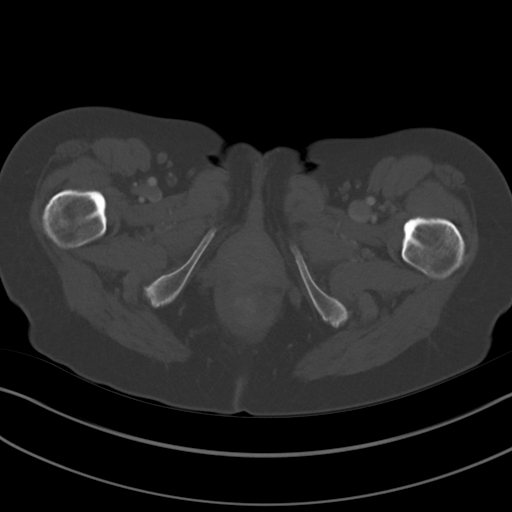
[im 14/84  soft-tissue]
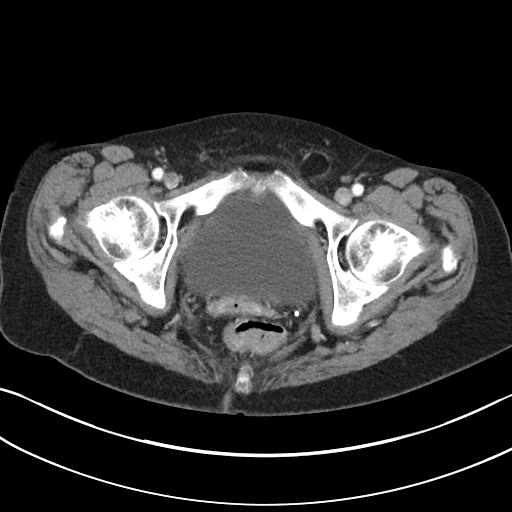
[im 18/84  soft-tissue]
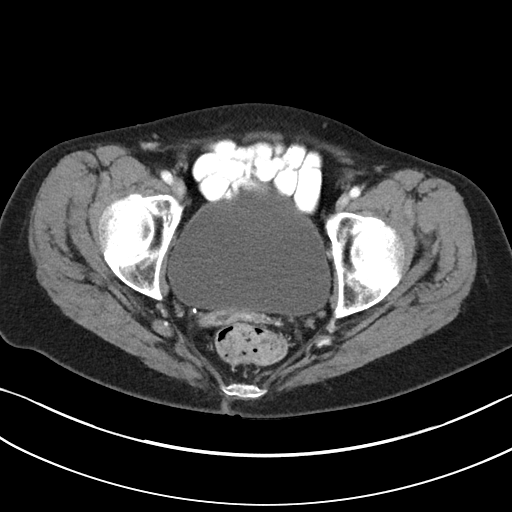
[im 27/84  soft-tissue]
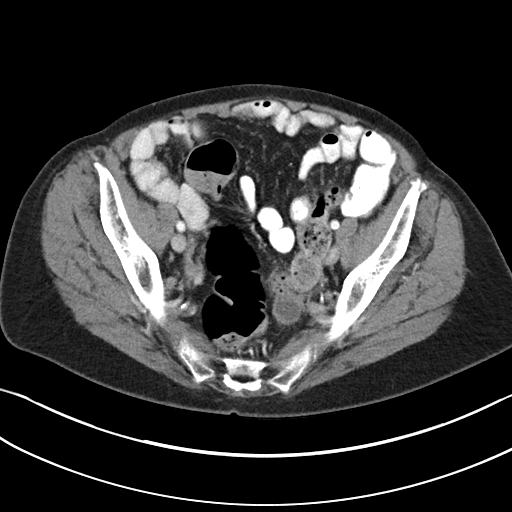
[im 31/84  soft-tissue]
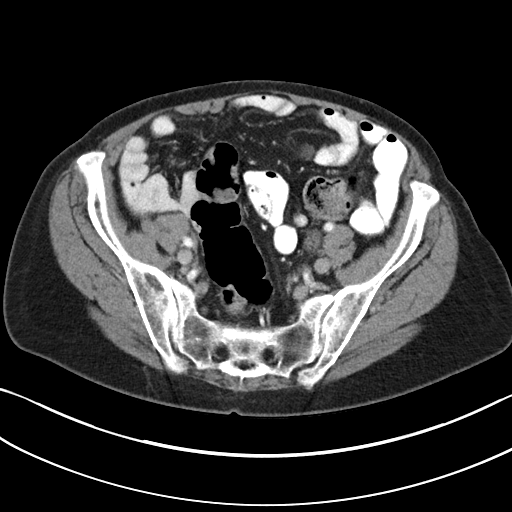
[im 40/84  soft-tissue]
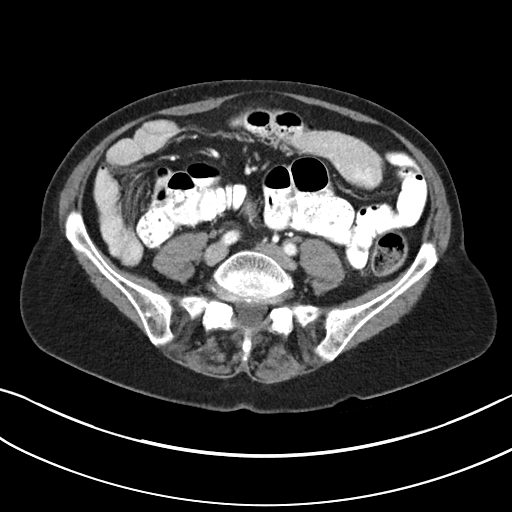
[im 44/84  soft-tissue]
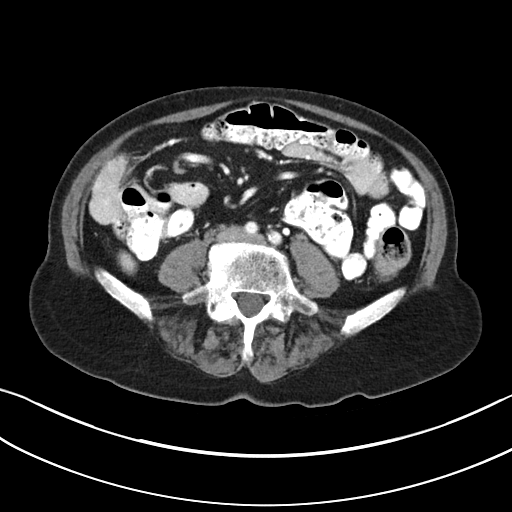
[im 53/84  soft-tissue]
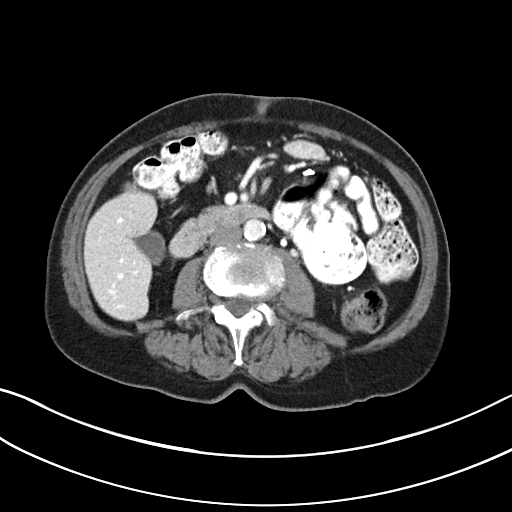
[im 57/84  soft-tissue]
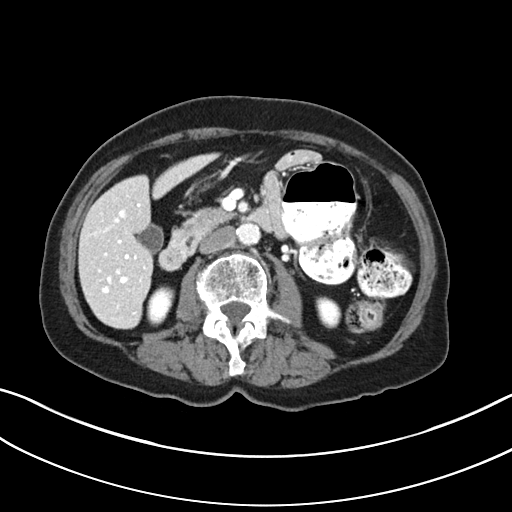
[im 57/84  bone]
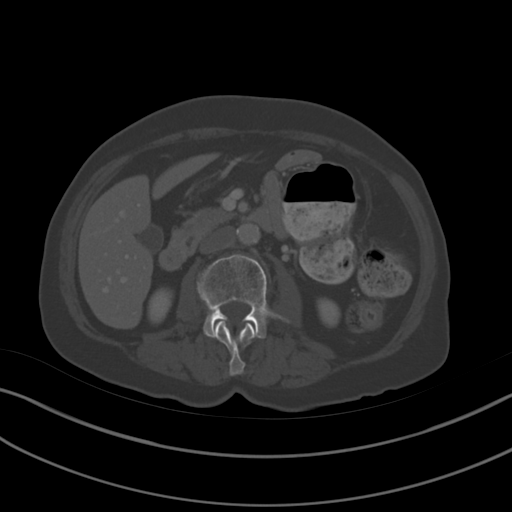
[im 66/84  soft-tissue]
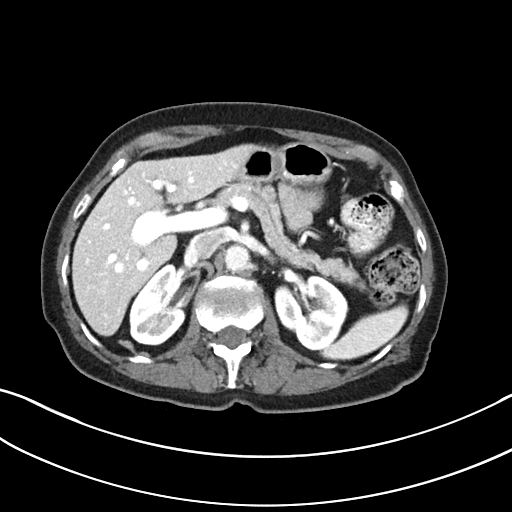
[im 70/84  soft-tissue]
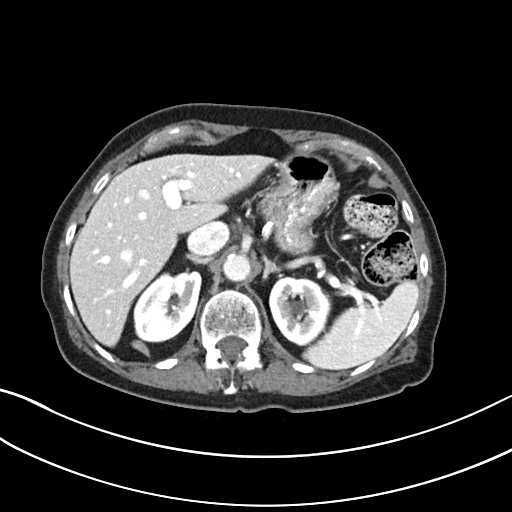
[im 79/84  soft-tissue]
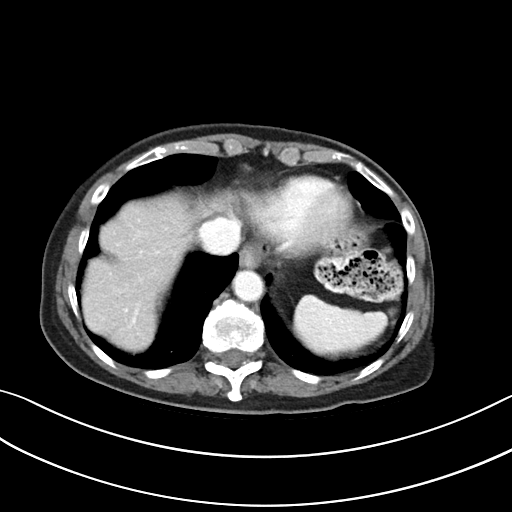

[Series 6: abd/pel w st · coronal · 0.70mm/px · 3 of 86 slices shown]
[im 29/86  soft-tissue]
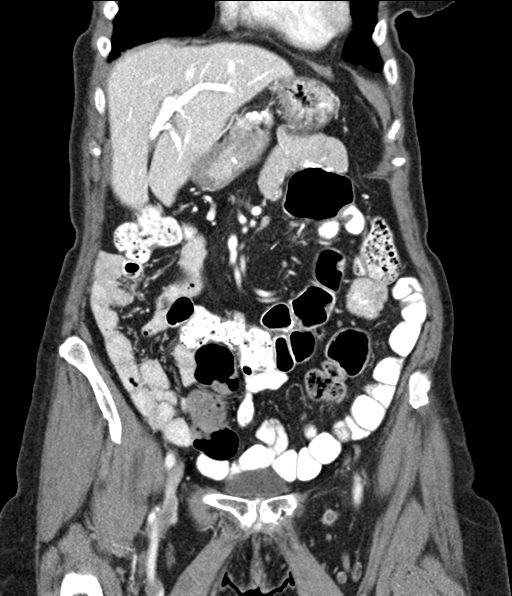
[im 38/86  soft-tissue]
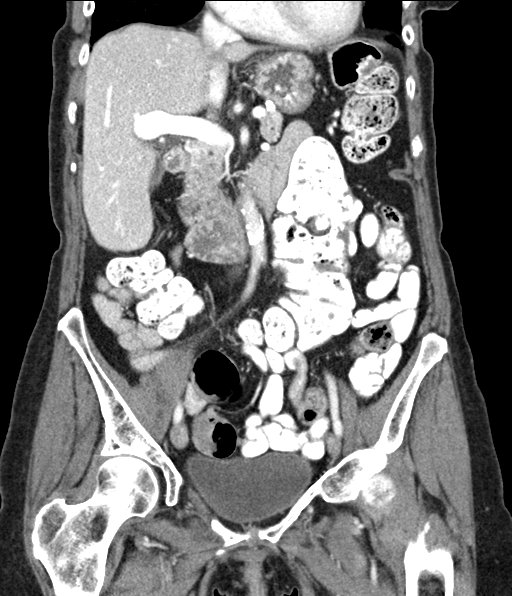
[im 48/86  soft-tissue]
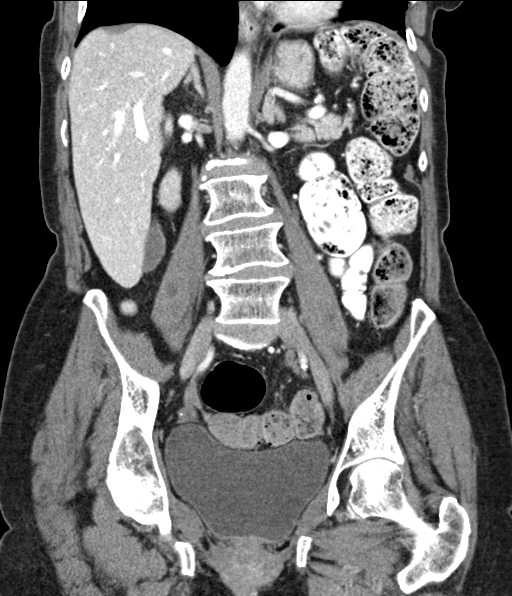

[15 of 46 positions shown; findings below may reference images not displayed]

FINDINGS: Lower chest: The lung bases are clear. The heart is mildly enlarged.
No pericardial effusion is seen. There does appear to be a small
hiatal hernia present.

Hepatobiliary: The liver enhances with no focal abnormality and no
ductal dilatation is seen. No calcified gallstones are noted.

Pancreas: The pancreas is normal in size and the pancreatic duct is
stable. No significant dilatation is seen.

Spleen: The spleen is unremarkable.

Adrenals/Urinary Tract: The adrenal glands are normal. The kidneys
enhance with no calculus or mass. Extrarenal pelves is noted
bilaterally. The ureters appear normal in caliber. The urinary
bladder is moderately well distended with no abnormality noted.

Stomach/Bowel: The stomach is not well distended and again there may
be a small hiatal hernia present. No small bowel dilatation or edema
is seen. A ring of sutures is noted in the rectum which is unchanged
compared to the prior CT. There is a moderate amount of feces
throughout the colon. The colon is somewhat elongated and tortuous.
The cecum is very medially position most likely on a long mesentery.
The cecum actually lies in the left lower abdomen and the terminal
ileum appears normal. The patient has previously undergone
appendectomy.

Vascular/Lymphatic: The abdominal aorta is normal in caliber with
moderate abdominal aortic atherosclerosis present. No adenopathy is
seen.

Reproductive: The uterus has been resected previously. No adnexal
lesion is seen and no fluid is noted within the pelvis. A small
amount air is noted within the vagina.

Other: No abdominal wall hernia is seen.

Musculoskeletal: The lumbar vertebrae are in normal alignment. There
is mild degenerative disc disease at L4-5. Some degenerative changes
also noted in the facet joints of L4-5 and L5-S1. The SI joints
appear corticated.
IMPRESSION: 1. No explanation for the patient's pain is seen and no inflammatory
process is noted. No renal calculi are seen.
2. There is a moderate amount of feces throughout the colon.
3. The cecum presumably is o an long mesentery, being located within
the left lower abdomen. The colon is elongated and tortuous.

## 2019-04-14 ENCOUNTER — Ambulatory Visit
Admission: RE | Admit: 2019-04-14 | Discharge: 2019-04-14 | Disposition: A | Discharge status: Home / Self Care | Payer: Medicare HMO | Source: Ambulatory Visit | Attending: Obstetrics and Gynecology | Admitting: Obstetrics and Gynecology

## 2019-04-14 ENCOUNTER — Other Ambulatory Visit: Payer: Self-pay

## 2019-04-14 DIAGNOSIS — N6489 Other specified disorders of breast: Secondary | ICD-10-CM | POA: Diagnosis not present

## 2019-04-14 DIAGNOSIS — N644 Mastodynia: Secondary | ICD-10-CM

## 2019-04-14 DIAGNOSIS — N6002 Solitary cyst of left breast: Secondary | ICD-10-CM

## 2019-04-14 DIAGNOSIS — R922 Inconclusive mammogram: Secondary | ICD-10-CM | POA: Diagnosis not present

## 2019-04-22 ENCOUNTER — Telehealth: Payer: Self-pay | Admitting: Family Medicine

## 2019-04-22 NOTE — Telephone Encounter (Signed)
I left a message asking the patient to call me at [phone] to schedule AWV with Courtney. VDM (Dee-Dee) °

## 2019-05-11 ENCOUNTER — Telehealth: Payer: Self-pay | Admitting: Family Medicine

## 2019-05-11 NOTE — Telephone Encounter (Signed)
I left a message asking the patient to call me at [phone] to schedule AWV with Loma Sousa after Fridays visit if available.  Im waiting for a call back to either confirm or decline the appointment. VDM (Dee-Dee)

## 2019-05-14 ENCOUNTER — Other Ambulatory Visit: Payer: Self-pay

## 2019-05-14 ENCOUNTER — Encounter: Payer: Self-pay | Admitting: Family Medicine

## 2019-05-14 ENCOUNTER — Ambulatory Visit (INDEPENDENT_AMBULATORY_CARE_PROVIDER_SITE_OTHER): Payer: Medicare HMO | Admitting: Family Medicine

## 2019-05-14 ENCOUNTER — Ambulatory Visit: Payer: Medicare HMO

## 2019-05-14 VITALS — BP 130/70 | HR 71 | Temp 97.7°F | Ht 65.0 in | Wt 137.2 lb

## 2019-05-14 DIAGNOSIS — R030 Elevated blood-pressure reading, without diagnosis of hypertension: Secondary | ICD-10-CM | POA: Diagnosis not present

## 2019-05-14 DIAGNOSIS — E7800 Pure hypercholesterolemia, unspecified: Secondary | ICD-10-CM | POA: Diagnosis not present

## 2019-05-14 DIAGNOSIS — F411 Generalized anxiety disorder: Secondary | ICD-10-CM

## 2019-05-14 DIAGNOSIS — M720 Palmar fascial fibromatosis [Dupuytren]: Secondary | ICD-10-CM

## 2019-05-14 DIAGNOSIS — Z23 Encounter for immunization: Secondary | ICD-10-CM

## 2019-05-14 DIAGNOSIS — E039 Hypothyroidism, unspecified: Secondary | ICD-10-CM | POA: Diagnosis not present

## 2019-05-14 MED ORDER — ALPRAZOLAM 0.25 MG PO TABS: 0.2500 mg | ORAL_TABLET | Freq: Two times a day (BID) | ORAL | 3 refills | Status: DC | PRN

## 2019-05-14 NOTE — Progress Notes (Signed)
Phone [phone]   Subjective:  [name] is a 78 y.o. year old very pleasant female patient who presents for/with See problem oriented charting Chief Complaint  Patient presents with  . Follow-up  . Knot on Hand  . Hypothyroidism  . Hyperlipidemia  . Anxiety  . Hx of Colon Polyp   ROS-no chest pain or shortness of breath reported.  Continued anxiety.  Denies depression since coming off of Zoloft/sertraline  Past Medical History-  Patient Active Problem List   Diagnosis Date Noted  . GAD (generalized anxiety disorder) 06/01/2018    Priority: Medium  . Hypercholesterolemia 12/10/2011    Priority: Medium  . Hypothyroidism 12/10/2011    Priority: Medium  . Other and unspecified ovarian cyst 10/25/2013    Priority: Low  . Vaginal atrophy 06/03/2013    Priority: Low  . Hx of colonic polyps 12/10/2011    Priority: Low  . 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 ALPRAZolam (XANAX) 0.25 MG tablet Take 1-2 tablets (0.25-0.5 mg total) by mouth 2 (two) times daily as needed for anxiety. 120 tablet 3  . CALCIUM-MAGNESIUM-VITAMIN D PO Take 1 tablet by mouth 2 (two) times daily.    . Cholecalciferol (VITAMIN D) 2000 UNITS tablet Take 2,000 Units by mouth daily.    Marland Kitchen conjugated estrogens (PREMARIN) vaginal cream Place 1 gram vaginally 1-2 times weekly 42.5 g 5  . levothyroxine (SYNTHROID, LEVOTHROID) 75 MCG tablet TAKE 1 TABLET (75 MCG TOTAL) DAILY 90 tablet 3  . MAGNESIUM PO Take 1 tablet by mouth daily.    Marland Kitchen OVER THE COUNTER MEDICATION Take 1 capsule by mouth at bedtime. [facility] OTC SLEEP AID - 25 mg qhs     Current Facility-Administered Medications  Medication Dose Route Frequency Provider Last Rate Last Dose  . 0.9 %  sodium chloride infusion  500 mL Intravenous Once Irene Shipper, MD         Objective:  BP 130/70 Comment: on home check  Pulse 71   Temp  97.7 F (36.5 C) (Temporal)   Ht 5\' 5"  (1.651 m)   Wt 137 lb 3.2 oz (62.2 kg)   LMP  (LMP Unknown)   SpO2 97%   BMI 22.83 kg/m  Gen: NAD, resting comfortably CV: RRR no murmurs rubs or gallops Lungs: CTAB no crackles, wheeze, rhonchi Abdomen: soft/nontender/nondistended/normal bowel sounds.  Ext: no edema Skin: warm, dry     Assessment and Plan   # Knot on Hand-right hand S:C/o knot on R palm x 1 month. Not painful. Some tenderness with palpation.   A/P: we explained diagnosis of dupuytrens contracture- monitor only for now as has not started to cause contracture   #hypothyroidism S: On thyroid medication-Levothyroxine 75 mcg daily.   Lab Results  Component Value Date   TSH 0.64 07/09/2018   A/P: hopefully stable- update tsh with next labs  #hyperlipidemia S: poorly controlled on last check  Was walking 2 days a week last year- she wants to restart.  Lab Results  Component Value Date   CHOL 242 (H) 02/23/2018   HDL 52.60 02/23/2018   LDLCALC 79 07/25/2014   LDLDIRECT 143.0 02/23/2018   TRIG 243.0 (H) 02/23/2018   CHOLHDL 5 02/23/2018   A/P: We reviewed prior 10-year risk of heart attack or stroke over 20%- apparently she is taking cholesterol medicine years ago and would be interested in restart as she never had  side effect- would be willing to try just once or twice a week to start. - restart medicine humana once we get blood work if remains elevated-previously had wanted to work on lifestyle  # GAD S:Stopped sertraline d/t side effects- felt run down and actually caused depression. Taking Alprazolam 0.25 mg usually 1-3 a day.  A/P: Reasonable control on alprazolam 1 to 3 tablets/day -nccsrs reviewed  -Refilled alprazolam -Failed SSRI- noted depression even on low-dose  #Vaginal atrophy-Dr. Matthew Saras GYN prescribes Premarin-she will check to see if it would be reasonable to stop  #Osteopenia-she will check with Dr. Matthew Saras as he did last bone density- whether she  needs a repeat or not she is compliant with vitamin D.  #Whitecoat hypertension S: controlled on home checks-elevated on initial check here in office-recorded home number below  Home blood pressure 130/70s.  BP Readings from Last 3 Encounters:  05/14/19 130/70  02/22/19 (!) 143/69  02/19/19 128/84  A/P: Reasonable control at home-continue to monitor  Recommended follow up: 55-month physical recommended  Lab/Order associations:   ICD-10-CM   1. Acquired hypothyroidism  E03.9 TSH  2. Hypercholesterolemia  E78.00 CBC    Comprehensive metabolic panel    Lipid panel  3. 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 GAD (generalized anxiety disorder)  F41.1   5. 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 Need for influenza vaccination  Z23 Flu Vaccine QUAD High Dose(Fluad)    Meds ordered this encounter  Medications  . ALPRAZolam (XANAX) 0.25 MG tablet    Sig: Take 1-2 tablets (0.25-0.5 mg total) by mouth 2 (two) times daily as needed for anxiety.    Dispense:  120 tablet    Refill:  3   Return precautions advised.  Garret Reddish, MD

## 2019-05-14 NOTE — Addendum Note (Signed)
Addended by: Francis Dowse T on: 05/14/2019 03:12 PM   Modules accepted: Orders

## 2019-05-14 NOTE — Patient Instructions (Addendum)
Health Maintenance Due  Topic Date Due  . INFLUENZA VACCINE - today 04/03/2019   No changes today  Please stop by lab before you go If you do not have mychart- we will call you about results within 5 business days of [place] receiving them.  If you have mychart- we will send your results within 3 business days of [place] receiving them.  If abnormal or we want to clarify a result, we will call or mychart you to make sure you receive the message.  If you have questions or concerns or don't hear within 5-7 days, please send [place] a message or call us.

## 2019-05-15 LAB — COMPREHENSIVE METABOLIC PANEL
AG Ratio: 1.9 (calc) (ref 1.0–2.5)
ALT: 14 U/L (ref 6–29)
AST: 19 U/L (ref 10–35)
Albumin: 4.2 g/dL (ref 3.6–5.1)
Alkaline phosphatase (APISO): 60 U/L (ref 37–153)
BUN: 16 mg/dL (ref 7–25)
CO2: 26 mmol/L (ref 20–32)
Calcium: 9.4 mg/dL (ref 8.6–10.4)
Chloride: 103 mmol/L (ref 98–110)
Creat: 0.92 mg/dL (ref 0.60–0.93)
Globulin: 2.2 g/dL (calc) (ref 1.9–3.7)
Glucose, Bld: 91 mg/dL (ref 65–99)
Potassium: 4.4 mmol/L (ref 3.5–5.3)
Sodium: 138 mmol/L (ref 135–146)
Total Bilirubin: 0.4 mg/dL (ref 0.2–1.2)
Total Protein: 6.4 g/dL (ref 6.1–8.1)

## 2019-05-15 LAB — CBC
HCT: 42.7 % (ref 35.0–45.0)
Hemoglobin: 14.5 g/dL (ref 11.7–15.5)
MCH: 30.9 pg (ref 27.0–33.0)
MCHC: 34 g/dL (ref 32.0–36.0)
MCV: 91 fL (ref 80.0–100.0)
MPV: 10.2 fL (ref 7.5–12.5)
Platelets: 213 10*3/uL (ref 140–400)
RBC: 4.69 10*6/uL (ref 3.80–5.10)
RDW: 12.3 % (ref 11.0–15.0)
WBC: 6.5 10*3/uL (ref 3.8–10.8)

## 2019-05-15 LAB — LIPID PANEL
Cholesterol: 223 mg/dL — ABNORMAL HIGH (ref <200)
HDL: 53 mg/dL (ref >=50)
LDL Cholesterol (Calc): 129 mg/dL (calc) — ABNORMAL HIGH
Non-HDL Cholesterol (Calc): 170 mg/dL (calc) — ABNORMAL HIGH (ref <130)
Total CHOL/HDL Ratio: 4.2 (calc) (ref <5.0)
Triglycerides: 265 mg/dL — ABNORMAL HIGH (ref <150)

## 2019-05-15 LAB — TSH: TSH: 0.51 mIU/L (ref 0.40–4.50)

## 2019-05-19 ENCOUNTER — Other Ambulatory Visit: Payer: Self-pay

## 2019-05-19 MED ORDER — SIMVASTATIN 10 MG PO TABS: 10.0000 mg | ORAL_TABLET | Freq: Every day | ORAL | 3 refills | Status: DC

## 2019-05-28 DIAGNOSIS — Z0100 Encounter for examination of eyes and vision without abnormal findings: Secondary | ICD-10-CM | POA: Diagnosis not present

## 2019-05-28 DIAGNOSIS — H524 Presbyopia: Secondary | ICD-10-CM | POA: Diagnosis not present

## 2019-09-22 ENCOUNTER — Telehealth: Payer: Self-pay | Admitting: Family Medicine

## 2019-09-22 NOTE — Telephone Encounter (Signed)
MEDICATION: [name]) 0.25 MG tablet  [name], [name]  Comments: simvastatin (ZOCOR) 10 MG tablet states that it has been making her feel "crazy" and not herself and asked if we could switch it to something else. Also wanted to know Cholesteoff plus advantages and disadvantages or even if it can be approved for her to take    **Let patient know to contact pharmacy at the end of the day to make sure medication is ready. **  ** Please notify patient to allow 48-72 hours to process**  **Encourage patient to contact the pharmacy for refills or they can request refills through [facility]**

## 2019-09-23 NOTE — Telephone Encounter (Signed)
Called [name] back and asked if she could do a virtual visit for Monday but she says that she does not have internet and does not feel comfortable to come in office, asked if we could do a telephone visit.

## 2019-09-23 NOTE — Telephone Encounter (Signed)
Yes, a telephone visit is fine

## 2019-09-23 NOTE — Telephone Encounter (Signed)
Is it okay if we get patient scheduled for a same day slot, next available is not until February

## 2019-09-23 NOTE — Telephone Encounter (Signed)
Please call patent to set up for virtual or office visit if no covid symptoms to talk about med change.

## 2019-09-27 ENCOUNTER — Encounter: Payer: Self-pay | Admitting: Family Medicine

## 2019-09-27 ENCOUNTER — Ambulatory Visit (INDEPENDENT_AMBULATORY_CARE_PROVIDER_SITE_OTHER): Payer: Medicare HMO | Admitting: Family Medicine

## 2019-09-27 VITALS — BP 126/62 | HR 82 | Temp 96.5°F | Wt 135.4 lb

## 2019-09-27 DIAGNOSIS — E7800 Pure hypercholesterolemia, unspecified: Secondary | ICD-10-CM

## 2019-09-27 NOTE — Progress Notes (Signed)
Phone [phone] Virtual visit via phonenote   Subjective:   Chief Complaint  Patient presents with  . virtual  . discuss medication   This visit type was conducted due to national recommendations for restrictions regarding the COVID-19 Pandemic (e.g. social distancing).  This format is felt to be most appropriate for this patient at this time balancing risks to patient and risks to population by having him in for in person visit.  All issues noted in this document were discussed and addressed.  No physical exam was performed (except for noted visual exam or audio findings with Telehealth visits).  The patient has consented to conduct a Telehealth visit and understands insurance will be billed.   Our team/I connected with Joylene Grapes at  3:00 PM EST by phone (patient did not have equipment for webex) and verified that I am speaking with the correct person using two identifiers.  Location patient: [facility] Location provider: [facility], office Persons participating in the virtual visit:  patient  Time on phone: 8 minutes Counseling provided about statin medication  Our team/I discussed the limitations of evaluation and management by telemedicine and the availability of in person appointments. In light of current covid-19 pandemic, patient also understands that we are trying to protect them by minimizing in office contact if at all possible.  The patient expressed consent for telemedicine visit and agreed to proceed. Patient understands insurance will be billed.   Past Medical History-  Patient Active Problem List   Diagnosis Date Noted  . GAD (generalized anxiety disorder) 06/01/2018    Priority: Medium  . Hypercholesterolemia 12/10/2011    Priority: Medium  . Hypothyroidism 12/10/2011    Priority: Medium  . Other and unspecified ovarian cyst 10/25/2013    Priority: Low  . Vaginal atrophy 06/03/2013    Priority: Low  . Hx of colonic polyps 12/10/2011    Priority: Low  .  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 ALPRAZolam (XANAX) 0.25 MG tablet Take 1-2 tablets (0.25-0.5 mg total) by mouth 2 (two) times daily as needed for anxiety. 120 tablet 3  . CALCIUM-MAGNESIUM-VITAMIN D PO Take 1 tablet by mouth 2 (two) times daily.    . Cholecalciferol (VITAMIN D) 2000 UNITS tablet Take 2,000 Units by mouth daily.    Marland Kitchen conjugated estrogens (PREMARIN) vaginal cream Place 1 gram vaginally 1-2 times weekly 42.5 g 5  . levothyroxine (SYNTHROID, LEVOTHROID) 75 MCG tablet TAKE 1 TABLET (75 MCG TOTAL) DAILY 90 tablet 3  . MAGNESIUM PO Take 1 tablet by mouth daily.    Marland Kitchen OVER THE COUNTER MEDICATION Take 1 capsule by mouth at bedtime. [facility] OTC SLEEP AID - 25 mg qhs    . simvastatin (ZOCOR) 10 MG tablet Take 1 tablet (10 mg total) by mouth at bedtime. 90 tablet 3   Current Facility-Administered Medications  Medication Dose Route Frequency Provider Last Rate Last Admin  . 0.9 %  sodium chloride infusion  500 mL Intravenous Once Irene Shipper, MD         Objective:  BP 126/62   Pulse 82   Temp (!) 96.5 F (35.8 C)   Wt 135 lb 6.4 oz (61.4 kg)   LMP  (LMP Unknown)   BMI 22.53 kg/m  self reported vitals  Nonlabored voice, normal speech      Assessment and Plan   #Hyperlipidemia S:Pt states she stopped taking her cholesterol (  simvastatin) medicine because it made her feel depressed and she was having suicidal thoughts-symptoms resolved off the medication. She states since she has stopped taking it, it has made her feel better.  She wanted to consider less frequent dosing option A/P: Hyperlipidemia-poorly controlled and patient with significant side effects on simvastatin including depressed mood-thankfully that resolved off medication.  I would consider option like rosuvastatin 10 mg once a week-she wants to hold off on doing that right now-we also discussed  since she is reached age 32 current guidelines do not suggest significant increased benefit versus risk analysis  Recommended follow up:  Future Appointments  Date Time Provider [place]  12/13/2019  9:40 AM Yong Channel, Brayton Mars, MD [facility]    Lab/Order associations:   ICD-10-CM   1. Hypercholesterolemia  E78.00    Return precautions advised.  Garret Reddish, MD

## 2019-09-27 NOTE — Telephone Encounter (Signed)
Have her scheduled today at 3.

## 2019-09-27 NOTE — Assessment & Plan Note (Signed)
S:Pt states she stopped taking her cholesterol (simvastatin) medicine because it made her feel depressed and she was having suicidal thoughts-symptoms resolved off the medication. She states since she has stopped taking it, it has made her feel better.  She wanted to consider less frequent dosing option A/P: Hyperlipidemia-poorly controlled and patient with significant side effects on simvastatin including depressed mood-thankfully that resolved off medication.  I would consider option like rosuvastatin 10 mg once a week-she wants to hold off on doing that right now-we also discussed since she is reached age 5 current guidelines do not suggest significant increased benefit versus risk analysis

## 2019-09-27 NOTE — Progress Notes (Deleted)
duplicate

## 2019-09-27 NOTE — Patient Instructions (Signed)
Health Maintenance Due  Topic Date Due  . MAMMOGRAM -will schedule 05/28/2019   Depression screen [facility] 2/9 05/14/2019 09/10/2018 06/01/2018  Decreased Interest 0 0 0  Down, Depressed, Hopeless 1 0 0  PHQ - 2 Score 1 0 0

## 2019-10-15 ENCOUNTER — Other Ambulatory Visit: Payer: Self-pay | Admitting: Family Medicine

## 2019-10-18 NOTE — Telephone Encounter (Signed)
  LAST APPOINTMENT DATE: 09/27/2019   NEXT APPOINTMENT DATE:@4 /08/2020  MEDICATION:levothyroxine (SYNTHROID) 75 MCG tabletALPRAZolam Duanne Moron) 0.25 MG tablet  Bernard, Clarence  **Let patient know to contact pharmacy at the end of the day to make sure medication is ready. **  ** Please notify patient to allow 48-72 hours to process**  **Encourage patient to contact the pharmacy for refills or they can request refills through [facility]**  CLINICAL FILLS OUT ALL BELOW:   LAST REFILL:  QTY:  REFILL DATE:    OTHER COMMENTS:    Okay for refill?  Please advise

## 2019-10-19 ENCOUNTER — Other Ambulatory Visit: Payer: Self-pay

## 2019-10-19 MED ORDER — LEVOTHYROXINE SODIUM 75 MCG PO TABS: ORAL_TABLET | ORAL | 3 refills | Status: DC

## 2019-10-19 NOTE — Addendum Note (Signed)
Addended by: Clyde Lundborg A on: 10/19/2019 09:18 AM   Modules accepted: Orders

## 2019-10-19 NOTE — Telephone Encounter (Signed)
LAST APPOINTMENT DATE: @1 /25/2021  NEXT APPOINTMENT DATE:@4 /08/2020   LAST REFILL: 05/14/2019  QTY: #120 3 rf

## 2019-10-19 NOTE — Telephone Encounter (Signed)
Levothyroxine filled and Xanax sent to Dr. Yong Channel for refill.

## 2019-10-20 NOTE — Telephone Encounter (Signed)
Recommend follow up to see how she is doing on the zoloft

## 2019-10-21 MED ORDER — ALPRAZOLAM 0.25 MG PO TABS: 0.2500 mg | ORAL_TABLET | Freq: Two times a day (BID) | ORAL | 0 refills | Status: DC | PRN

## 2019-10-21 NOTE — Telephone Encounter (Signed)
Requesting refill. Pt is scheduled to see you 12/13/2019.

## 2019-11-08 ENCOUNTER — Telehealth: Payer: Self-pay | Admitting: Family Medicine

## 2019-11-08 NOTE — Telephone Encounter (Signed)
Can we send in new script?

## 2019-11-08 NOTE — Telephone Encounter (Signed)
Yes thanks-may send note to 6 months to mail order.  If fax & sign will work I am okay with that.  You can also load this for me and I will send

## 2019-11-08 NOTE — Telephone Encounter (Signed)
Error

## 2019-11-08 NOTE — Telephone Encounter (Signed)
Pt called stating she wanted her Alprazolam to be sent to the [facility], not the [facility]. Please advise.

## 2019-11-10 MED ORDER — ALPRAZOLAM 0.25 MG PO TABS: 0.2500 mg | ORAL_TABLET | Freq: Two times a day (BID) | ORAL | 1 refills | Status: DC

## 2019-11-10 NOTE — Addendum Note (Signed)
Addended by: Francella Solian on: 11/10/2019 12:34 PM   Modules accepted: Orders

## 2019-11-18 ENCOUNTER — Telehealth: Payer: Self-pay

## 2019-11-18 NOTE — Telephone Encounter (Signed)
Received a fax from [place] [facility] requesting clarification on ALPRAZolam (XANAX) 0.25 MG tablet. Clarification has been faxed to the pharmacy. Confirmation fax has been received.

## 2019-11-19 ENCOUNTER — Telehealth: Payer: Self-pay | Admitting: Family Medicine

## 2019-11-19 NOTE — Telephone Encounter (Signed)
[facility] has two different directions on how often patient should be taking the ALPRAZolam (XANAX) 0.25 MG tablet, asked for a returned call for clarification.

## 2019-11-19 NOTE — Telephone Encounter (Signed)
This is what the directions on the Rx are.  Take 1 tablet (0.25 mg total) by mouth 2 (two) times daily. 1-2 tab bid as needed for anxiety

## 2019-11-22 NOTE — Telephone Encounter (Signed)
Should be-"Take 1-2 tablets (0.25-0.5 mg total) by mouth 2 (two) times daily as needed for anxiety."

## 2019-11-22 NOTE — Telephone Encounter (Signed)
Called and spoke to [place]. Gave clarification on medications. Will call if any further issues.

## 2019-12-10 NOTE — Progress Notes (Signed)
Phone: [phone]   Subjective:  Patient presents today for their annual physical. Chief complaint-noted.   See problem oriented charting- Review of Systems  Constitutional: Negative for chills and fever.  HENT: Negative for ear pain, hearing loss and tinnitus.   Eyes: Negative for blurred vision, double vision and photophobia.  Respiratory: Negative for cough, shortness of breath and wheezing.   Cardiovascular: Positive for chest pain. Negative for palpitations and leg swelling.       Started about a week ago under left breast   Gastrointestinal: Negative for blood in stool, constipation, diarrhea, heartburn, nausea and vomiting.  Genitourinary: Negative for dysuria, frequency and urgency.  Musculoskeletal: Negative for back pain, joint pain and neck pain.  Skin: Negative for itching and rash.  Neurological: Positive for headaches. Negative for dizziness, seizures and weakness.  Endo/Heme/Allergies: Does not bruise/bleed easily.  Psychiatric/Behavioral: Positive for depression. Negative for hallucinations, memory loss, substance abuse and suicidal ideas. The patient does not have insomnia.    The following were reviewed and entered/updated in epic: Past Medical History:  Diagnosis Date  . Anxiety   . Arthritis    neck, back  . Hx of colonoscopy with polypectomy   . Hyperlipidemia    diet controlled  . 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 SVD (spontaneous vaginal delivery)    x 4  . Thyroid disease    Patient Active Problem List   Diagnosis Date Noted  . GAD (generalized anxiety disorder) 06/01/2018    Priority: Medium  . White coat syndrome without hypertension 06/01/2018    Priority: Medium  . Hypercholesterolemia 12/10/2011    Priority: Medium  . Hypothyroidism 12/10/2011    Priority: Medium  . Other and unspecified ovarian cyst 10/25/2013    Priority: Low  . Vaginal atrophy 06/03/2013    Priority: Low  . Hx of colonic polyps 12/10/2011    Priority: Low   Past  Surgical History:  Procedure Laterality Date  . ABDOMINAL HYSTERECTOMY    . APPENDECTOMY    . BREAST SURGERY Right    LUMPECTOMY- FIBROID TUMOR benign  . COLONOSCOPY     hx polyps/ [facility]-mother   . ENTEROCELE REPAIR    . EXCISIONAL HEMORRHOIDECTOMY    . fnger surgery  04/2018  . POLYPECTOMY      Family History  Problem Relation Age of Onset  . Colon cancer Mother 17  . Anxiety disorder Mother   . Deep vein thrombosis Mother        died age 27  . Heart disease Father 12       smoker, high stress job  . Colon cancer Brother 41  . Colon cancer Maternal Aunt 51  . Colon cancer Son 29  . Colon cancer Paternal Grandmother   . Colon cancer Other 32  . Hodgkin's lymphoma Brother 10  . Breast cancer Daughter   . Colon polyps Neg Hx   . Esophageal cancer Neg Hx   . Rectal cancer Neg Hx   . Stomach cancer Neg Hx     Medications- reviewed and updated Current Outpatient Medications  Medication Sig Dispense Refill  . ALPRAZolam (XANAX) 0.25 MG tablet Take 1-2 tablets (0.25-0.5 mg total) by mouth 2 (two) times daily as needed for anxiety. 120 tablet 0  . Cholecalciferol (VITAMIN D) 2000 UNITS tablet Take 2,000 Units by mouth daily.    Marland Kitchen conjugated estrogens (PREMARIN) vaginal cream Place 1 gram vaginally 1-2 times weekly 42.5 g 5  . levothyroxine (SYNTHROID) 75 MCG tablet TAKE  1 TABLET EVERY DAY 90 tablet 3  . MAGNESIUM PO Take 1 tablet by mouth daily.    Marland Kitchen OVER THE COUNTER MEDICATION Take 1 capsule by mouth at bedtime. [employer] OTC SLEEP AID - 25 mg qhs    . CALCIUM-MAGNESIUM-VITAMIN D PO Take 1 tablet by mouth 2 (two) times daily.    Marland Kitchen omeprazole (PRILOSEC) 40 MG capsule Take 1 capsule (40 mg total) by mouth daily. 30 capsule 0   No current facility-administered medications for this visit.    Allergies-reviewed and updated Allergies  Allergen Reactions  . Crab [Shellfish Allergy] Other (See Comments)    Severe stomach pains from crab    Social History   Social History  Narrative   Married 61 years in 2019. 4 children, 3 living. 5 grandkids.    Poodle 12.5 in 2019.       Housewife.    Some college- [employer]      Hobbies: travel, walking, work out in yard   Objective  Objective:  BP (!) 144/82   Pulse 61   Temp 98 F (36.7 C) (Temporal)   Ht 5\' 5"  (1.651 m)   Wt 130 lb (59 kg)   LMP  (LMP Unknown)   SpO2 97%   BMI 21.63 kg/m  Gen: NAD, resting comfortably HEENT: Mucous membranes are moist. Oropharynx normal Neck: no thyromegaly CV: RRR no murmurs rubs or gallops Lungs: CTAB no crackles, wheeze, rhonchi Abdomen: soft/nontender/nondistended/normal bowel sounds. No rebound or guarding.  Ext: no edema Skin: warm, dry Neuro: grossly normal, moves all extremities, PERRLA   Assessment and Plan   79 y.o. female presenting for annual physical.  Health Maintenance counseling: 1. Anticipatory guidance: Patient counseled regarding regular dental exams q6 months, eye exams yearly,  avoiding smoking and second hand smoke , limiting alcohol to 1 beverage per day .  Never drank.  2. Risk factor reduction:  Advised patient of need for regular exercise and diet rich and fruits and vegetables to reduce risk of heart attack and stroke. Exercise- works garden, encouraged to get back to walking (once we have worked up chest pain). Diet- tries to eat healthy. Slight weight loss- told her she can loosen diet up some.  Wt Readings from Last 3 Encounters:  12/13/19 130 lb (59 kg)  09/27/19 135 lb 6.4 oz (61.4 kg)  05/14/19 137 lb 3.2 oz (62.2 kg)  3. Immunizations/screenings/ancillary studies-  Immunization History  Administered Date(s) Administered  . Fluad Quad(high Dose 65+) 05/14/2019  . Influenza, High Dose Seasonal PF 07/25/2014, 05/25/2015, 06/26/2016, 06/17/2017, 06/01/2018  . Influenza,inj,Quad PF,6+ Mos 06/03/2013  . Moderna SARS-COVID-2 Vaccination 09/24/2019, 10/22/2019  . Pneumococcal Conjugate-13 07/25/2014  . Pneumococcal Polysaccharide-23  07/04/2011  . Tetanus 07/19/2013  4. Cervical cancer screening-  passed age based screening recommendations.  5. Breast cancer screening-  Passed age based screening recommendations. mammogram 05/27/18 . Breast cancer in daughter and mother so prefers to do this yearly- she is going to schedule this 6. Colon cancer screening - 02/22/2019 - Dr. Henrene Pastor noted no further colonoscopies  7. Skin cancer screening- no dermatologist this year or last but plans to schedule. advised regular sunscreen use. Denies worrisome, changing, or new skin lesions.  8. Birth control/STD check- Declines  9. Osteoporosis screening at 72- done 06/15/2008 very mild osteopenia - she plans to get updated with gynecology- if hasnt by next physical we will plan on doing this -former  Smoker. Has not smoked after 1980.    See problem oriented note for -  Status of chronic or acute concerns   Recommended follow up: 1 year physical plus 1 month follow up for acute concenrs  Lab/Order associations:not fasting  One cup of coffee.  No physical labs- all problem oriented  Z00.00 for this visit only  Return precautions advised.  Garret Reddish, MD

## 2019-12-13 ENCOUNTER — Other Ambulatory Visit: Payer: Self-pay

## 2019-12-13 ENCOUNTER — Encounter: Payer: Self-pay | Admitting: Family Medicine

## 2019-12-13 ENCOUNTER — Ambulatory Visit (INDEPENDENT_AMBULATORY_CARE_PROVIDER_SITE_OTHER): Payer: Medicare HMO

## 2019-12-13 ENCOUNTER — Ambulatory Visit (INDEPENDENT_AMBULATORY_CARE_PROVIDER_SITE_OTHER): Payer: Medicare HMO | Admitting: Family Medicine

## 2019-12-13 VITALS — BP 144/82 | HR 61 | Temp 98.0°F | Ht 65.0 in | Wt 130.0 lb

## 2019-12-13 DIAGNOSIS — R030 Elevated blood-pressure reading, without diagnosis of hypertension: Secondary | ICD-10-CM

## 2019-12-13 DIAGNOSIS — R0789 Other chest pain: Secondary | ICD-10-CM

## 2019-12-13 DIAGNOSIS — Z0000 Encounter for general adult medical examination without abnormal findings: Secondary | ICD-10-CM

## 2019-12-13 DIAGNOSIS — F411 Generalized anxiety disorder: Secondary | ICD-10-CM

## 2019-12-13 DIAGNOSIS — R319 Hematuria, unspecified: Secondary | ICD-10-CM | POA: Diagnosis not present

## 2019-12-13 DIAGNOSIS — R0602 Shortness of breath: Secondary | ICD-10-CM | POA: Diagnosis not present

## 2019-12-13 DIAGNOSIS — E039 Hypothyroidism, unspecified: Secondary | ICD-10-CM | POA: Diagnosis not present

## 2019-12-13 DIAGNOSIS — Z0001 Encounter for general adult medical examination with abnormal findings: Secondary | ICD-10-CM

## 2019-12-13 DIAGNOSIS — E7800 Pure hypercholesterolemia, unspecified: Secondary | ICD-10-CM | POA: Diagnosis not present

## 2019-12-13 LAB — CBC WITH DIFFERENTIAL/PLATELET
Basophils Absolute: 0 10*3/uL (ref 0.0–0.1)
Basophils Relative: 0.6 % (ref 0.0–3.0)
Eosinophils Absolute: 0.1 10*3/uL (ref 0.0–0.7)
Eosinophils Relative: 1.4 % (ref 0.0–5.0)
HCT: 44.2 % (ref 36.0–46.0)
Hemoglobin: 14.8 g/dL (ref 12.0–15.0)
Lymphocytes Relative: 23.5 % (ref 12.0–46.0)
Lymphs Abs: 1.4 10*3/uL (ref 0.7–4.0)
MCHC: 33.6 g/dL (ref 30.0–36.0)
MCV: 91.8 fl (ref 78.0–100.0)
Monocytes Absolute: 0.4 10*3/uL (ref 0.1–1.0)
Monocytes Relative: 6.5 % (ref 3.0–12.0)
Neutro Abs: 4.1 10*3/uL (ref 1.4–7.7)
Neutrophils Relative %: 68 % (ref 43.0–77.0)
Platelets: 203 10*3/uL (ref 150.0–400.0)
RBC: 4.81 Mil/uL (ref 3.87–5.11)
RDW: 12.8 % (ref 11.5–15.5)
WBC: 6 10*3/uL (ref 4.0–10.5)

## 2019-12-13 LAB — COMPREHENSIVE METABOLIC PANEL
ALT: 15 U/L (ref 0–35)
AST: 23 U/L (ref 0–37)
Albumin: 4.4 g/dL (ref 3.5–5.2)
Alkaline Phosphatase: 58 U/L (ref 39–117)
BUN: 11 mg/dL (ref 6–23)
CO2: 31 mEq/L (ref 19–32)
Calcium: 9.3 mg/dL (ref 8.4–10.5)
Chloride: 101 mEq/L (ref 96–112)
Creatinine, Ser: 0.85 mg/dL (ref 0.40–1.20)
GFR: 64.31 mL/min (ref >60.00)
Glucose, Bld: 88 mg/dL (ref 70–99)
Potassium: 4.2 mEq/L (ref 3.5–5.1)
Sodium: 138 mEq/L (ref 135–145)
Total Bilirubin: 0.6 mg/dL (ref 0.2–1.2)
Total Protein: 6.8 g/dL (ref 6.0–8.3)

## 2019-12-13 LAB — LIPID PANEL
Cholesterol: 225 mg/dL — ABNORMAL HIGH (ref 0–200)
HDL: 49.1 mg/dL (ref >39.00)
LDL Cholesterol: 152 mg/dL — ABNORMAL HIGH (ref 0–99)
NonHDL: 175.4
Total CHOL/HDL Ratio: 5
Triglycerides: 115 mg/dL (ref 0.0–149.0)
VLDL: 23 mg/dL (ref 0.0–40.0)

## 2019-12-13 LAB — URINALYSIS, MICROSCOPIC ONLY

## 2019-12-13 LAB — TROPONIN I (HIGH SENSITIVITY): High Sens Troponin I: 5 ng/L (ref 2–17)

## 2019-12-13 LAB — TSH: TSH: 0.35 u[IU]/mL (ref 0.35–4.50)

## 2019-12-13 MED ORDER — OMEPRAZOLE 40 MG PO CPDR: 40.0000 mg | DELAYED_RELEASE_CAPSULE | Freq: Every day | ORAL | 0 refills | Status: DC

## 2019-12-13 NOTE — Assessment & Plan Note (Signed)
#   GAD/depression S:Stopped sertraline 25mg  d/t side effects- felt run down and actually caused depression. Taking Alprazolam 0.25 mg usually 1-3 a day. Has noticed that she has had increased symptoms in the last two weeks. Denies any change in life or stress that would increase symptoms.   Stress higher lately- worries about her husband's health and daughters dog has been sick.  Depression screen [facility] 2/9 12/13/2019 09/27/2019 05/14/2019  Decreased Interest 2 0 0  Down, Depressed, Hopeless 3 0 1  PHQ - 2 Score 5 0 1  Altered sleeping 2 - -  Tired, decreased energy 3 - -  Change in appetite 0 - -  Feeling bad or failure about yourself  0 - -  Trouble concentrating 1 - -  Moving slowly or fidgety/restless 0 - -  Suicidal thoughts 0 - -  PHQ-9 Score 11 - -  Difficult doing work/chores Somewhat difficult - -  A/P: poor control of anxiety despite xanax several times a day. I Still strongly prefer SSRI and cutting down on xanax but I do not want to make too many adjustments at once  From avs "Please call [phone] to schedule a visit with [place] [facility] -Trey Paula is an excellent counselor who is based out of our clinic  Also consider option like lexapro but with starting omeprazole and aspirin we didn't want to start too many medicines at one time due to prior side effects you have had"

## 2019-12-13 NOTE — Progress Notes (Signed)
Phone [phone] In person visit   Subjective:   [name] is a 79 y.o. year old very pleasant female patient who presents for/with See problem oriented charting Chief Complaint  Patient presents with  . Anxiety  . Hypothyroidism   This visit occurred during the SARS-CoV-2 public health emergency.  Safety protocols were in place, including screening questions prior to the visit, additional usage of staff PPE, and extensive cleaning of exam room while observing appropriate contact time as indicated for disinfecting solutions.   Past Medical History-  Patient Active Problem List   Diagnosis Date Noted  . GAD (generalized anxiety disorder) 06/01/2018    Priority: Medium  . White coat syndrome without hypertension 06/01/2018    Priority: Medium  . Hypercholesterolemia 12/10/2011    Priority: Medium  . Hypothyroidism 12/10/2011    Priority: Medium  . Other and unspecified ovarian cyst 10/25/2013    Priority: Low  . Vaginal atrophy 06/03/2013    Priority: Low  . Hx of colonic polyps 12/10/2011    Priority: Low    Medications- reviewed and updated Current Outpatient Medications  Medication Sig Dispense Refill  . ALPRAZolam (XANAX) 0.25 MG tablet Take 1-2 tablets (0.25-0.5 mg total) by mouth 2 (two) times daily as needed for anxiety. 120 tablet 0  . Cholecalciferol (VITAMIN D) 2000 UNITS tablet Take 2,000 Units by mouth daily.    Marland Kitchen conjugated estrogens (PREMARIN) vaginal cream Place 1 gram vaginally 1-2 times weekly 42.5 g 5  . levothyroxine (SYNTHROID) 75 MCG tablet TAKE 1 TABLET EVERY DAY 90 tablet 3  . MAGNESIUM PO Take 1 tablet by mouth daily.    Marland Kitchen OVER THE COUNTER MEDICATION Take 1 capsule by mouth at bedtime. [employer] OTC SLEEP AID - 25 mg qhs    . CALCIUM-MAGNESIUM-VITAMIN D PO Take 1 tablet by mouth 2 (two) times daily.    Marland Kitchen omeprazole (PRILOSEC) 40 MG capsule Take 1 capsule (40 mg total) by mouth daily. 30 capsule 0   No current facility-administered medications  for this visit.     Objective:  BP (!) 144/82   Pulse 61   Temp 98 F (36.7 C) (Temporal)   Ht 5\' 5"  (1.651 m)   Wt 130 lb (59 kg)   LMP  (LMP Unknown)   SpO2 97%   BMI 21.63 kg/m  No chest wall tenderness  EKG: Sinus rhythm with rate 68, normal axis, normal intervals, no hypertrophy, no st or t wave changes    Assessment and Plan    # Chest pain S: patient started with left lower rib pain about 10 days ago. No fall or injury. No shortness of breath or left arm or neck pain. Mild dizziness. Pain at its worst 4/10. Can last most of the day. Does not get worse with exertion such as stairs or walking down hall. Might be worse when she eats a good size meal- otherwise she is not sure what makes pain better or worse. Has not tried any medicine yet.   Also gets some upper chest pain with the lower rib pain. That has resolved as of yesterday and this morning- also the left lower rib pain is better.  A/P: 79 year old female with high cholesterol and white coat hypertension history now possibly with true hypertension with atypical left sided chest pain  - we will get EKG as well as chest x-ray - update bloodwork including troponin to rule out heart attack -we will also go ahead and refer her to cardiology  due to risk factors- even if all the tests above are normal does not firmly rule out heart disease - I think this could be acid reflux though and I want her to trial omeprazole 40mg  daily for one month then can try 20mg  daily over the counter after that and see me back in about 2 months -if she has new or worsening symptoms should go to the emergency room/call 911 -could be anxiety related as well- see below - will have her start aspirin 81 mg until we get results back  # GAD/depression S:Stopped sertraline 25mg  d/t side effects- felt run down and actually caused depression. Taking Alprazolam 0.25 mg usually 1-3 a day. Has noticed that she has had increased symptoms in the last two weeks.  Denies any change in life or stress that would increase symptoms.   Stress higher lately- worries about her husband's health and daughters dog has been sick.  Depression screen [facility] 2/9 12/13/2019 09/27/2019 05/14/2019  Decreased Interest 2 0 0  Down, Depressed, Hopeless 3 0 1  PHQ - 2 Score 5 0 1  Altered sleeping 2 - -  Tired, decreased energy 3 - -  Change in appetite 0 - -  Feeling bad or failure about yourself  0 - -  Trouble concentrating 1 - -  Moving slowly or fidgety/restless 0 - -  Suicidal thoughts 0 - -  PHQ-9 Score 11 - -  Difficult doing work/chores Somewhat difficult - -  A/P: poor control of anxiety despite xanax several times a day. I Still strongly prefer SSRI and cutting down on xanax but I do not want to make too many adjustments at once  From avs "Please call [phone] to schedule a visit with [place] [facility] -Trey Paula is an excellent counselor who is based out of our clinic  Also consider option like lexapro but with starting omeprazole and aspirin we didn't want to start too many medicines at one time due to prior side effects you have had"   occasional mild headache when anxiety high- we will monitor but if this worsens she will let me know  # Whitecoat hypertension S: Home #s typically 140s/70s with HR in 73. States this has been trending up over time- feels like stress could contribute BP Readings from Last 3 Encounters:  12/13/19 (!) 144/82  09/27/19 126/62  05/14/19 130/70  A/P: poor control of blood pressure today- we agreed to follow up in about a month- bring home cuff and readings from the last month with you so we can reevaluate   # hypothyroidism S:On thyroid medication-Levothyroxine 75 mcg daily. Lab Results  Component Value Date   TSH 0.51 05/14/2019  A/P:  Hopefully stable- update tsh today    # hyperlipidemia S:Patient was on Zocor in the past but had to stop due to side effects- depressed mood on simvastatin 10 mg daily Lab  Results  Component Value Date   CHOL 223 (H) 05/14/2019   HDL 53 05/14/2019   LDLCALC 129 (H) 05/14/2019   LDLDIRECT 143.0 02/23/2018   TRIG 265 (H) 05/14/2019   CHOLHDL 4.2 05/14/2019  A/P: I really would like to start her on something for cholesterol particularly with upcoming heart workup- that being said she has had multiple side effects to meds- she prefers to just start reflux medicine and aspirin for now- if things are stable at 1 month follow up consider rosuvastatin 5 mg even just once a week due to side effect concern   #hematuria -  states she has had extensive workup in past- looks like last urology visit was 2016. She now has some weight loss as well. We were going to check urine microscopic and culture and if still with hematuria consider urology consultation again.   Recommended follow up: 1 month follow up or sooner if needed  Lab/Order associations:   ICD-10-CM   2. Atypical chest pain  R07.89 EKG 12-Lead    Troponin I (High Sensitivity)    DG Chest 2 View    Ambulatory referral to Cardiology  3. Acquired hypothyroidism  E03.9 TSH  4. Hypercholesterolemia  E78.00 CBC with Differential/Platelet    Comprehensive metabolic panel    Lipid panel  5. GAD (generalized anxiety disorder)  F41.1   6. 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 omeprazole (PRILOSEC) 40 MG capsule    Sig: Take 1 capsule (40 mg total) by mouth daily.    Dispense:  30 capsule    Refill:  0   Return precautions advised.  Garret Reddish, MD

## 2019-12-13 NOTE — Assessment & Plan Note (Signed)
S:Patient was on Zocor in the past but had to stop due to side effects- depressed mood on simvastatin 10 mg daily Lab Results  Component Value Date   CHOL 223 (H) 05/14/2019   HDL 53 05/14/2019   LDLCALC 129 (H) 05/14/2019   LDLDIRECT 143.0 02/23/2018   TRIG 265 (H) 05/14/2019   CHOLHDL 4.2 05/14/2019  A/P: I really would like to start her on something for cholesterol particularly with upcoming heart workup- that being said she has had multiple side effects to meds- she prefers to just start reflux medicine and aspirin for now- if things are stable at 1 month follow up consider rosuvastatin 5 mg even just once a week due to side effect concern

## 2019-12-13 NOTE — Patient Instructions (Addendum)
Health Maintenance Due  Topic Date Due  . MAMMOGRAM  Due will get with Dr. Matthew Saras  05/28/2019   Please stop by lab and x-ray before you go If you do not have mychart- we will call you about results within 5 business days of [place] receiving them.  If you have mychart- we will send your results within 3 business days of [place] receiving them.  If abnormal or we want to clarify a result, we will call or mychart you to make sure you receive the message.  If you have questions or concerns or don't hear within 5 business days, please send [place] a message or call us.    For chest pain  - we will get EKG as well as chest x-ray-EKG looks pretty good - update bloodwork including troponin to rule out heart attack -we will also go ahead and refer her to cardiology due to risk factors- even if all the tests above are normal does not firmly rule out heart disease - I think this could be acid reflux though and I want her to trial omeprazole 40mg  daily for one month then can try 20mg  daily over the counter after that and see me back in about 2 months -if she has new or worsening symptoms should go to the emergency room/call 911 - will have her start aspirin 81 mg until we get results back   poor control of blood pressure today- we agreed to follow up in about a month- bring home cuff and readings from the last month with you so we can reevaluate.   I really would like to start her on something for cholesterol particularly with upcoming heart workup- that being said she has had multiple side effects to meds- she prefers to just start reflux medicine and aspirin for now- if things are stable at 1 month follow up consider rosuvastatin 5 mg even just once a week due to side effect concern   For depression/anxiety Please call [phone] to schedule a visit with [facility] -Trey Paula is an excellent counselor who is based out of our clinic  Also consider option like lexapro but with starting  omeprazole and aspirin we didn't want to start too many medicines at one time due to prior side effects you have had  Once things settle down- lets have you consider shingrix for shingles vaccination- that would be at your pharmacy

## 2019-12-13 NOTE — Assessment & Plan Note (Signed)
S:On thyroid medication-Levothyroxine 75 mcg daily. Lab Results  Component Value Date   TSH 0.51 05/14/2019  A/P:  Hopefully stable- update tsh today

## 2019-12-13 NOTE — Assessment & Plan Note (Signed)
S: Home #s typically 140s/70s with HR in 73. States this has been trending up over time- feels like stress could contribute BP Readings from Last 3 Encounters:  12/13/19 (!) 144/82  09/27/19 126/62  05/14/19 130/70  A/P: poor control of blood pressure today- we agreed to follow up in about a month- bring home cuff and readings from the last month with you so we can reevaluate

## 2019-12-14 LAB — URINE CULTURE
MICRO NUMBER:: 10352192
Result:: NO GROWTH
SPECIMEN QUALITY:: ADEQUATE

## 2020-01-03 ENCOUNTER — Encounter: Payer: Self-pay | Admitting: Cardiology

## 2020-01-03 ENCOUNTER — Other Ambulatory Visit: Payer: Self-pay

## 2020-01-03 ENCOUNTER — Ambulatory Visit: Payer: Medicare HMO | Admitting: Cardiology

## 2020-01-03 DIAGNOSIS — E7800 Pure hypercholesterolemia, unspecified: Secondary | ICD-10-CM

## 2020-01-03 DIAGNOSIS — R030 Elevated blood-pressure reading, without diagnosis of hypertension: Secondary | ICD-10-CM

## 2020-01-03 DIAGNOSIS — R0789 Other chest pain: Secondary | ICD-10-CM

## 2020-01-03 NOTE — Patient Instructions (Signed)
Medication Instructions:  No changes  *If you need a refill on your cardiac medications before your next appointment, please call your pharmacy*   Lab Work: Not needed If you have labs (blood work) drawn today and your tests are completely normal, you will receive your results only by: Marland Kitchen MyChart Message (if you have MyChart) OR . A paper copy in the mail If you have any lab test that is abnormal or we need to change your treatment, we will call you to review the results.   Testing/Procedures: not needed   Follow-Up: At [facility], you and your health needs are our priority.  As part of our continuing mission to provide you with exceptional heart care, we have created designated Provider Care Teams.  These Care Teams include your primary Cardiologist (physician) and Advanced Practice Providers (APPs -  Physician Assistants and Nurse Practitioners) who all work together to provide you with the care you need, when you need it.     Your next appointment:    as needed  The format for your next appointment:   Either In Person or Virtual  Provider:   Glenetta Hew, MD   Other Instructions  Discus with  Your primary doctor about your calcium scoring

## 2020-01-03 NOTE — Progress Notes (Signed)
Primary Care Provider: Marin Olp, MD Cardiologist: Glenetta Hew, MD Electrophysiologist: None  Clinic Note: Chief Complaint  Patient presents with  . New Patient (Initial Visit)    Chest pain    HPI:    [name] is a 79 y.o. female with a history of hypertension (white coat), hyperlipidemia who is being seen today for the evaluation of ATYPICAL CHEST PAIN at the request of Marin Olp, MD.  [name] was last seen on by Dr. Yong Channel on December 13, 2019 with a complaint of left lower rib pain there was 4-10 at worst.  Can last off and on most the day.  Not worse with exertion. ->  Referred for cardiology evaluation based on risk factors of borderline hypertension and hyperlipidemia.  Recent Hospitalizations: N/A  Reviewed  CV studies:    The following studies were reviewed today: (if available, images/films reviewed: From Epic Chart or Care Everywhere) . N/A:   Interval History:   [name] is here today for evaluation of chest discomfort which she has she describes more than epigastric discomfort.  She tells me she is not actually having any more of the left upper chest discomfort now.  She said that the left chest discomfort pretty much resolved a couple days after seeing her PCP.  But she has now is mostly epigastric-left upper quadrant discomfort that is tender to palpation.  She says is worse with certain foods.  It tends to increase simply with eating but certain foods make it worse.  It is not necessarily made worse with exertion. She has not had any heart failure symptoms or any arrhythmias.  CV Review of Symptoms (Summary) Cardiovascular ROS: positive for - chest pain and Mostly described as left upper quadrant and epigastric discomfort-underneath the left rib negative for - dyspnea on exertion, edema, irregular heartbeat, orthopnea, palpitations, paroxysmal nocturnal dyspnea, rapid heart rate, shortness of breath or Syncope/near syncope,  TIA/amaurosis fugax, claudication For the most part she is quite active does gardening and yard work and enjoys walking around the neighborhood.  Symptoms not exacerbated by this.   She indicates that her PCP did try to start her on Zocor but she is unable to tolerate it.   The patient does not have symptoms concerning for COVID-19 infection (fever, chills, cough, or new shortness of breath).  The patient is practicing social distancing & Masking.    REVIEWED OF SYSTEMS   ROS   I have reviewed and (if needed) personally updated the patient's problem list, medications, allergies, past medical and surgical history, social and family history.   PAST MEDICAL HISTORY   Past Medical History:  Diagnosis Date  . Anxiety   . Arthritis    neck, back  . Hx of colonoscopy with polypectomy   . Hyperlipidemia    diet controlled  . 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 SVD (spontaneous vaginal delivery)    x 4  . Thyroid disease     PAST SURGICAL HISTORY   Past Surgical History:  Procedure Laterality Date  . ABDOMINAL HYSTERECTOMY    . APPENDECTOMY    . BREAST SURGERY Right    LUMPECTOMY- FIBROID TUMOR benign  . COLONOSCOPY     hx polyps/ [facility]-mother   . ENTEROCELE REPAIR    . EXCISIONAL HEMORRHOIDECTOMY    . fnger surgery  04/2018  . POLYPECTOMY      MEDICATIONS/ALLERGIES   Current Meds  Medication Sig  . ALPRAZolam (XANAX) 0.25 MG tablet  Take 1-2 tablets (0.25-0.5 mg total) by mouth 2 (two) times daily as needed for anxiety.  Marland Kitchen CALCIUM-MAGNESIUM-VITAMIN D PO Take 1 tablet by mouth 2 (two) times daily.  . Cholecalciferol (VITAMIN D) 2000 UNITS tablet Take 2,000 Units by mouth daily.  Marland Kitchen conjugated estrogens (PREMARIN) vaginal cream Place 1 gram vaginally 1-2 times weekly  . levothyroxine (SYNTHROID) 75 MCG tablet TAKE 1 TABLET EVERY DAY  . MAGNESIUM PO Take 1 tablet by mouth daily.  Marland Kitchen omeprazole (PRILOSEC) 40 MG capsule Take 1 capsule (40 mg total) by mouth daily.  Marland Kitchen OVER THE  COUNTER MEDICATION Take 1 capsule by mouth at bedtime. [employer] OTC SLEEP AID - 25 mg qhs    Allergies  Allergen Reactions  . Crab [Shellfish Allergy] Other (See Comments)    Severe stomach pains from crab    SOCIAL HISTORY/FAMILY HISTORY   Social History   Tobacco Use  . Smoking status: Former Smoker    Packs/day: 1.00    Years: 13.00    Pack years: 13.00    Types: Cigarettes    Start date: 09/02/1965    Quit date: 09/02/1978    Years since quitting: 41.3  . Smokeless tobacco: Never Used  . Tobacco comment: quit in 1980  Substance Use Topics  . Alcohol use: No    Alcohol/week: 0.0 standard drinks  . Drug use: No   Social History   Social History Narrative   Married 61 years in 2019. 4 children, 3 living. 5 grandkids.    Poodle 12.5 in 2019.       Housewife.    Some college- [employer]      Hobbies: travel, walking, work out in yard   Family History  Problem Relation Age of Onset  . Colon cancer Mother 58  . Anxiety disorder Mother   . Deep vein thrombosis Mother        died age 66  . Heart disease Father 28       smoker, high stress job  . Colon cancer Brother 46  . Colon cancer Maternal Aunt 30  . Colon cancer Son 58  . Colon cancer Paternal Grandmother   . Colon cancer Other 32  . Hodgkin's lymphoma Brother 60  . Breast cancer Daughter   . Colon polyps Neg Hx   . Esophageal cancer Neg Hx   . Rectal cancer Neg Hx   . Stomach cancer Neg Hx       OBJCTIVE -PE, EKG, labs   Wt Readings from Last 3 Encounters:  01/03/20 133 lb (60.3 kg)  12/13/19 130 lb (59 kg)  09/27/19 135 lb 6.4 oz (61.4 kg)    Physical Exam: BP 130/80   Pulse 72   Ht 5' 5.5" (1.664 m)   Wt 133 lb (60.3 kg)   LMP  (LMP Unknown)   SpO2 99%   BMI 21.80 kg/m  Physical Exam  Constitutional: She is oriented to person, place, and time. She appears well-developed and well-nourished. No distress.  Somewhat thin but well-nourished elderly woman.  Well-groomed.  HENT:  Head: Normocephalic  and atraumatic.  Eyes: Conjunctivae and EOM are normal.  Neck: No hepatojugular reflux and no JVD present. Carotid bruit is not present.  Cardiovascular: Normal rate, regular rhythm, normal heart sounds and intact distal pulses.  No extrasystoles are present. PMI is not displaced. Exam reveals no gallop and no friction rub.  No murmur heard. Pulmonary/Chest: Effort normal and breath sounds normal. No respiratory distress. She has no wheezes. She has  no rales.  Abdominal: Soft. Bowel sounds are normal. There is abdominal tenderness (Tenderness to palpitation along the left upper quadrant, under the lower ribs.). There is no rebound and no guarding.  No HSM  Musculoskeletal:        General: No edema. Normal range of motion.     Cervical back: Normal range of motion and neck supple.  Neurological: She is alert and oriented to person, place, and time.  Psychiatric: She has a normal mood and affect. Her behavior is normal. Judgment and thought content normal.  Vitals reviewed.    Adult ECG Report = Not checked here.  PCP EKG was normal.  Recent Labs: Recently checked.  Reviewed Lab Results  Component Value Date   CHOL 225 (H) 12/13/2019   HDL 49.10 12/13/2019   LDLCALC 152 (H) 12/13/2019   LDLDIRECT 143.0 02/23/2018   TRIG 115.0 12/13/2019   CHOLHDL 5 12/13/2019   Lab Results  Component Value Date   CREATININE 0.85 12/13/2019   BUN 11 12/13/2019   NA 138 12/13/2019   K 4.2 12/13/2019   CL 101 12/13/2019   CO2 31 12/13/2019   Lab Results  Component Value Date   TSH 0.35 12/13/2019    ASSESSMENT/PLAN    Problem List Items Addressed This Visit    Hypercholesterolemia    Did not tolerate simvastatin.  Was recently started with unable to tolerate even 10 mg.  Perhaps either pravastatin or rosuvastatin to be more tolerated.-I would defer to PCP.  Given her elevated lipid levels and borderline hypertension, we did talk about potential screening test for CAD.  At this point I  think stress test would likely not shows any significant baseline CAD information, however a Coronary Calcium Score may very well be a good choice.  She will discussed this in follow-up visits with her PCP.      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  Given her advanced age, probably would not be overly aggressive to treat.      Atypical chest pain    Sound like she had some left-sided chest discomfort when she saw her PCP, but now is mostly noting discomfort up underneath the left rib cage.  The symptoms are not exertional and are exacerbated by food.  She is not having any further concerning chest discomfort and no exertional dyspnea.  We discussed options of the evaluation, but truly believe that a stress test would likely be negative and overly helpful.  She indicates that since she is not really having any more of the concerning chest pain and current symptoms are reproducible she agrees that stress testing is probably not necessary.  For now we will simply monitor to see if symptoms become exertional or exacerbated.          COVID-19 Education: The signs and symptoms of COVID-19 were discussed with the patient and how to seek care for testing (follow up with PCP or arrange E-visit).   The importance of social distancing and COVID-19 vaccination was discussed today.  I spent a total of 59minutes with the patient. >  50% of the time was spent in direct patient consultation.  Additional time spent with chart review  / charting (studies, outside notes, etc): 8 Total Time: 30 min   Current medicines are reviewed at length with the patient today.  (+/- concerns) none  Notice: This dictation was prepared with Dragon dictation along with smaller phrase technology. Any transcriptional errors  that result from this process are unintentional and may not be corrected upon review.  Patient Instructions / Medication Changes & Studies & Tests  Ordered   Patient Instructions  Medication Instructions:  No changes  *If you need a refill on your cardiac medications before your next appointment, please call your pharmacy*   Lab Work: Not needed If you have labs (blood work) drawn today and your tests are completely normal, you will receive your results only by: Marland Kitchen MyChart Message (if you have MyChart) OR . A paper copy in the mail If you have any lab test that is abnormal or we need to change your treatment, we will call you to review the results.   Testing/Procedures: not needed   Follow-Up: At [name], you and your health needs are our priority.  As part of our continuing mission to provide you with exceptional heart care, we have created designated Provider Care Teams.  These Care Teams include your primary Cardiologist (physician) and Advanced Practice Providers (APPs -  Physician Assistants and Nurse Practitioners) who all work together to provide you with the care you need, when you need it.     Your next appointment:    as needed  The format for your next appointment:   Either In Person or Virtual  Provider:   Glenetta Hew, MD   Other Instructions  Discus with  Your primary doctor about your calcium scoring     Studies Ordered:   No orders of the defined types were placed in this encounter.    Glenetta Hew, M.D., M.S. Interventional Cardiologist   Pager # [phone] Phone # [phone] [address] [place], [place] [address]   Thank you for choosing Heart[facility] at [facility]!!

## 2020-01-06 ENCOUNTER — Encounter: Payer: Self-pay | Admitting: Cardiology

## 2020-01-06 DIAGNOSIS — R0789 Other chest pain: Secondary | ICD-10-CM | POA: Insufficient documentation

## 2020-01-06 NOTE — Assessment & Plan Note (Signed)
Sound like she had some left-sided chest discomfort when she saw her PCP, but now is mostly noting discomfort up underneath the left rib cage.  The symptoms are not exertional and are exacerbated by food.  She is not having any further concerning chest discomfort and no exertional dyspnea.  We discussed options of the evaluation, but truly believe that a stress test would likely be negative and overly helpful.  She indicates that since she is not really having any more of the concerning chest pain and current symptoms are reproducible she agrees that stress testing is probably not necessary.  For now we will simply monitor to see if symptoms become exertional or exacerbated.

## 2020-01-06 NOTE — Assessment & Plan Note (Addendum)
Did not tolerate simvastatin.  Was recently started with unable to tolerate even 10 mg.  Perhaps either pravastatin or rosuvastatin to be more tolerated.-I would defer to PCP.  Given her elevated lipid levels and borderline hypertension, we did talk about potential screening test for CAD.  At this point I think stress test would likely not shows any significant baseline CAD information, however a Coronary Calcium Score may very well be a good choice.  She will discussed this in follow-up visits with her PCP.

## 2020-01-06 NOTE — Assessment & Plan Note (Signed)
Blood pressure is pretty good here today.  Given her advanced age, probably would not be overly aggressive to treat.

## 2020-01-17 ENCOUNTER — Other Ambulatory Visit: Payer: Self-pay

## 2020-01-17 ENCOUNTER — Ambulatory Visit (INDEPENDENT_AMBULATORY_CARE_PROVIDER_SITE_OTHER): Payer: Medicare HMO | Admitting: Family Medicine

## 2020-01-17 ENCOUNTER — Encounter: Payer: Self-pay | Admitting: Family Medicine

## 2020-01-17 VITALS — BP 120/78 | HR 82 | Temp 97.3°F | Ht 65.5 in | Wt 132.0 lb

## 2020-01-17 DIAGNOSIS — R0789 Other chest pain: Secondary | ICD-10-CM | POA: Diagnosis not present

## 2020-01-17 DIAGNOSIS — E7800 Pure hypercholesterolemia, unspecified: Secondary | ICD-10-CM

## 2020-01-17 DIAGNOSIS — R030 Elevated blood-pressure reading, without diagnosis of hypertension: Secondary | ICD-10-CM | POA: Diagnosis not present

## 2020-01-17 DIAGNOSIS — F411 Generalized anxiety disorder: Secondary | ICD-10-CM | POA: Diagnosis not present

## 2020-01-17 MED ORDER — BUSPIRONE HCL 5 MG PO TABS: 5.0000 mg | ORAL_TABLET | Freq: Two times a day (BID) | ORAL | 3 refills | Status: DC

## 2020-01-17 NOTE — Assessment & Plan Note (Signed)
S:last visit started omeprazole  40mg  daily. She has only had one time after starting the omeprazole.  She has missed a few doses since that time and not have recurrence. A/P: When she runs out of the omeprazole 40 mg I am okay with her trying a few days without medicine at all-if she has recurrence she can use 20 mg over-the-counter-if she does well without medicine she can remain off.  Also please note patient had evaluation by cardiology for chest pain and they did not recommend stress testing given improvement with omeprazole.

## 2020-01-17 NOTE — Progress Notes (Signed)
Phone [phone] In person visit   Subjective:   [name] is a 79 y.o. year old very pleasant female patient who presents for/with See problem oriented charting Chief Complaint  Patient presents with  . Hypertension    This visit occurred during the SARS-CoV-2 public health emergency.  Safety protocols were in place, including screening questions prior to the visit, additional usage of staff PPE, and extensive cleaning of exam room while observing appropriate contact time as indicated for disinfecting solutions.   Past Medical History-  Patient Active Problem List   Diagnosis Date Noted  . GAD (generalized anxiety disorder) 06/01/2018    Priority: Medium  . White coat syndrome without hypertension 06/01/2018    Priority: Medium  . Hypercholesterolemia 12/10/2011    Priority: Medium  . Hypothyroidism 12/10/2011    Priority: Medium  . Other and unspecified ovarian cyst 10/25/2013    Priority: Low  . Vaginal atrophy 06/03/2013    Priority: Low  . Hx of colonic polyps 12/10/2011    Priority: Low  . Atypical chest pain 01/06/2020    Medications- reviewed and updated Current Outpatient Medications  Medication Sig Dispense Refill  . ALPRAZolam (XANAX) 0.25 MG tablet Take 1-2 tablets (0.25-0.5 mg total) by mouth 2 (two) times daily as needed for anxiety. 120 tablet 0  . CALCIUM-MAGNESIUM-VITAMIN D PO Take 1 tablet by mouth 2 (two) times daily.    . Cholecalciferol (VITAMIN D) 2000 UNITS tablet Take 2,000 Units by mouth daily.    Marland Kitchen conjugated estrogens (PREMARIN) vaginal cream Place 1 gram vaginally 1-2 times weekly 42.5 g 5  . levothyroxine (SYNTHROID) 75 MCG tablet TAKE 1 TABLET EVERY DAY 90 tablet 3  . MAGNESIUM PO Take 1 tablet by mouth daily.    Marland Kitchen omeprazole (PRILOSEC) 40 MG capsule Take 1 capsule (40 mg total) by mouth daily. 30 capsule 0  . OVER THE COUNTER MEDICATION Take 1 capsule by mouth at bedtime. [employer] OTC SLEEP AID - 25 mg qhs     No current  facility-administered medications for this visit.     Objective:  BP 120/78   Pulse 82   Temp (!) 97.3 F (36.3 C) (Temporal)   Ht 5' 5.5" (1.664 m)   Wt 132 lb (59.9 kg)   LMP  (LMP Unknown)   SpO2 94%   BMI 21.63 kg/m  Gen: NAD, resting comfortably CV: RRR no murmurs rubs or gallops Lungs: CTAB no crackles, wheeze, rhonchi Ext: no edema Skin: warm, dry    Assessment and Plan   # GAD S: Taking Alprazolam 0.25 mgup to four times a day.  We gave her information for behavioral health at last visit-she has not called since that visit.  She felt really run down on sertraline GAD 7 : Generalized Anxiety Score 05/14/2019 06/01/2018  Nervous, Anxious, on Edge 3 3  Control/stop worrying 3 1  Worry too much - different things 0 0  Trouble relaxing 3 1  Restless 1 0  Easily annoyed or irritable 1 1  Afraid - awful might happen 0 0  Total GAD 7 Score 11 6  Anxiety Difficulty Not difficult at all Not difficult at all  A/P: Poor control of anxiety and she is on alprazolam 0.25 mg up to 4 times a day-she did not tolerate trial of sertraline-felt extremely rundown.  -We discussed briefly considering buspirone/BuSpar-she is agreeable to try 5 mg twice a day-extremely low-dose to see if we can help anxiety improve-for now she will continue alprazolam at  the same dose but she may decrease if she starts to feel better with the buspirone  # Gerd/atypical Chest pain  S:last visit started omeprazole  40mg  daily. She has only had one time after starting the omeprazole.  She has missed a few doses since that time and not have recurrence. A/P: When she runs out of the omeprazole 40 mg I am okay with her trying a few days without medicine at all-if she has recurrence she can use 20 mg over-the-counter-if she does well without medicine she can remain off.  Also please note patient had evaluation by cardiology for chest pain and they did not recommend stress testing given improvement with  omeprazole.  # Hyperlipidemia  S: At last visit discussed starting on rosuvastatin 5 mg even just once a week but was hesitant  due to side effects in the past- did not start as a result A/P: At her age and with prior side effects on statin medication she wishes to remain off cholesterol medicine at this time-since cardiology did not think her chest pain was heart related this gives me some reassurance and I am okay monitoring without medicine  # Whitecoathypertension S: Patient has brought her home cuff today on initial readings home cuff ran slightly higher but on my check home cuff was very close.   Over last 5 readings avg bp 138.4 over 63.4.  [id number]   Systolic total Diastolic total  Q000111Q 60   0000000 317  134 58  avg 138.4 63.4  134 59      141 67      A/P: reasonable control at home -  Continue to monitor blood pressure at home and as long as remains less than 140/90 on a weekly basis on average I am okay continuing without medicine-I am not formally diagnosing you as having high blood pressure-we will leave the label of whitecoat hypertension/high blood pressure on your list   Recommended follow up: Return in about 5 months (around 06/18/2020) for follow up- or sooner if needed.  Lab/Order associations:   ICD-10-CM   1. Atypical chest pain  R07.89   2. GAD (generalized anxiety disorder)  F41.1   3. White coat syndrome without hypertension  R03.0   4. Hypercholesterolemia  E78.00    Meds ordered this encounter  Medications  . busPIRone (BUSPAR) 5 MG tablet    Sig: Take 1 tablet (5 mg total) by mouth 2 (two) times daily.    Dispense:  180 tablet    Refill:  3   Return precautions advised.  Garret Reddish, MD

## 2020-01-17 NOTE — Assessment & Plan Note (Signed)
S: Taking Alprazolam 0.25 mgup to four times a day.  We gave her information for behavioral health at last visit-she has not called since that visit.  She felt really run down on sertraline GAD 7 : Generalized Anxiety Score 05/14/2019 06/01/2018  Nervous, Anxious, on Edge 3 3  Control/stop worrying 3 1  Worry too much - different things 0 0  Trouble relaxing 3 1  Restless 1 0  Easily annoyed or irritable 1 1  Afraid - awful might happen 0 0  Total GAD 7 Score 11 6  Anxiety Difficulty Not difficult at all Not difficult at all  A/P: Poor control of anxiety and she is on alprazolam 0.25 mg up to 4 times a day-she did not tolerate trial of sertraline-felt extremely rundown.  -We discussed briefly considering buspirone/BuSpar-she is agreeable to try 5 mg twice a day-extremely low-dose to see if we can help anxiety improve-for now she will continue alprazolam at the same dose but she may decrease if she starts to feel better with the buspirone

## 2020-01-17 NOTE — Assessment & Plan Note (Signed)
S: At last visit discussed starting on rosuvastatin 5 mg even just once a week but was hesitant  due to side effects in the past- did not start as a result A/P: At her age and with prior side effects on statin medication she wishes to remain off cholesterol medicine at this time-since cardiology did not think her chest pain was heart related this gives me some reassurance and I am okay monitoring without medicine

## 2020-01-17 NOTE — Patient Instructions (Addendum)
Health Maintenance Due  Topic Date Due  . MAMMOGRAM -Call for mammogram appointment.  05/28/2019   Anxiety- -We discussed briefly considering buspirone/BuSpar-she is agreeable to try 5 mg twice a day-extremely low-dose to see if we can help anxiety improve-for now she will continue alprazolam at the same dose but she may decrease if she starts to feel better with the buspirone  Blood pressure- Continue to monitor blood pressure at home and as long as remains less than 140/90 on a weekly basis on average I am okay continuing without medicine-I am not formally diagnosing you as having high blood pressure-we will leave the label of whitecoat hypertension/high blood pressure on your list  Chest pain/reflux- When she runs out of the omeprazole 40 mg I am okay with her trying a few days without medicine at all-if she has recurrence she can use 20 mg over-the-counter-if she does well without medicine she can remain off.  Also please note patient had evaluation by cardiology for chest pain and they did not recommend stress testing given improvement with omeprazole. She knows to let us know if has new or worsening symptoms   Recommended follow up: Return in about 5 months (around 06/18/2020) for follow up- or sooner if needed.

## 2020-01-17 NOTE — Assessment & Plan Note (Signed)
S: Patient has brought her home cuff today on initial readings home cuff ran slightly higher but on my check home cuff was very close.   Over last 5 readings avg bp 138.4 over 63.4.  [id number]   Systolic total Diastolic total  Q000111Q 60   0000000 317  134 58  avg 138.4 63.4  134 59      141 67      A/P: reasonable control at home -  Continue to monitor blood pressure at home and as long as remains less than 140/90 on a weekly basis on average I am okay continuing without medicine-I am not formally diagnosing you as having high blood pressure-we will leave the label of whitecoat hypertension/high blood pressure on your list

## 2020-02-07 ENCOUNTER — Other Ambulatory Visit: Payer: Self-pay

## 2020-02-07 ENCOUNTER — Ambulatory Visit (INDEPENDENT_AMBULATORY_CARE_PROVIDER_SITE_OTHER): Payer: Medicare HMO | Admitting: Family Medicine

## 2020-02-07 ENCOUNTER — Encounter: Payer: Self-pay | Admitting: Family Medicine

## 2020-02-07 VITALS — BP 140/70 | HR 70 | Temp 98.5°F | Ht 65.5 in | Wt 131.2 lb

## 2020-02-07 DIAGNOSIS — K219 Gastro-esophageal reflux disease without esophagitis: Secondary | ICD-10-CM

## 2020-02-07 DIAGNOSIS — R1011 Right upper quadrant pain: Secondary | ICD-10-CM | POA: Diagnosis not present

## 2020-02-07 DIAGNOSIS — R1012 Left upper quadrant pain: Secondary | ICD-10-CM

## 2020-02-07 DIAGNOSIS — F411 Generalized anxiety disorder: Secondary | ICD-10-CM | POA: Diagnosis not present

## 2020-02-07 MED ORDER — BUSPIRONE HCL 5 MG PO TABS: 5.0000 mg | ORAL_TABLET | Freq: Three times a day (TID) | ORAL | 3 refills | Status: DC

## 2020-02-07 MED ORDER — OMEPRAZOLE 40 MG PO CPDR: 40.0000 mg | DELAYED_RELEASE_CAPSULE | Freq: Every day | ORAL | 5 refills | Status: DC

## 2020-02-07 MED ORDER — OMEPRAZOLE 40 MG PO CPDR: 40.0000 mg | DELAYED_RELEASE_CAPSULE | Freq: Every day | ORAL | 1 refills | Status: DC

## 2020-02-07 NOTE — Patient Instructions (Addendum)
We will call you within two weeks about your referral for ultrasound of abdomen. If you do not hear within 3 weeks, give [place] a call.   Restart omeprazole 40mg  for potential reflux component  If new or worsening symptoms please let me know or if symptoms do not significantly improve. If bloodwork and ultrasound normal but ongoing symptoms not improving on medicine- consider CT   poor control of anxiety. Increase buspirone 5 mg to 3 times a day due to poor control.  She is not making significant progress in 2 weeks or so can take 1.5 tablets 3 times a day.  For now she can continue alprazolam-if she is notes anxiety decreases some I would like her to trial down to 3 times a day of alprazolam-we may ultimately need to get psychiatry involved to see if they can help [place] control symptoms better  Please stop by lab before you go If you have mychart- we will send your results within 3 business days of [place] receiving them.  If you do not have mychart- we will call you about results within 5 business days of [place] receiving them.

## 2020-02-07 NOTE — Progress Notes (Signed)
Phone [phone] In person visit   Subjective:   [name] is a 79 y.o. year old very pleasant female patient who presents for/with See problem oriented charting Chief Complaint  Patient presents with   Follow-up   Gastroesophageal Reflux    This visit occurred during the SARS-CoV-2 public health emergency.  Safety protocols were in place, including screening questions prior to the visit, additional usage of staff PPE, and extensive cleaning of exam room while observing appropriate contact time as indicated for disinfecting solutions.   Past Medical History-  Patient Active Problem List   Diagnosis Date Noted   GERD (gastroesophageal reflux disease) 02/07/2020    Priority: Medium   GAD (generalized anxiety disorder) 06/01/2018    Priority: Medium   White coat syndrome without hypertension 06/01/2018    Priority: Medium   Hypercholesterolemia 12/10/2011    Priority: Medium   Hypothyroidism 12/10/2011    Priority: Medium   Other and unspecified ovarian cyst 10/25/2013    Priority: Low   Vaginal atrophy 06/03/2013    Priority: Low   Hx of colonic polyps 12/10/2011    Priority: Low   Atypical chest pain 01/06/2020    Medications- reviewed and updated Current Outpatient Medications  Medication Sig Dispense Refill   ALPRAZolam (XANAX) 0.25 MG tablet Take 1-2 tablets (0.25-0.5 mg total) by mouth 2 (two) times daily as needed for anxiety. 120 tablet 0   busPIRone (BUSPAR) 5 MG tablet Take 1 tablet (5 mg total) by mouth 3 (three) times daily. 270 tablet 3   CALCIUM-MAGNESIUM-VITAMIN D PO Take 1 tablet by mouth 2 (two) times daily.     Cholecalciferol (VITAMIN D) 2000 UNITS tablet Take 2,000 Units by mouth daily.     conjugated estrogens (PREMARIN) vaginal cream Place 1 gram vaginally 1-2 times weekly 42.5 g 5   levothyroxine (SYNTHROID) 75 MCG tablet TAKE 1 TABLET EVERY DAY 90 tablet 3   MAGNESIUM PO Take 1 tablet by mouth daily.     OVER THE COUNTER  MEDICATION Take 1 capsule by mouth at bedtime. [facility] OTC SLEEP AID - 25 mg qhs     omeprazole (PRILOSEC) 40 MG capsule Take 1 capsule (40 mg total) by mouth daily. 90 capsule 1   No current facility-administered medications for this visit.     Objective:  BP 140/70    Pulse 70    Temp 98.5 F (36.9 C)    Ht 5' 5.5" (1.664 m)    Wt 131 lb 3.2 oz (59.5 kg)    LMP  (LMP Unknown)    SpO2 98%    BMI 21.50 kg/m  Gen: NAD, resting comfortably CV: RRR no murmurs rubs or gallops Lungs: CTAB no crackles, wheeze, rhonchi Abdomen: soft/nontender- states has not worsened yet today but usually wil start around this time/nondistended/normal bowel sounds. No rebound or guarding.  Ext: no edema Skin: warm, dry    Assessment and Plan  # GERD/ upper abdominal heaviness S: Patient was on omeprazole 40mg  for 1 month- even with this felt an upper abdominal heaviness particularly with laying down. When she stopped the medicine- when she stopped the medicine noted increasing abdominal discomfort but also having pain into RUQ and into LUQ. She is taking magnesium and she thinks that's helping her bowel movements. The pain that she feels is dull and constant once it starts that drains her energy. This has been going on for a while and she does not think its reflux since it has gotten worse- though she  has been off PPI for sometime. No issues with eating/drinking or different foods. No constipation/nausea/vomiting.   Usually wakes up feeling ok and does ok through lunch but develops by 2-3 PM- grinding upper abdominal pain. Feels tired before bed. Ways on her wondering what it could be.   Colonoscopy February 22, 2019 with Dr. Jerlyn Ly some polyps but no repeat due to age. CT abd/pelvis dec 2019 largely reassuring. Does have history of hematuria- no recent issues with urine. Urine culture negative in April as well as urine microscopic negative for blood.  A/P: 79 year old female with right upper quadrant and left  upper quadrant pain radiating down her left side-symptoms worsened off of PPI therapy but symptoms never fully resolved even on medicine-we are going to get more aggressive about work-up and do an ultrasound of the abdomen as well as updating blood work as below. -From AVS "  Patient Instructions  We will call you within two weeks about your referral for ultrasound of abdomen. If you do not hear within 3 weeks, give [place] a call.   Restart omeprazole 40mg  for potential reflux component  If new or worsening symptoms please let me know or if symptoms do not significantly improve. If bloodwork and ultrasound normal but ongoing symptoms not improving on medicine- consider CT   poor control of anxiety. Increase buspirone 5 mg to 3 times a day due to poor control.  She is not making significant progress in 2 weeks or so can take 1.5 tablets 3 times a day.  For now she can continue alprazolam-if she is notes anxiety decreases some I would like her to trial down to 3 times a day of alprazolam-we may ultimately need to get psychiatry involved to see if they can help [place] control symptoms better  Please stop by lab before you go If you have mychart- we will send your results within 3 business days of [place] receiving them.  If you do not have mychart- we will call you about results within 5 business days of [place] receiving them.      "  #Generalized anxiety disorder S: Patient has not tolerated SSRI therapy in the past.  She is still on alprazolam 4 times a day.  We tried to add buspirone last visit to see if this would help and reduce her need for alprazolam but she has not noted a substantial difference yet-we will increase to 3 times a day and see if she can reduce her alprazolam to 3 times a day  A lot of stress worried about her husband.  A/P: poor control of anxiety. Increase buspirone 5 mg to 3 times a day due to poor control.  She is not making significant progress in 2 weeks or so can take 1.5 tablets 3  times a day.  For now she can continue alprazolam-if she is notes anxiety decreases some I would like her to trial down to 3 times a day of alprazolam-we may ultimately need to get psychiatry involved to see if they can help [place] control symptoms better  Recommended follow up: Return in about 6 weeks (around 03/20/2020). No future appointments.  Lab/Order associations:   ICD-10-CM   1. RUQ pain  R10.11 US Abdomen Complete    CBC with Differential/Platelet    Comprehensive metabolic panel    Lipase    Amylase  2. LUQ pain  R10.12 US Abdomen Complete    CBC with Differential/Platelet    Comprehensive metabolic panel    Lipase  Amylase  3. GAD (generalized anxiety disorder)  F41.1   4. Gastroesophageal reflux disease without esophagitis  K21.9     Meds ordered this encounter  Medications   DISCONTD: omeprazole (PRILOSEC) 40 MG capsule    Sig: Take 1 capsule (40 mg total) by mouth daily.    Dispense:  30 capsule    Refill:  5   busPIRone (BUSPAR) 5 MG tablet    Sig: Take 1 tablet (5 mg total) by mouth 3 (three) times daily.    Dispense:  270 tablet    Refill:  3   omeprazole (PRILOSEC) 40 MG capsule    Sig: Take 1 capsule (40 mg total) by mouth daily.    Dispense:  90 capsule    Refill:  1   Return precautions advised.  Garret Reddish, MD

## 2020-02-08 LAB — COMPREHENSIVE METABOLIC PANEL
ALT: 16 U/L (ref 0–35)
AST: 22 U/L (ref 0–37)
Albumin: 4.1 g/dL (ref 3.5–5.2)
Alkaline Phosphatase: 64 U/L (ref 39–117)
BUN: 17 mg/dL (ref 6–23)
CO2: 26 mEq/L (ref 19–32)
Calcium: 9 mg/dL (ref 8.4–10.5)
Chloride: 105 mEq/L (ref 96–112)
Creatinine, Ser: 0.83 mg/dL (ref 0.40–1.20)
GFR: 66.08 mL/min (ref >60.00)
Glucose, Bld: 93 mg/dL (ref 70–99)
Potassium: 4.3 mEq/L (ref 3.5–5.1)
Sodium: 136 mEq/L (ref 135–145)
Total Bilirubin: 0.4 mg/dL (ref 0.2–1.2)
Total Protein: 6.4 g/dL (ref 6.0–8.3)

## 2020-02-08 LAB — CBC WITH DIFFERENTIAL/PLATELET
Basophils Absolute: 0 10*3/uL (ref 0.0–0.1)
Basophils Relative: 0.5 % (ref 0.0–3.0)
Eosinophils Absolute: 0.1 10*3/uL (ref 0.0–0.7)
Eosinophils Relative: 2 % (ref 0.0–5.0)
HCT: 40.2 % (ref 36.0–46.0)
Hemoglobin: 13.7 g/dL (ref 12.0–15.0)
Lymphocytes Relative: 30.2 % (ref 12.0–46.0)
Lymphs Abs: 1.8 10*3/uL (ref 0.7–4.0)
MCHC: 34.2 g/dL (ref 30.0–36.0)
MCV: 91.1 fl (ref 78.0–100.0)
Monocytes Absolute: 0.4 10*3/uL (ref 0.1–1.0)
Monocytes Relative: 7 % (ref 3.0–12.0)
Neutro Abs: 3.6 10*3/uL (ref 1.4–7.7)
Neutrophils Relative %: 60.3 % (ref 43.0–77.0)
Platelets: 202 10*3/uL (ref 150.0–400.0)
RBC: 4.41 Mil/uL (ref 3.87–5.11)
RDW: 13.1 % (ref 11.5–15.5)
WBC: 5.9 10*3/uL (ref 4.0–10.5)

## 2020-02-08 LAB — AMYLASE: Amylase: 37 U/L (ref 27–131)

## 2020-02-08 LAB — LIPASE: Lipase: 24 U/L (ref 11.0–59.0)

## 2020-02-25 ENCOUNTER — Other Ambulatory Visit: Payer: Self-pay

## 2020-02-25 NOTE — Telephone Encounter (Signed)
Alpraxolam 0.25 mg   LAST APPOINTMENT DATE: 02/07/2020   NEXT APPOINTMENT DATE: 06/13/2020    LAST REFILL: 10/21/2019  QTY:120 0 rf

## 2020-02-28 ENCOUNTER — Other Ambulatory Visit: Payer: Self-pay | Admitting: Family Medicine

## 2020-02-28 MED ORDER — ALPRAZOLAM 0.25 MG PO TABS: 0.2500 mg | ORAL_TABLET | Freq: Two times a day (BID) | ORAL | 0 refills | Status: DC | PRN

## 2020-02-28 NOTE — Telephone Encounter (Signed)
I refilled this 

## 2020-02-28 NOTE — Telephone Encounter (Signed)
Med sent to Dr. Yong Channel for approval.

## 2020-02-28 NOTE — Telephone Encounter (Signed)
MEDICATION: ALPRAZolam (XANAX) 0.25 MG tablet  PHARMACY:  [facility] [place] Phone:  [phone]  Fax:  [phone]       Comments: Patient stated she is completely out   **Let patient know to contact pharmacy at the end of the day to make sure medication is ready. **  ** Please notify patient to allow 48-72 hours to process**  **Encourage patient to contact the pharmacy for refills or they can request refills through [facility]**

## 2020-02-29 MED ORDER — ALPRAZOLAM 0.25 MG PO TABS: 0.2500 mg | ORAL_TABLET | Freq: Two times a day (BID) | ORAL | 0 refills | Status: DC | PRN

## 2020-02-29 NOTE — Telephone Encounter (Signed)
Pt needs medications sent to [facility].  [place], [place]    Please remove [facility] pharmacy from chart.

## 2020-02-29 NOTE — Addendum Note (Signed)
Addended by: Clyde Lundborg A on: 02/29/2020 01:33 PM   Modules accepted: Orders

## 2020-04-03 ENCOUNTER — Telehealth: Payer: Self-pay

## 2020-04-03 MED ORDER — ALPRAZOLAM 0.25 MG PO TABS: 0.2500 mg | ORAL_TABLET | Freq: Two times a day (BID) | ORAL | 0 refills | Status: DC | PRN

## 2020-04-03 NOTE — Telephone Encounter (Signed)
I sent this-not sure what happened-last prescription said receipt confirmed by pharmacy and was listed as [facility]

## 2020-04-03 NOTE — Telephone Encounter (Signed)
Can you resend script?

## 2020-04-03 NOTE — Telephone Encounter (Signed)
According to pt, [name] is stating they never received a prescription for Xanax for pt.

## 2020-05-16 ENCOUNTER — Other Ambulatory Visit: Payer: Self-pay | Admitting: Family Medicine

## 2020-05-16 MED ORDER — ALPRAZOLAM 0.25 MG PO TABS: 0.2500 mg | ORAL_TABLET | Freq: Two times a day (BID) | ORAL | 0 refills | Status: DC | PRN

## 2020-05-16 NOTE — Telephone Encounter (Signed)
.. °  LAST APPOINTMENT DATE: 04/03/2020   NEXT APPOINTMENT DATE:@10 /08/2020  MEDICATION:ALPRAZolam Duanne Moron) 0.25 MG tablet  PHARMACY: [place], [place]  **Let patient know to contact pharmacy at the end of the day to make sure medication is ready. **  ** Please notify patient to allow 48-72 hours to process**  **Encourage patient to contact the pharmacy for refills or they can request refills through [facility]**  CLINICAL FILLS OUT ALL BELOW:   LAST REFILL:  QTY:  REFILL DATE:    OTHER COMMENTS: patient is requesting more refills. She states she has to call every month and doesn't like it    Okay for refill?  Please advise

## 2020-06-01 DIAGNOSIS — H5203 Hypermetropia, bilateral: Secondary | ICD-10-CM | POA: Diagnosis not present

## 2020-06-01 DIAGNOSIS — H2513 Age-related nuclear cataract, bilateral: Secondary | ICD-10-CM | POA: Diagnosis not present

## 2020-06-01 DIAGNOSIS — H25813 Combined forms of age-related cataract, bilateral: Secondary | ICD-10-CM | POA: Diagnosis not present

## 2020-06-13 ENCOUNTER — Encounter: Payer: Self-pay | Admitting: Family Medicine

## 2020-06-13 ENCOUNTER — Ambulatory Visit (INDEPENDENT_AMBULATORY_CARE_PROVIDER_SITE_OTHER): Payer: Medicare HMO | Admitting: Family Medicine

## 2020-06-13 ENCOUNTER — Other Ambulatory Visit: Payer: Self-pay

## 2020-06-13 VITALS — BP 140/90 | HR 77 | Temp 98.2°F | Ht 66.0 in | Wt 131.0 lb

## 2020-06-13 DIAGNOSIS — K219 Gastro-esophageal reflux disease without esophagitis: Secondary | ICD-10-CM

## 2020-06-13 DIAGNOSIS — E039 Hypothyroidism, unspecified: Secondary | ICD-10-CM | POA: Diagnosis not present

## 2020-06-13 DIAGNOSIS — R030 Elevated blood-pressure reading, without diagnosis of hypertension: Secondary | ICD-10-CM

## 2020-06-13 DIAGNOSIS — F411 Generalized anxiety disorder: Secondary | ICD-10-CM

## 2020-06-13 DIAGNOSIS — Z23 Encounter for immunization: Secondary | ICD-10-CM | POA: Diagnosis not present

## 2020-06-13 DIAGNOSIS — E7800 Pure hypercholesterolemia, unspecified: Secondary | ICD-10-CM

## 2020-06-13 MED ORDER — BUSPIRONE HCL 5 MG PO TABS: 5.0000 mg | ORAL_TABLET | Freq: Three times a day (TID) | ORAL | 3 refills | Status: DC

## 2020-06-13 MED ORDER — ALPRAZOLAM 0.25 MG PO TABS: 0.2500 mg | ORAL_TABLET | Freq: Two times a day (BID) | ORAL | 4 refills | Status: DC | PRN

## 2020-06-13 MED ORDER — LEVOTHYROXINE SODIUM 75 MCG PO TABS: ORAL_TABLET | ORAL | 3 refills | Status: DC

## 2020-06-13 NOTE — Patient Instructions (Addendum)
Health Maintenance Due  Topic Date Due  . MAMMOGRAM Scheduled for next week. 05/28/2019  . INFLUENZA VACCINE In office flu shot today high-dose 04/02/2020   Glad you are doing well  Check labs next visit

## 2020-06-13 NOTE — Addendum Note (Signed)
Addended by: Thomes Cake on: 06/13/2020 04:02 PM   Modules accepted: Orders

## 2020-06-13 NOTE — Assessment & Plan Note (Signed)
#  hyperlipidemia S: Medication: Never started rosuvastatin 5 mg-patient strongly prefers at her age to remain off medication. Depressed mood in past with simvastatin.  Lab Results  Component Value Date   CHOL 225 (H) 12/13/2019   HDL 49.10 12/13/2019   LDLCALC 152 (H) 12/13/2019   LDLDIRECT 143.0 02/23/2018   TRIG 115.0 12/13/2019   CHOLHDL 5 12/13/2019   A/P: poor control- focus on healthy eating/regular exercise.

## 2020-06-13 NOTE — Progress Notes (Signed)
Phone [phone] In person visit   Subjective:   [name] is a 79 y.o. year old very pleasant female patient who presents for/with See problem oriented charting Chief Complaint  Patient presents with  . Anxiety  . Hyperlipidemia  . Hypertension   This visit occurred during the SARS-CoV-2 public health emergency.  Safety protocols were in place, including screening questions prior to the visit, additional usage of staff PPE, and extensive cleaning of exam room while observing appropriate contact time as indicated for disinfecting solutions.   Past Medical History-  Patient Active Problem List   Diagnosis Date Noted  . GERD (gastroesophageal reflux disease) 02/07/2020    Priority: Medium  . GAD (generalized anxiety disorder) 06/01/2018    Priority: Medium  . White coat syndrome without hypertension 06/01/2018    Priority: Medium  . Hypercholesterolemia 12/10/2011    Priority: Medium  . Hypothyroidism 12/10/2011    Priority: Medium  . Other and unspecified ovarian cyst 10/25/2013    Priority: Low  . Vaginal atrophy 06/03/2013    Priority: Low  . Hx of colonic polyps 12/10/2011    Priority: Low  . Atypical chest pain 01/06/2020    Medications- reviewed and updated Current Outpatient Medications  Medication Sig Dispense Refill  . ALPRAZolam (XANAX) 0.25 MG tablet Take 1-2 tablets (0.25-0.5 mg total) by mouth 2 (two) times daily as needed for anxiety. 120 tablet 4  . aspirin EC 81 MG tablet Take 81 mg by mouth daily. Swallow whole.    . busPIRone (BUSPAR) 5 MG tablet Take 1 tablet (5 mg total) by mouth 3 (three) times daily. 270 tablet 3  . CALCIUM-MAGNESIUM-VITAMIN D PO Take 1 tablet by mouth 2 (two) times daily.    . Cholecalciferol (VITAMIN D) 2000 UNITS tablet Take 2,000 Units by mouth daily.    Marland Kitchen conjugated estrogens (PREMARIN) vaginal cream Place 1 gram vaginally 1-2 times weekly 42.5 g 5  . levothyroxine (SYNTHROID) 75 MCG tablet TAKE 1 TABLET EVERY DAY 90  tablet 3  . MAGNESIUM PO Take 1 tablet by mouth daily.    Marland Kitchen omeprazole (PRILOSEC) 40 MG capsule Take 1 capsule (40 mg total) by mouth daily. 90 capsule 1  . OVER THE COUNTER MEDICATION Take 1 capsule by mouth at bedtime. [facility] OTC SLEEP AID - 25 mg qhs     No current facility-administered medications for this visit.     Objective:  BP 140/90   Pulse 77   Temp 98.2 F (36.8 C) (Temporal)   Ht 5\' 6"  (1.676 m)   Wt 131 lb (59.4 kg)   LMP  (LMP Unknown)   SpO2 97%   BMI 21.14 kg/m  Gen: NAD, resting comfortably CV: RRR no murmurs rubs or gallops Lungs: CTAB no crackles, wheeze, rhonchi Ext: no edema Skin: warm, dry Neuro: grossly normal, moves all extremities     Assessment and Plan   #Right upper quadrant pain/GERD from June-labs including amylase, lipase CBC, CMP largely reassuring.  Recommended ultrasound which patient did not complete.  We also increased omeprazole to 40 mg for possible reflux component.  Discussed possible CT if not improving an ultrasound without obvious cause - she states as long as eats an apple and her omeprazole 40mg  no issues. I hesitate to go back down right now on omeprazole with how signficant discomfort was before- perhaps try again in 6 months.   # Anxiety S:Medication: Xanax 0.25Mg  4 times a day last visit-we increase buspirone to 3 times a day 5  mg with hopes to reduce alprazolam to at least 3 times a day.  Has not tolerated SSRIs in the past. She did not see the new rx and has still been on twice a day but has been able to occasionally take 3 alprazolam a day. Counseling: Have recommended behavioral health A/P: she feels reasonably controlled with alprazolam 3-4x a day 0.25mg  and buspirone twice a day- I will increase the buspirone to 3x a day with hopes of consistently getting alprazolam to 3x a day or less.    #hypothyroidism S: compliant On thyroid medication- Levothyroxine 75Mcg Lab Results  Component Value Date   TSH 0.35 12/13/2019    A/P:doing well- continue current meds   #hyperlipidemia S: Medication: Never started rosuvastatin 5 mg-patient strongly prefers at her age to remain off medication. Depressed mood in past with simvastatin.  Lab Results  Component Value Date   CHOL 225 (H) 12/13/2019   HDL 49.10 12/13/2019   LDLCALC 152 (H) 12/13/2019   LDLDIRECT 143.0 02/23/2018   TRIG 115.0 12/13/2019   CHOLHDL 5 12/13/2019   A/P: poor control- focus on healthy eating/regular exercise.  - shed prefer to remain on aspirin given cannot tolerate statin. Did discuss possible increased bleeding   #Whitecoat hypertension S: medication: none Home readings #s: over last 5  avg 134.8 61.6   BP Readings from Last 3 Encounters:  06/13/20 140/90  02/07/20 140/70  01/17/20 120/78  A/P: Blood pressure in office continues to be mildly elevated.  Her home average over the last time is well controlled at 135/62-continue without medication   Recommended follow up: Return in about 6 months (around 12/12/2020) for physical or sooner if needed.  Lab/Order associations:   ICD-10-CM   1. Gastroesophageal reflux disease without esophagitis  K21.9   2. Acquired hypothyroidism  E03.9   3. GAD (generalized anxiety disorder)  F41.1   4. Hypercholesterolemia  E78.00   5. 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 busPIRone (BUSPAR) 5 MG tablet    Sig: Take 1 tablet (5 mg total) by mouth 3 (three) times daily.    Dispense:  270 tablet    Refill:  3  . ALPRAZolam (XANAX) 0.25 MG tablet    Sig: Take 1-2 tablets (0.25-0.5 mg total) by mouth 2 (two) times daily as needed for anxiety.    Dispense:  120 tablet    Refill:  4  . levothyroxine (SYNTHROID) 75 MCG tablet    Sig: TAKE 1 TABLET EVERY DAY    Dispense:  90 tablet    Refill:  3    Return precautions advised.  Garret Reddish, MD

## 2020-07-10 DIAGNOSIS — M8588 Other specified disorders of bone density and structure, other site: Secondary | ICD-10-CM | POA: Diagnosis not present

## 2020-07-10 DIAGNOSIS — N958 Other specified menopausal and perimenopausal disorders: Secondary | ICD-10-CM | POA: Diagnosis not present

## 2020-07-10 DIAGNOSIS — Z6821 Body mass index (BMI) 21.0-21.9, adult: Secondary | ICD-10-CM | POA: Diagnosis not present

## 2020-07-10 DIAGNOSIS — Z01419 Encounter for gynecological examination (general) (routine) without abnormal findings: Secondary | ICD-10-CM | POA: Diagnosis not present

## 2020-08-22 ENCOUNTER — Other Ambulatory Visit: Payer: Self-pay | Admitting: Family Medicine

## 2020-08-29 DIAGNOSIS — Z1231 Encounter for screening mammogram for malignant neoplasm of breast: Secondary | ICD-10-CM | POA: Diagnosis not present

## 2020-09-13 DIAGNOSIS — L821 Other seborrheic keratosis: Secondary | ICD-10-CM | POA: Diagnosis not present

## 2020-10-02 ENCOUNTER — Ambulatory Visit: Payer: Medicare HMO | Admitting: Family Medicine

## 2020-10-02 NOTE — Patient Instructions (Addendum)
We will call you within a week about your referral to sports medicine. If you do not hear within a week, give [place] a call.   Try heat or ice for up to 20 minutes. Topical voltaren gel may also help  Recommended follow up: keep April visit

## 2020-10-02 NOTE — Progress Notes (Signed)
Phone [phone] In person visit   Subjective:   [name] is a 80 y.o. year old very pleasant female patient who presents for/with See problem oriented charting Chief Complaint  Patient presents with  . Cyst    Painful knot on her arm    This visit occurred during the SARS-CoV-2 public health emergency.  Safety protocols were in place, including screening questions prior to the visit, additional usage of staff PPE, and extensive cleaning of exam room while observing appropriate contact time as indicated for disinfecting solutions.   Past Medical History-  Patient Active Problem List   Diagnosis Date Noted  . GERD (gastroesophageal reflux disease) 02/07/2020    Priority: Medium  . GAD (generalized anxiety disorder) 06/01/2018    Priority: Medium  . White coat syndrome without hypertension 06/01/2018    Priority: Medium  . Hypercholesterolemia 12/10/2011    Priority: Medium  . Hypothyroidism 12/10/2011    Priority: Medium  . Other and unspecified ovarian cyst 10/25/2013    Priority: Low  . Vaginal atrophy 06/03/2013    Priority: Low  . Hx of colonic polyps 12/10/2011    Priority: Low  . Osteopenia 10/03/2020  . Atypical chest pain 01/06/2020    Medications- reviewed and updated Current Outpatient Medications  Medication Sig Dispense Refill  . ALPRAZolam (XANAX) 0.25 MG tablet Take 1-2 tablets (0.25-0.5 mg total) by mouth 2 (two) times daily as needed for anxiety. 120 tablet 4  . busPIRone (BUSPAR) 5 MG tablet Take 1 tablet (5 mg total) by mouth 3 (three) times daily. 270 tablet 3  . CALCIUM-MAGNESIUM-VITAMIN D PO Take 1 tablet by mouth 2 (two) times daily.    . Cholecalciferol (VITAMIN D) 2000 UNITS tablet Take 2,000 Units by mouth daily.    Marland Kitchen levothyroxine (SYNTHROID) 75 MCG tablet TAKE 1 TABLET EVERY DAY 90 tablet 3  . MAGNESIUM PO Take 1 tablet by mouth daily.    Marland Kitchen omeprazole (PRILOSEC) 40 MG capsule TAKE 1 CAPSULE EVERY DAY 90 capsule 1  . aspirin EC 81 MG  tablet Take 81 mg by mouth daily. Swallow whole. (Patient not taking: Reported on 10/03/2020)    . conjugated estrogens (PREMARIN) vaginal cream Place 1 gram vaginally 1-2 times weekly (Patient not taking: Reported on 10/03/2020) 42.5 g 5   No current facility-administered medications for this visit.     Objective:  BP (!) 172/96   Pulse 71   Temp 98.2 F (36.8 C) (Temporal)   Ht 5\' 6"  (1.676 m)   Wt 134 lb 3.2 oz (60.9 kg)   LMP  (LMP Unknown)   SpO2 97%   BMI 21.66 kg/m  Gen: NAD, resting comfortably CV: RRR no murmurs rubs or gallops Lungs: CTAB no crackles, wheeze, rhonchi Ext: no edema Skin: warm, dry Msk: very tender in area right over a scar on left forearm a few cm below lateral epicondyle. Resisted or passive pronation does not cause pain. Movement at hand/wrist does not reproduce- only pain with palpation- but can be severe- ptaient winces when I press on area several times. h    Assessment and Plan   Painful knot in arm S:patient with a painful knot on left forearm- if she touches it gets intense needle sticking sensation .started 1-2 weeks ago. First thought it was a bruise but there was no bruise. Pretty stable from when it started. No visible change on the skin. No fever/chills/nausea/vomiting. Has not felt ill overall. In general no chest pain or shortness of breath. Perhaps  faint chest pain last night but resolved this Am. No exertional element to pain from last night or left arm pain. Anything touching the area sets her off.   She has had shingles before and has not had shingrix yet or zostavax. Has not tried heat or ice yet  A/P: patient with pinpoint pain a few cm below lateral epicondyle that is not worsened by resisted pronation. Pain primarily occurs with palpation. No pain over bone. No obvious lateral epicondylitis.  -I think she may benefit from [facility] ultrasound- will do a referral to sports medicine and hopeful they can see her this week  -recommended trialing  heat or ice and seeing which is best -topical voltaren gel may help -doubt cardiac with pinpoint area of pain  #white coat  Elevated blood pressure without hypertension S: medication: none Home readings #s: has not checked much recently BP Readings from Last 3 Encounters:  10/03/20 (!) 172/96  06/13/20 140/90  02/07/20 140/70  A/P: blood pressure higher today than usual. Asked her to do some home monitoring and let me know if #s are not trending back down but I suspect may have some anxiety related to arm pain.   Recommended follow up: keep April visit Future Appointments  Date Time Provider [place]  12/18/2020  2:40 PM Marin Olp, MD [facility]    Lab/Order associations:   ICD-10-CM   1. Left forearm pain  M79.632 Ambulatory referral to Sports Medicine  2. White coat syndrome without hypertension  R03.0    Time Spent: 16 minutes of total time (1:07 PM- 1:23 PM) was spent on the date of the encounter performing the following actions: chart review prior to seeing the patient, obtaining history, performing a medically necessary exam, counseling on the treatment plan, placing orders, and documenting in our EHR.   Return precautions advised.  Garret Reddish, MD

## 2020-10-03 ENCOUNTER — Encounter: Payer: Self-pay | Admitting: Family Medicine

## 2020-10-03 ENCOUNTER — Ambulatory Visit (INDEPENDENT_AMBULATORY_CARE_PROVIDER_SITE_OTHER): Payer: Medicare HMO | Admitting: Family Medicine

## 2020-10-03 ENCOUNTER — Other Ambulatory Visit: Payer: Self-pay

## 2020-10-03 VITALS — BP 172/96 | HR 71 | Temp 98.2°F | Ht 66.0 in | Wt 134.2 lb

## 2020-10-03 DIAGNOSIS — R030 Elevated blood-pressure reading, without diagnosis of hypertension: Secondary | ICD-10-CM | POA: Diagnosis not present

## 2020-10-03 DIAGNOSIS — M79632 Pain in left forearm: Secondary | ICD-10-CM | POA: Diagnosis not present

## 2020-10-03 DIAGNOSIS — M8580 Other specified disorders of bone density and structure, unspecified site: Secondary | ICD-10-CM | POA: Insufficient documentation

## 2020-10-09 NOTE — Progress Notes (Signed)
Subjective:    CC: L forearm pain  I, [name], LAT, ATC, am serving as scribe for Dr. Lynne Leader.  HPI: Pt is a 80 y/o female presenting w/ c/o L forearm pain and a painful knot/nodule on her forearm x approximately 3 weeks w/ no known MOI.  She locates her pain to her L lateral forearm.  She denies any swelling.  She denies any injury.  She denies any pain with activity.  Pain only occurs when she palpates the area.  Radiating pain: no L forearm swelling: yes, localized pocket of swelling Aggravating factors: pressure to the area;  Treatments tried: Voltaren gel;   Pertinent review of Systems: No fevers or chills  Relevant historical information: Hypertension, osteopenia   Objective:    Vitals:   10/10/20 1128  BP: 130/84  Pulse: 67  SpO2: 97%   General: Well Developed, well nourished, and in no acute distress.   MSK: Left elbow area of purpura at the lateral elbow proximal to the area of pain otherwise normal-appearing Normal motion to flexion extension pronation and supination without pain.  Intact strength of flexion extension pronation and supination without pain.  No pain with resisted wrist extension and flexion pronation or supination. Tender palpation overlying lateral elbow and radial head and elbow joint line. Pulses cap refill and sensation are intact distally.  Lab and Radiology Results  X-ray images left elbow obtained today personally and independently interpreted Phleboliths present in soft tissue forearm.  No fractures or severe degenerative changes. Await formal radiology review  Diagnostic Limited MSK Ultrasound of: Left elbow lateral No soft tissue masses.  Area of tenderness is at radial head and medial elbow joint line. Small joint effusion present in this area.  Radial head is normal-appearing Largely normal appearing to ultrasound examination. Impression: Radial head and medial joint line or area of tenderness but are normal appearing  to ultrasound examination.  Small joint effusion present laterally.   Impression and Recommendations:    Assessment and Plan: 80 y.o. female with left lateral elbow pain at the radial head and lateral joint line.  Physical exam with exception of tenderness is largely normal-appearing as is ultrasound examination and x-ray.  Etiology of pain is unclear at this time.  Discussed options.  Plan to continue Voltaren gel.  Recommend compressive elbow sleeve.  Recheck in about a month.  If not better next step would be lab work-up to evaluate for rheumatologic cause or gout, and or injection along with potential MRI.Marland Kitchen  PDMP not reviewed this encounter. Orders Placed This Encounter  Procedures  . [place] LIMITED JOINT SPACE STRUCTURES UP LEFT(NO LINKED CHARGES)    Order Specific Question:   Reason for Exam (SYMPTOM  OR DIAGNOSIS REQUIRED)    Answer:   L forearm pain    Order Specific Question:   Preferred imaging location?    Answer:   [facility]  . DG ELBOW COMPLETE LEFT (3+VIEW)    Standing Status:   Future    Number of Occurrences:   1    Standing Expiration Date:   10/10/2021    Order Specific Question:   Reason for Exam (SYMPTOM  OR DIAGNOSIS REQUIRED)    Answer:   eval left elbow    Order Specific Question:   Preferred imaging location?    Answer:   [facility]   No orders of the defined types were placed in this encounter.   Discussed warning signs or symptoms. Please see  discharge instructions. Patient expresses understanding.   The above documentation has been reviewed and is accurate and complete Lynne Leader, M.D.

## 2020-10-10 ENCOUNTER — Ambulatory Visit: Payer: Medicare HMO | Admitting: Family Medicine

## 2020-10-10 ENCOUNTER — Ambulatory Visit (INDEPENDENT_AMBULATORY_CARE_PROVIDER_SITE_OTHER): Payer: Medicare HMO

## 2020-10-10 ENCOUNTER — Other Ambulatory Visit: Payer: Self-pay

## 2020-10-10 ENCOUNTER — Encounter: Payer: Self-pay | Admitting: Family Medicine

## 2020-10-10 ENCOUNTER — Ambulatory Visit: Payer: Self-pay

## 2020-10-10 VITALS — BP 130/84 | HR 67 | Ht 66.0 in | Wt 136.8 lb

## 2020-10-10 DIAGNOSIS — M25522 Pain in left elbow: Secondary | ICD-10-CM

## 2020-10-10 DIAGNOSIS — M79632 Pain in left forearm: Secondary | ICD-10-CM

## 2020-10-10 NOTE — Patient Instructions (Signed)
Thank you for coming in today.  Please get an Xray today before you leave  Please use voltaren gel up to 4x daily for pain as needed.   I recommend you obtained a compression sleeve to help with your joint problems. There are many options on the market however I recommend obtaining a Full Elbow Body Helix compression sleeve.  You can find information (including how to appropriate measure yourself for sizing) can be found at www.Body http://www.lambert.com/.  Many of these products are health savings account (HSA) eligible.   You can use the compression sleeve at any time throughout the day but is most important to use while being active as well as for 2 hours post-activity.   It is appropriate to ice following activity with the compression sleeve in place.  Recheck in 1 month.   If not better my plan is to do some labs and possibly a cortisone shot.

## 2020-10-11 NOTE — Progress Notes (Signed)
X-ray left elbow shows some dermal calcifications in the forearm soft tissue but otherwise looks normal.  I do not believe the calcifications are the cause of pain.  Continue current plan.

## 2020-10-17 ENCOUNTER — Ambulatory Visit: Payer: Medicare HMO | Admitting: Physical Therapy

## 2020-10-18 ENCOUNTER — Other Ambulatory Visit: Payer: Self-pay

## 2020-10-18 MED ORDER — TRUE METRIX METER W/DEVICE KIT: PACK | 3 refills | Status: DC

## 2020-11-02 ENCOUNTER — Ambulatory Visit: Payer: Medicare HMO | Admitting: Family Medicine

## 2020-11-09 ENCOUNTER — Ambulatory Visit: Payer: Medicare HMO | Admitting: Family Medicine

## 2020-12-13 NOTE — Patient Instructions (Addendum)
Please stop by lab before you go If you have mychart- we will send your results within 3 business days of [place] receiving them.  If you do not have mychart- we will call you about results within 5 business days of [place] receiving them.  *please also note that you will see labs on mychart as soon as they post. I will later go in and write notes on them- will say "notes from Dr. Yong Channel"  poor control of blood pressure now both at home and in office. Start amlodipine 2.5 mg and follow up in 1-2 months for recheck. She will let me know if home #s <110/60 or if lightheaded/dizzy on meds or other side effects   Immunizations 1. Please check with your pharmacy to see if they have the shingrix vaccine. If they do- please get this immunization and update [place] by phone call or mychart with dates you receive the vaccine 2. New pneumonia shot called prevnar 20 that may be cheaper at pharmacy- now formally recommended 3. Consider 4th covid immunization

## 2020-12-13 NOTE — Progress Notes (Signed)
Phone [phone]   Subjective:  Patient presents today for their annual physical. Chief complaint-noted.   See problem oriented charting- ROS- full  review of systems was completed and negative except for: tinnitus not changed, occasional LUQ pain, anxiety, trouble with sleep, decreaesd concentration, joint pain in knees  The following were reviewed and entered/updated in epic: Past Medical History:  Diagnosis Date  . Anxiety   . Arthritis    neck, back  . Hx of colonoscopy with polypectomy   . Hyperlipidemia    diet controlled  . 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 SVD (spontaneous vaginal delivery)    x 4  . Thyroid disease    Patient Active Problem List   Diagnosis Date Noted  . GERD (gastroesophageal reflux disease) 02/07/2020    Priority: Medium  . GAD (generalized anxiety disorder) 06/01/2018    Priority: Medium  . White coat syndrome without hypertension 06/01/2018    Priority: Medium  . Hypercholesterolemia 12/10/2011    Priority: Medium  . Hypothyroidism 12/10/2011    Priority: Medium  . Other and unspecified ovarian cyst 10/25/2013    Priority: Low  . Vaginal atrophy 06/03/2013    Priority: Low  . Hx of colonic polyps 12/10/2011    Priority: Low  . Osteopenia 10/03/2020  . Atypical chest pain 01/06/2020   Past Surgical History:  Procedure Laterality Date  . ABDOMINAL HYSTERECTOMY    . APPENDECTOMY    . BREAST SURGERY Right    LUMPECTOMY- FIBROID TUMOR benign  . COLONOSCOPY     hx polyps/ [facility]-mother   . ENTEROCELE REPAIR    . EXCISIONAL HEMORRHOIDECTOMY    . fnger surgery  04/2018  . POLYPECTOMY      Family History  Problem Relation Age of Onset  . Colon cancer Mother 51  . Anxiety disorder Mother   . Deep vein thrombosis Mother        died age 11  . Heart disease Father 33       smoker, high stress job  . Colon cancer Brother 8  . Colon cancer Maternal Aunt 23  . Colon cancer Son 44  . Colon cancer Paternal Grandmother   . Colon cancer  Other 32  . Hodgkin's lymphoma Brother 65  . Breast cancer Daughter   . Colon polyps Neg Hx   . Esophageal cancer Neg Hx   . Rectal cancer Neg Hx   . Stomach cancer Neg Hx     Medications- reviewed and updated Current Outpatient Medications  Medication Sig Dispense Refill  . ALPRAZolam (XANAX) 0.25 MG tablet Take 1-2 tablets (0.25-0.5 mg total) by mouth 2 (two) times daily as needed for anxiety. 120 tablet 4  . Blood Glucose Monitoring Suppl (TRUE METRIX METER) w/Device KIT Use to test blood sugars daily. Dx: e11.9 1 kit 3  . busPIRone (BUSPAR) 5 MG tablet Take 1 tablet (5 mg total) by mouth 3 (three) times daily. 270 tablet 3  . CALCIUM-MAGNESIUM-VITAMIN D PO Take 1 tablet by mouth daily.    . Cholecalciferol (VITAMIN D) 2000 UNITS tablet Take 2,000 Units by mouth daily.    . Cyanocobalamin (VITAMIN B-12 PO) Take by mouth.    . levothyroxine (SYNTHROID) 75 MCG tablet TAKE 1 TABLET EVERY DAY 90 tablet 3  . MAGNESIUM PO Take 1 tablet by mouth daily.    Marland Kitchen omeprazole (PRILOSEC) 40 MG capsule TAKE 1 CAPSULE EVERY DAY 90 capsule 1  . amLODipine (NORVASC) 2.5 MG tablet Take 1 tablet (2.5 mg total)  by mouth daily. 90 tablet 3  . aspirin EC 81 MG tablet Take 81 mg by mouth daily. Swallow whole. (Patient not taking: No sig reported)     No current facility-administered medications for this visit.    Allergies-reviewed and updated Allergies  Allergen Reactions  . Crab [Shellfish Allergy] Other (See Comments)    Severe stomach pains from crab  . Simvastatin Other (See Comments)    Social History   Social History Narrative   Married 61 years in 2019. 4 children, 3 living. 5 grandkids.    Poodle 12.5 in 2019.       Housewife.    Some college- [employer]      Hobbies: travel, walking, work out in yard   Objective  Objective:  BP (!) 150/82   Pulse 72   Temp (!) 97.2 F (36.2 C) (Temporal)   Ht $R'5\' 6"'NF$  (1.676 m)   Wt 136 lb 12.8 oz (62.1 kg)   LMP  (LMP Unknown)   SpO2 98%   BMI  22.08 kg/m  Gen: NAD, resting comfortably HEENT: Mucous membranes are moist. Oropharynx normal Neck: no thyromegaly CV: RRR no murmurs rubs or gallops Lungs: CTAB no crackles, wheeze, rhonchi Abdomen: soft/nontender/nondistended/normal bowel sounds. No rebound or guarding.  Ext: no edema Skin: warm, dry Neuro: grossly normal, moves all extremities, PERRLA   Assessment and Plan   80 y.o. female presenting for annual physical.  Health Maintenance counseling: 1. Anticipatory guidance: Patient counseled regarding regular dental exams - advised q6 months, eye exams - yearly,  avoiding smoking and second hand smoke , limiting alcohol to 1 beverage per day, no illicit drugs .   2. Risk factor reduction:  Advised patient of need for regular exercise and diet rich and fruits and vegetables to reduce risk of heart attack and stroke. Exercise- doing some walking and remaining active around the home. Diet-reasonably healthy- cooks at home and tries to avoid junk.  Wt Readings from Last 3 Encounters:  12/18/20 136 lb 12.8 oz (62.1 kg)  10/10/20 136 lb 12.8 oz (62.1 kg)  10/03/20 134 lb 3.2 oz (60.9 kg)  3. Immunizations/screenings/ancillary studies- discussed shingrix and prevnar 20 and 4th covid vaccine Immunization History  Administered Date(s) Administered  . Fluad Quad(high Dose 65+) 05/14/2019, 06/13/2020  . Influenza, High Dose Seasonal PF 07/25/2014, 05/25/2015, 06/26/2016, 06/17/2017, 06/01/2018  . Influenza,inj,Quad PF,6+ Mos 06/03/2013  . Moderna Sars-Covid-2 Vaccination 09/24/2019, 10/22/2019, 07/10/2020  . Pneumococcal Conjugate-13 07/25/2014  . Pneumococcal Polysaccharide-23 07/04/2011  . Tetanus 07/19/2013  4. Cervical cancer screening-past age based screening recommendations.  No vaginal bleeding or discharge 5. Breast cancer screening-  breast exam - self exams no concerns- and mammogram still with GYN 6. Colon cancer screening - 02/22/2019 and was told no further colonoscopy by  Dr. Henrene Pastor- see letter 7. Skin cancer screening- follows with dermatology. advised regular sunscreen use. Denies worrisome, changing, or new skin lesions.  8. Birth control/STD check- postmenopause and only active with husband 49. Osteoporosis screening at 46- reports slight osteopenia- followed by [facility] -former smoker quit 1980- no regular screening required.   Status of chronic or acute concerns   #hyperlipidemia S: Medication: Statin intolerant in the past- depressed mood on simvastatin 10 mg.  He has also wanted to work on lifestyle.  Prefers to remain on aspirin for primary prevention since cannot tolerate statin. Lab Results  Component Value Date   CHOL 225 (H) 12/13/2019   HDL 49.10 12/13/2019   LDLCALC 152 (H) 12/13/2019   LDLDIRECT  143.0 02/23/2018   TRIG 115.0 12/13/2019   CHOLHDL 5 12/13/2019   A/P: cholesterol has been high- will update levels today. Unlikely to try new med with prior side effects for primary prevention at her age  #hypertension-see separate note- new diagnosis/poor control   #hypothyroidism S: compliant On thyroid medication-Levothyroxine 69mcg A/P:hopefully stable- update tsh   #GAD/depression anxiety S: Medication:alprazolam 0.25 mg 3-4 times a day, buspirone increased to 3 times a day last visit- mild help -Had recommended therapy/counseling in the past and patient have declined -Still strongly prefer patient to be on SSRI- on zoloft felt run down and actually worsened depression A/P: GAD stable- would prefer SSRI trial again but with prior side effect will hold off for now- consider lexapro potentially in the future   #GERD S: Medication: Omeprazole 40 mg  Still gets some intermittent LUQ pain- previously had epigastric and RUQ pain- that is much better. Current pain maybe twice a week few seconds and can turn and improe A/P: GERD appears well controlled-could consider reducing dose in the future.  In regards to the left upper quadrant pain-she will  let us know if this worsens-with it being so intermittent and short-lived I think we can monitor for now-gets better with changes in position -remains on b12  #constipation- metamucil helps but not using regularly.  Recommended follow up: Return in about 1 month (around 01/17/2021) for follow up- or sooner if needed.  Lab/Order associations: Not fasting   ICD-10-CM   1. Encounter for general adult medical examination with abnormal findings  Z00.01   2. Acquired hypothyroidism  E03.9 TSH  3. GAD (generalized anxiety disorder)  F41.1   4. Primary hypertension  I10 CBC with Differential/Platelet    Comprehensive metabolic panel  5. Hypercholesterolemia  E78.00 Lipid panel    Meds ordered this encounter  Medications  . DISCONTD: amLODipine (NORVASC) 2.5 MG tablet    Sig: Take 1 tablet (2.5 mg total) by mouth daily.    Dispense:  90 tablet    Refill:  3  . amLODipine (NORVASC) 2.5 MG tablet    Sig: Take 1 tablet (2.5 mg total) by mouth daily.    Dispense:  90 tablet    Refill:  3    Return precautions advised.  Garret Reddish, MD

## 2020-12-15 ENCOUNTER — Telehealth: Payer: Self-pay | Admitting: Family Medicine

## 2020-12-15 NOTE — Chronic Care Management (AMB) (Signed)
  Chronic Care Management   Note  12/15/2020 Name: MONYA KOZAKIEWICZ MRN: [id number] DOB: 04-28-1941  PERSEPHONE SCHRIEVER is a 80 y.o. year old female who is a primary care patient of Marin Olp, MD. I reached out to Joylene Grapes by phone today in response to a referral sent by Ms. Jaynie Bream Binns's PCP, Marin Olp, MD.   Ms. Modesitt was given information about Chronic Care Management services today including:  1. CCM service includes personalized support from designated clinical staff supervised by her physician, including individualized plan of care and coordination with other care providers 2. 24/7 contact phone numbers for assistance for urgent and routine care needs. 3. Service will only be billed when office clinical staff spend 20 minutes or more in a month to coordinate care. 4. Only one practitioner may furnish and bill the service in a calendar month. 5. The patient may stop CCM services at any time (effective at the end of the month) by phone call to the office staff.   Patient did not agree to enrollment in care management services and does not wish to consider at this time.  Follow up plan:   Lauretta Grill [facility] Scheduler

## 2020-12-18 ENCOUNTER — Ambulatory Visit (INDEPENDENT_AMBULATORY_CARE_PROVIDER_SITE_OTHER): Payer: Medicare HMO | Admitting: Family Medicine

## 2020-12-18 ENCOUNTER — Encounter: Payer: Self-pay | Admitting: Family Medicine

## 2020-12-18 ENCOUNTER — Other Ambulatory Visit: Payer: Self-pay

## 2020-12-18 VITALS — BP 150/82 | HR 72 | Temp 97.2°F | Ht 66.0 in | Wt 136.8 lb

## 2020-12-18 DIAGNOSIS — E7800 Pure hypercholesterolemia, unspecified: Secondary | ICD-10-CM

## 2020-12-18 DIAGNOSIS — Z0001 Encounter for general adult medical examination with abnormal findings: Secondary | ICD-10-CM

## 2020-12-18 DIAGNOSIS — E039 Hypothyroidism, unspecified: Secondary | ICD-10-CM | POA: Diagnosis not present

## 2020-12-18 DIAGNOSIS — F411 Generalized anxiety disorder: Secondary | ICD-10-CM | POA: Diagnosis not present

## 2020-12-18 DIAGNOSIS — I10 Essential (primary) hypertension: Secondary | ICD-10-CM

## 2020-12-18 DIAGNOSIS — Z0000 Encounter for general adult medical examination without abnormal findings: Secondary | ICD-10-CM

## 2020-12-18 MED ORDER — AMLODIPINE BESYLATE 2.5 MG PO TABS: 2.5000 mg | ORAL_TABLET | Freq: Every day | ORAL | 3 refills | Status: DC

## 2020-12-18 NOTE — Progress Notes (Signed)
Phone [phone] In person visit   Subjective:   [name] is a 80 y.o. year old very pleasant female patient who presents for/with See problem oriented charting  This visit occurred during the SARS-CoV-2 public health emergency.  Safety protocols were in place, including screening questions prior to the visit, additional usage of staff PPE, and extensive cleaning of exam room while observing appropriate contact time as indicated for disinfecting solutions.   Past Medical History-  Patient Active Problem List   Diagnosis Date Noted  . GERD (gastroesophageal reflux disease) 02/07/2020    Priority: Medium  . GAD (generalized anxiety disorder) 06/01/2018    Priority: Medium  . Essential hypertension 06/01/2018    Priority: Medium  . Hypercholesterolemia 12/10/2011    Priority: Medium  . Hypothyroidism 12/10/2011    Priority: Medium  . Other and unspecified ovarian cyst 10/25/2013    Priority: Low  . Vaginal atrophy 06/03/2013    Priority: Low  . Hx of colonic polyps 12/10/2011    Priority: Low  . Osteopenia 10/03/2020  . Atypical chest pain 01/06/2020    Medications- reviewed and updated Current Outpatient Medications  Medication Sig Dispense Refill  . ALPRAZolam (XANAX) 0.25 MG tablet Take 1-2 tablets (0.25-0.5 mg total) by mouth 2 (two) times daily as needed for anxiety. 120 tablet 4  . Blood Glucose Monitoring Suppl (TRUE METRIX METER) w/Device KIT Use to test blood sugars daily. Dx: e11.9 1 kit 3  . busPIRone (BUSPAR) 5 MG tablet Take 1 tablet (5 mg total) by mouth 3 (three) times daily. 270 tablet 3  . CALCIUM-MAGNESIUM-VITAMIN D PO Take 1 tablet by mouth daily.    . Cholecalciferol (VITAMIN D) 2000 UNITS tablet Take 2,000 Units by mouth daily.    . Cyanocobalamin (VITAMIN B-12 PO) Take by mouth.    . levothyroxine (SYNTHROID) 75 MCG tablet TAKE 1 TABLET EVERY DAY 90 tablet 3  . MAGNESIUM PO Take 1 tablet by mouth daily.    Marland Kitchen omeprazole (PRILOSEC) 40 MG capsule  TAKE 1 CAPSULE EVERY DAY 90 capsule 1  . amLODipine (NORVASC) 2.5 MG tablet Take 1 tablet (2.5 mg total) by mouth daily. 90 tablet 3  . aspirin EC 81 MG tablet Take 81 mg by mouth daily. Swallow whole. (Patient not taking: No sig reported)     No current facility-administered medications for this visit.     Objective:  BP (!) 150/82   Pulse 72   Temp (!) 97.2 F (36.2 C) (Temporal)   Ht 5' 6" (1.676 m)   Wt 136 lb 12.8 oz (62.1 kg)   LMP  (LMP Unknown)   SpO2 98%   BMI 22.08 kg/m  Gen: NAD, resting comfortably    Assessment and Plan    Essential hypertension #hypertension-new diagnosis 12/18/2020 with history of whitecoat hypertension S: medication: amlodipine 2.5 mg Home readings #s: previously home readings were well controlled. Now multiple readings over 140 and even 150. Average certainly above 195 though diastolic # controlled for most part A/P: poor control of blood pressure now both at home and in office. Start amlodipine 2.5 mg and follow up in 1-2 months for recheck. She will let me know if home #s <110/60 or if lightheaded/dizzy on meds or other side effects     Recommended follow up: Return in about 1 month (around 01/17/2021) for follow up- or sooner if needed.   Lab/Order associations:   ICD-10-CM   1. Essential hypertension  I10     Meds ordered this  encounter  Medications  . DISCONTD: amLODipine (NORVASC) 2.5 MG tablet    Sig: Take 1 tablet (2.5 mg total) by mouth daily.    Dispense:  90 tablet    Refill:  3  . amLODipine (NORVASC) 2.5 MG tablet    Sig: Take 1 tablet (2.5 mg total) by mouth daily.    Dispense:  90 tablet    Refill:  3  sent to mail order per preference  Return precautions advised.  Garret Reddish, MD

## 2020-12-18 NOTE — Assessment & Plan Note (Signed)
#  hypertension-new diagnosis 12/18/2020 with history of whitecoat hypertension S: medication: amlodipine 2.5 mg Home readings #s: previously home readings were well controlled. Now multiple readings over 140 and even 150. Average certainly above 218 though diastolic # controlled for most part A/P: poor control of blood pressure now both at home and in office. Start amlodipine 2.5 mg and follow up in 1-2 months for recheck. She will let me know if home #s <110/60 or if lightheaded/dizzy on meds or other side effects

## 2020-12-19 LAB — COMPREHENSIVE METABOLIC PANEL
ALT: 13 U/L (ref 0–35)
AST: 19 U/L (ref 0–37)
Albumin: 3.9 g/dL (ref 3.5–5.2)
Alkaline Phosphatase: 69 U/L (ref 39–117)
BUN: 14 mg/dL (ref 6–23)
CO2: 28 mEq/L (ref 19–32)
Calcium: 9.2 mg/dL (ref 8.4–10.5)
Chloride: 100 mEq/L (ref 96–112)
Creatinine, Ser: 0.87 mg/dL (ref 0.40–1.20)
GFR: 62.56 mL/min (ref >60.00)
Glucose, Bld: 78 mg/dL (ref 70–99)
Potassium: 4.1 mEq/L (ref 3.5–5.1)
Sodium: 135 mEq/L (ref 135–145)
Total Bilirubin: 0.4 mg/dL (ref 0.2–1.2)
Total Protein: 6.8 g/dL (ref 6.0–8.3)

## 2020-12-19 LAB — CBC WITH DIFFERENTIAL/PLATELET
Basophils Absolute: 0.1 10*3/uL (ref 0.0–0.1)
Basophils Relative: 0.9 % (ref 0.0–3.0)
Eosinophils Absolute: 0.6 10*3/uL (ref 0.0–0.7)
Eosinophils Relative: 8.3 % — ABNORMAL HIGH (ref 0.0–5.0)
HCT: 41.3 % (ref 36.0–46.0)
Hemoglobin: 14.1 g/dL (ref 12.0–15.0)
Lymphocytes Relative: 23.7 % (ref 12.0–46.0)
Lymphs Abs: 1.7 10*3/uL (ref 0.7–4.0)
MCHC: 34.1 g/dL (ref 30.0–36.0)
MCV: 89.4 fl (ref 78.0–100.0)
Monocytes Absolute: 0.4 10*3/uL (ref 0.1–1.0)
Monocytes Relative: 6.1 % (ref 3.0–12.0)
Neutro Abs: 4.3 10*3/uL (ref 1.4–7.7)
Neutrophils Relative %: 61 % (ref 43.0–77.0)
Platelets: 216 10*3/uL (ref 150.0–400.0)
RBC: 4.61 Mil/uL (ref 3.87–5.11)
RDW: 12.9 % (ref 11.5–15.5)
WBC: 7 10*3/uL (ref 4.0–10.5)

## 2020-12-19 LAB — LIPID PANEL
Cholesterol: 205 mg/dL — ABNORMAL HIGH (ref 0–200)
HDL: 51.1 mg/dL (ref >39.00)
LDL Cholesterol: 114 mg/dL — ABNORMAL HIGH (ref 0–99)
NonHDL: 153.87
Total CHOL/HDL Ratio: 4
Triglycerides: 198 mg/dL — ABNORMAL HIGH (ref 0.0–149.0)
VLDL: 39.6 mg/dL (ref 0.0–40.0)

## 2020-12-19 LAB — TSH: TSH: 0.67 u[IU]/mL (ref 0.35–4.50)

## 2020-12-22 ENCOUNTER — Other Ambulatory Visit: Payer: Self-pay | Admitting: Family Medicine

## 2021-03-08 ENCOUNTER — Other Ambulatory Visit: Payer: Self-pay

## 2021-03-08 ENCOUNTER — Encounter: Payer: Self-pay | Admitting: Family Medicine

## 2021-03-08 ENCOUNTER — Ambulatory Visit (INDEPENDENT_AMBULATORY_CARE_PROVIDER_SITE_OTHER): Payer: Medicare HMO | Admitting: Family Medicine

## 2021-03-08 VITALS — BP 124/71 | HR 83 | Temp 98.2°F | Ht 66.0 in | Wt 136.8 lb

## 2021-03-08 DIAGNOSIS — I10 Essential (primary) hypertension: Secondary | ICD-10-CM | POA: Diagnosis not present

## 2021-03-08 DIAGNOSIS — S80869A Insect bite (nonvenomous), unspecified lower leg, initial encounter: Secondary | ICD-10-CM | POA: Diagnosis not present

## 2021-03-08 DIAGNOSIS — S80862A Insect bite (nonvenomous), left lower leg, initial encounter: Secondary | ICD-10-CM

## 2021-03-08 DIAGNOSIS — W57XXXA Bitten or stung by nonvenomous insect and other nonvenomous arthropods, initial encounter: Secondary | ICD-10-CM | POA: Diagnosis not present

## 2021-03-08 DIAGNOSIS — Z23 Encounter for immunization: Secondary | ICD-10-CM

## 2021-03-08 MED ORDER — BUSPIRONE HCL 5 MG PO TABS: 5.0000 mg | ORAL_TABLET | Freq: Three times a day (TID) | ORAL | 3 refills | Status: DC

## 2021-03-08 NOTE — Progress Notes (Signed)
Phone [phone] In person visit   Subjective:   [name] is a 80 y.o. year old very pleasant female patient who presents for/with See problem oriented charting Chief Complaint  Patient presents with   Hypertension    This visit occurred during the SARS-CoV-2 public health emergency.  Safety protocols were in place, including screening questions prior to the visit, additional usage of staff PPE, and extensive cleaning of exam room while observing appropriate contact time as indicated for disinfecting solutions.   Past Medical History-  Patient Active Problem List   Diagnosis Date Noted   GERD (gastroesophageal reflux disease) 02/07/2020    Priority: Medium   GAD (generalized anxiety disorder) 06/01/2018    Priority: Medium   Essential hypertension 06/01/2018    Priority: Medium   Hypercholesterolemia 12/10/2011    Priority: Medium   Hypothyroidism 12/10/2011    Priority: Medium   Other and unspecified ovarian cyst 10/25/2013    Priority: Low   Vaginal atrophy 06/03/2013    Priority: Low   Hx of colonic polyps 12/10/2011    Priority: Low   Osteopenia 10/03/2020   Atypical chest pain 01/06/2020    Medications- reviewed and updated Current Outpatient Medications  Medication Sig Dispense Refill   ALPRAZolam (XANAX) 0.25 MG tablet TAKE 1 TO 2 TABLETS TWICE DAILY AS NEEDED FOR ANXIETY 120 tablet 1   amLODipine (NORVASC) 2.5 MG tablet Take 1 tablet (2.5 mg total) by mouth daily. 90 tablet 3   busPIRone (BUSPAR) 5 MG tablet Take 1 tablet (5 mg total) by mouth 3 (three) times daily. 270 tablet 3   Cholecalciferol (VITAMIN D3 PO) Take by mouth.     Cyanocobalamin (VITAMIN B-12 PO) Take by mouth.     levothyroxine (SYNTHROID) 75 MCG tablet TAKE 1 TABLET EVERY DAY 90 tablet 3   MAGNESIUM PO Take 1 tablet by mouth daily.     omeprazole (PRILOSEC) 40 MG capsule TAKE 1 CAPSULE EVERY DAY 90 capsule 1   No current facility-administered medications for this visit.      Objective:  BP 124/71 Comment: most recent home reading  Pulse 83   Temp 98.2 F (36.8 C) (Temporal)   Ht 5\' 6"  (1.676 m)   Wt 136 lb 12.8 oz (62.1 kg)   LMP  (LMP Unknown)   SpO2 97%   BMI 22.08 kg/m  Gen: NAD, resting comfortably CV: RRR no murmurs rubs or gallops Lungs: CTAB no crackles, wheeze, rhonchi Abdomen: soft/nontender/nondistended/normal bowel sounds. No rebound or guarding.  Ext: trace edema Skin: warm, dry, on left leg lateral portoin of thigh there is 1.5 x 1.5 cm indurated area- not painful to palpation    Assessment and Plan   #hypertension-new diagnosis 12/18/2020 with history of whitecoat hypertension S: medication: amlodipine 2.5 mg started last visit  Home reading #s: last vist multiple readings over 140 and even 150. Since starting medicine average certainly under 366/44. One reading at 155 but all other readings 137 or below with several readings in 120s.  BP Readings from Last 3 Encounters:  03/08/21 124/71  12/18/20 (!) 150/82  10/10/20 130/84  A/P: Excellent control since starting amlodipine 2.5 mg on her home readings.  Still has whitecoat hypertension in office but with excellent home control we will continue current medicine  #Bug bite on left leg- husband with brown recluse bite in the past so she was worried- has 1.5 x 1.5 cm indurated area- very itchy. She will monitor and if progressive will let me know -  Tdap will be due in 2 years- with bite recommend going ahead and updating today- she is in agreement Immunization History  Administered Date(s) Administered   Fluad Quad(high Dose 65+) 05/14/2019, 06/13/2020   Influenza, High Dose Seasonal PF 07/25/2014, 05/25/2015, 06/26/2016, 06/17/2017, 06/01/2018   Influenza,inj,Quad PF,6+ Mos 06/03/2013   Moderna Sars-Covid-2 Vaccination 09/24/2019, 10/22/2019, 07/10/2020   Pneumococcal Conjugate-13 07/25/2014   Pneumococcal Polysaccharide-23 07/04/2011   Tetanus 07/19/2013   Recommended follow up:   1 year cpe as long as BP looks good at home  Lab/Order associations:   ICD-10-CM   1. Essential hypertension  I10     2. Insect bite of left lower extremity, initial encounter  Y51.102T    W57.Merril Abbe      I,Harris Phan,acting as a Education administrator for Garret Reddish, MD.,have documented all relevant documentation on the behalf of Garret Reddish, MD,as directed by  Garret Reddish, MD while in the presence of Garret Reddish, MD.  I, Garret Reddish, MD, have reviewed all documentation for this visit. The documentation on 03/08/21 for the exam, diagnosis, procedures, and orders are all accurate and complete.  Return precautions advised.  Garret Reddish, MD

## 2021-03-08 NOTE — Addendum Note (Signed)
Addended by: Linton Ham on: 03/08/2021 04:30 PM   Modules accepted: Orders

## 2021-03-08 NOTE — Patient Instructions (Addendum)
Health Maintenance Due  Topic Date Due   Zoster Vaccines- Shingrix (1 of 2) Please check with your pharmacy to see if they have the shingrix vaccine. If they do- please get this immunization and update [place] by phone call or mychart with dates you receive the vaccine  Never done   COVID-19 Vaccine (4 - Booster for [facility] series) Patient will get this scheduled for the fall. Can also go ahead and do this now 10/10/2020   [facility] [place] TD-left leg under insect bite. -For your insect bite on your left leg, please monitor the area and if it worsens please call and let us know.  Recommended follow up: Return in about 1 year (around 03/08/2022) for physical -  as long as blood pressure looks good at home.

## 2021-03-18 ENCOUNTER — Other Ambulatory Visit: Payer: Self-pay | Admitting: Family Medicine

## 2021-04-10 ENCOUNTER — Other Ambulatory Visit: Payer: Self-pay

## 2021-04-10 NOTE — Telephone Encounter (Signed)
  Encourage patient to contact the pharmacy for refills or they can request refills through [facility]:  03/08/2021  NEXT APPOINTMENT DATE:  MEDICATION: ALPRAZolam (XANAX) 0.25 MG tablet  Is the patient out of medication? No  PHARMACY: [facility] - [place], [place]  Let patient know to contact pharmacy at the end of the day to make sure medication is ready.  Please notify patient to allow 48-72 hours to process

## 2021-04-11 MED ORDER — ALPRAZOLAM 0.25 MG PO TABS: ORAL_TABLET | ORAL | 1 refills | Status: DC

## 2021-05-23 ENCOUNTER — Other Ambulatory Visit: Payer: Self-pay | Admitting: Family Medicine

## 2021-06-26 ENCOUNTER — Ambulatory Visit: Payer: Medicare HMO

## 2021-06-27 ENCOUNTER — Other Ambulatory Visit: Payer: Self-pay

## 2021-06-27 ENCOUNTER — Ambulatory Visit (INDEPENDENT_AMBULATORY_CARE_PROVIDER_SITE_OTHER): Payer: Medicare HMO

## 2021-06-27 DIAGNOSIS — Z23 Encounter for immunization: Secondary | ICD-10-CM

## 2021-08-10 ENCOUNTER — Telehealth: Payer: Self-pay

## 2021-08-10 ENCOUNTER — Other Ambulatory Visit: Payer: Self-pay | Admitting: Family Medicine

## 2021-08-10 MED ORDER — AMLODIPINE BESYLATE 2.5 MG PO TABS: 2.5000 mg | ORAL_TABLET | Freq: Every day | ORAL | 3 refills | Status: DC

## 2021-08-10 NOTE — Telephone Encounter (Signed)
Refill sent to pharmacy.   

## 2021-08-10 NOTE — Telephone Encounter (Signed)
LAST APPOINTMENT DATE:  03/08/21  NEXT APPOINTMENT DATE: none  MEDICATION:amLODipine (NORVASC) 2.5 MG tablet  PHARMACY: [place], [place]

## 2021-08-13 ENCOUNTER — Telehealth: Payer: Self-pay

## 2021-08-13 MED ORDER — OMEPRAZOLE 40 MG PO CPDR
40.0000 mg | DELAYED_RELEASE_CAPSULE | Freq: Every day | ORAL | 1 refills | Status: DC
Start: 2021-08-13 — End: 2022-01-01

## 2021-08-13 NOTE — Telephone Encounter (Signed)
LAST APPOINTMENT DATE:  03/08/21  NEXT APPOINTMENT DATE: none  MEDICATION:ALPRAZolam (XANAX) 0.25 MG tablet  omeprazole (PRILOSEC) 40 MG capsule    PHARMACY: [place], [place]

## 2021-08-14 NOTE — Telephone Encounter (Signed)
Was this not filled 08/13/21?

## 2021-10-09 DIAGNOSIS — H2513 Age-related nuclear cataract, bilateral: Secondary | ICD-10-CM | POA: Diagnosis not present

## 2021-10-09 DIAGNOSIS — H04123 Dry eye syndrome of bilateral lacrimal glands: Secondary | ICD-10-CM | POA: Diagnosis not present

## 2021-10-09 DIAGNOSIS — H25813 Combined forms of age-related cataract, bilateral: Secondary | ICD-10-CM | POA: Diagnosis not present

## 2021-10-09 DIAGNOSIS — H35372 Puckering of macula, left eye: Secondary | ICD-10-CM | POA: Diagnosis not present

## 2021-10-09 DIAGNOSIS — H524 Presbyopia: Secondary | ICD-10-CM | POA: Diagnosis not present

## 2021-10-09 DIAGNOSIS — H5203 Hypermetropia, bilateral: Secondary | ICD-10-CM | POA: Diagnosis not present

## 2021-12-11 ENCOUNTER — Telehealth: Payer: Self-pay | Admitting: Family Medicine

## 2021-12-11 NOTE — Telephone Encounter (Signed)
Pt states he BP systolic is going up to 111 and 150. She is wanting to know if she can increase her Amlodipine to help this. Please advise ?

## 2021-12-12 NOTE — Telephone Encounter (Signed)
Yes she can increase to '5mg'$  daily though she needs to schedule a follow up appointment ASAP. ? ?Algis Greenhouse. Jerline Pain, MD ?12/12/2021 10:36 AM  ? ?

## 2021-12-12 NOTE — Telephone Encounter (Signed)
Can you advise in Dr. Ansel Bong absence? ?

## 2021-12-13 NOTE — Telephone Encounter (Signed)
Spoke with patient, advise per Dr Lelon Huh to incresed Rx Amlodipine to '5mg'$  daily, F/U appointment schedule  ?

## 2021-12-22 ENCOUNTER — Other Ambulatory Visit: Payer: Self-pay | Admitting: Family Medicine

## 2022-01-01 ENCOUNTER — Emergency Department (HOSPITAL_COMMUNITY): Payer: Medicare HMO

## 2022-01-01 ENCOUNTER — Observation Stay (HOSPITAL_COMMUNITY): Payer: Medicare HMO

## 2022-01-01 ENCOUNTER — Encounter (HOSPITAL_COMMUNITY): Payer: Self-pay | Admitting: Emergency Medicine

## 2022-01-01 ENCOUNTER — Observation Stay (HOSPITAL_COMMUNITY)
Admission: EM | Admit: 2022-01-01 | Discharge: 2022-01-02 | Disposition: A | Discharge status: Home / Self Care | Payer: Medicare HMO | Attending: Family Medicine | Admitting: Family Medicine

## 2022-01-01 ENCOUNTER — Other Ambulatory Visit: Payer: Self-pay

## 2022-01-01 DIAGNOSIS — Z87891 Personal history of nicotine dependence: Secondary | ICD-10-CM | POA: Insufficient documentation

## 2022-01-01 DIAGNOSIS — K219 Gastro-esophageal reflux disease without esophagitis: Secondary | ICD-10-CM | POA: Diagnosis present

## 2022-01-01 DIAGNOSIS — I10 Essential (primary) hypertension: Secondary | ICD-10-CM | POA: Diagnosis not present

## 2022-01-01 DIAGNOSIS — I6611 Occlusion and stenosis of right anterior cerebral artery: Secondary | ICD-10-CM | POA: Diagnosis not present

## 2022-01-01 DIAGNOSIS — I6623 Occlusion and stenosis of bilateral posterior cerebral arteries: Secondary | ICD-10-CM | POA: Diagnosis not present

## 2022-01-01 DIAGNOSIS — I639 Cerebral infarction, unspecified: Secondary | ICD-10-CM | POA: Diagnosis not present

## 2022-01-01 DIAGNOSIS — I6502 Occlusion and stenosis of left vertebral artery: Secondary | ICD-10-CM | POA: Diagnosis not present

## 2022-01-01 DIAGNOSIS — G459 Transient cerebral ischemic attack, unspecified: Principal | ICD-10-CM | POA: Insufficient documentation

## 2022-01-01 DIAGNOSIS — I6523 Occlusion and stenosis of bilateral carotid arteries: Secondary | ICD-10-CM | POA: Diagnosis not present

## 2022-01-01 DIAGNOSIS — F411 Generalized anxiety disorder: Secondary | ICD-10-CM | POA: Diagnosis present

## 2022-01-01 DIAGNOSIS — R2681 Unsteadiness on feet: Secondary | ICD-10-CM | POA: Insufficient documentation

## 2022-01-01 DIAGNOSIS — I6782 Cerebral ischemia: Secondary | ICD-10-CM | POA: Diagnosis not present

## 2022-01-01 DIAGNOSIS — Z79899 Other long term (current) drug therapy: Secondary | ICD-10-CM | POA: Insufficient documentation

## 2022-01-01 DIAGNOSIS — G4489 Other headache syndrome: Secondary | ICD-10-CM | POA: Diagnosis not present

## 2022-01-01 DIAGNOSIS — Z8673 Personal history of transient ischemic attack (TIA), and cerebral infarction without residual deficits: Secondary | ICD-10-CM | POA: Diagnosis present

## 2022-01-01 DIAGNOSIS — I651 Occlusion and stenosis of basilar artery: Secondary | ICD-10-CM | POA: Diagnosis not present

## 2022-01-01 DIAGNOSIS — E039 Hypothyroidism, unspecified: Secondary | ICD-10-CM | POA: Diagnosis not present

## 2022-01-01 DIAGNOSIS — I672 Cerebral atherosclerosis: Secondary | ICD-10-CM | POA: Diagnosis not present

## 2022-01-01 DIAGNOSIS — R531 Weakness: Secondary | ICD-10-CM | POA: Diagnosis not present

## 2022-01-01 DIAGNOSIS — E7800 Pure hypercholesterolemia, unspecified: Secondary | ICD-10-CM | POA: Diagnosis present

## 2022-01-01 DIAGNOSIS — R29818 Other symptoms and signs involving the nervous system: Secondary | ICD-10-CM | POA: Diagnosis not present

## 2022-01-01 LAB — CBC
HCT: 41.7 % (ref 36.0–46.0)
Hemoglobin: 13.9 g/dL (ref 12.0–15.0)
MCH: 30.8 pg (ref 26.0–34.0)
MCHC: 33.3 g/dL (ref 30.0–36.0)
MCV: 92.3 fL (ref 80.0–100.0)
Platelets: 185 10*3/uL (ref 150–400)
RBC: 4.52 MIL/uL (ref 3.87–5.11)
RDW: 12.2 % (ref 11.5–15.5)
WBC: 6.7 10*3/uL (ref 4.0–10.5)
nRBC: 0 % (ref 0.0–0.2)

## 2022-01-01 LAB — I-STAT CHEM 8, ED
BUN: 14 mg/dL (ref 8–23)
Calcium, Ion: 1.16 mmol/L (ref 1.15–1.40)
Chloride: 100 mmol/L (ref 98–111)
Creatinine, Ser: 0.9 mg/dL (ref 0.44–1.00)
Glucose, Bld: 89 mg/dL (ref 70–99)
HCT: 41 % (ref 36.0–46.0)
Hemoglobin: 13.9 g/dL (ref 12.0–15.0)
Potassium: 4.3 mmol/L (ref 3.5–5.1)
Sodium: 135 mmol/L (ref 135–145)
TCO2: 27 mmol/L (ref 22–32)

## 2022-01-01 LAB — COMPREHENSIVE METABOLIC PANEL
ALT: 15 U/L (ref 0–44)
AST: 24 U/L (ref 15–41)
Albumin: 3.9 g/dL (ref 3.5–5.0)
Alkaline Phosphatase: 59 U/L (ref 38–126)
Anion gap: 6 (ref 5–15)
BUN: 13 mg/dL (ref 8–23)
CO2: 26 mmol/L (ref 22–32)
Calcium: 9.2 mg/dL (ref 8.9–10.3)
Chloride: 104 mmol/L (ref 98–111)
Creatinine, Ser: 0.96 mg/dL (ref 0.44–1.00)
GFR, Estimated: 59 mL/min — ABNORMAL LOW (ref >60)
Glucose, Bld: 93 mg/dL (ref 70–99)
Potassium: 4.3 mmol/L (ref 3.5–5.1)
Sodium: 136 mmol/L (ref 135–145)
Total Bilirubin: 0.5 mg/dL (ref 0.3–1.2)
Total Protein: 6.6 g/dL (ref 6.5–8.1)

## 2022-01-01 LAB — DIFFERENTIAL
Abs Immature Granulocytes: 0.02 10*3/uL (ref 0.00–0.07)
Basophils Absolute: 0 10*3/uL (ref 0.0–0.1)
Basophils Relative: 1 %
Eosinophils Absolute: 0.1 10*3/uL (ref 0.0–0.5)
Eosinophils Relative: 1 %
Immature Granulocytes: 0 %
Lymphocytes Relative: 31 %
Lymphs Abs: 2.1 10*3/uL (ref 0.7–4.0)
Monocytes Absolute: 0.5 10*3/uL (ref 0.1–1.0)
Monocytes Relative: 7 %
Neutro Abs: 4 10*3/uL (ref 1.7–7.7)
Neutrophils Relative %: 60 %

## 2022-01-01 LAB — PROTIME-INR
INR: 1 (ref 0.8–1.2)
Prothrombin Time: 12.8 seconds (ref 11.4–15.2)

## 2022-01-01 LAB — CBG MONITORING, ED: Glucose-Capillary: 94 mg/dL (ref 70–99)

## 2022-01-01 LAB — HEMOGLOBIN A1C
Hgb A1c MFr Bld: 5.2 % (ref 4.8–5.6)
Mean Plasma Glucose: 102.54 mg/dL

## 2022-01-01 LAB — APTT: aPTT: 27 seconds (ref 24–36)

## 2022-01-01 LAB — TSH: TSH: 0.654 u[IU]/mL (ref 0.350–4.500)

## 2022-01-01 MED ORDER — BUSPIRONE HCL 10 MG PO TABS
5.0000 mg | ORAL_TABLET | Freq: Three times a day (TID) | ORAL | Status: DC
  Administered 2022-01-01 – 2022-01-02 (×3): 5 mg via ORAL
  Filled 2022-01-01 (×3): qty 1

## 2022-01-01 MED ORDER — ASPIRIN EC 325 MG PO TBEC
325.0000 mg | DELAYED_RELEASE_TABLET | Freq: Once | ORAL | Status: AC
  Administered 2022-01-01: 325 mg via ORAL
  Filled 2022-01-01: qty 1

## 2022-01-01 MED ORDER — SODIUM CHLORIDE 0.9% FLUSH
3.0000 mL | Freq: Once | INTRAVENOUS | Status: AC
  Administered 2022-01-01: 3 mL via INTRAVENOUS

## 2022-01-01 MED ORDER — CLOPIDOGREL BISULFATE 300 MG PO TABS
300.0000 mg | ORAL_TABLET | Freq: Once | ORAL | Status: AC
Start: 2022-01-01 — End: 2022-01-01
  Administered 2022-01-01: 300 mg via ORAL
  Filled 2022-01-01: qty 1

## 2022-01-01 MED ORDER — ASPIRIN EC 81 MG PO TBEC
81.0000 mg | DELAYED_RELEASE_TABLET | Freq: Every day | ORAL | Status: DC
  Administered 2022-01-02: 81 mg via ORAL
  Filled 2022-01-01: qty 1

## 2022-01-01 MED ORDER — IOHEXOL 350 MG/ML SOLN
50.0000 mL | Freq: Once | INTRAVENOUS | Status: AC | PRN
  Administered 2022-01-01: 50 mL via INTRAVENOUS

## 2022-01-01 MED ORDER — ALPRAZOLAM 0.25 MG PO TABS
0.2500 mg | ORAL_TABLET | Freq: Two times a day (BID) | ORAL | Status: DC | PRN
  Administered 2022-01-01: 0.25 mg via ORAL
  Filled 2022-01-01: qty 1

## 2022-01-01 MED ORDER — PANTOPRAZOLE SODIUM 40 MG PO TBEC
80.0000 mg | DELAYED_RELEASE_TABLET | Freq: Every day | ORAL | Status: DC
  Administered 2022-01-01 – 2022-01-02 (×2): 80 mg via ORAL
  Filled 2022-01-01 (×2): qty 2

## 2022-01-01 MED ORDER — CLOPIDOGREL BISULFATE 75 MG PO TABS
75.0000 mg | ORAL_TABLET | Freq: Every day | ORAL | Status: DC
  Administered 2022-01-02: 75 mg via ORAL
  Filled 2022-01-01: qty 1

## 2022-01-01 MED ORDER — SENNOSIDES-DOCUSATE SODIUM 8.6-50 MG PO TABS: 1.0000 | ORAL_TABLET | Freq: Every evening | ORAL | Status: DC | PRN

## 2022-01-01 MED ORDER — LEVOTHYROXINE SODIUM 75 MCG PO TABS
75.0000 ug | ORAL_TABLET | Freq: Every day | ORAL | Status: DC
  Administered 2022-01-02: 75 ug via ORAL
  Filled 2022-01-01: qty 1

## 2022-01-01 MED ORDER — STROKE: EARLY STAGES OF RECOVERY BOOK
Freq: Once | Status: AC
  Administered 2022-01-01: 1
  Filled 2022-01-01: qty 1

## 2022-01-01 MED ORDER — ACETAMINOPHEN 325 MG PO TABS: 650.0000 mg | ORAL_TABLET | ORAL | Status: DC | PRN

## 2022-01-01 MED ORDER — ACETAMINOPHEN 160 MG/5ML PO SOLN: 650.0000 mg | ORAL | Status: DC | PRN

## 2022-01-01 MED ORDER — ACETAMINOPHEN 650 MG RE SUPP: 650.0000 mg | RECTAL | Status: DC | PRN

## 2022-01-01 NOTE — Assessment & Plan Note (Signed)
Continue buspar and xanax prn  ?

## 2022-01-01 NOTE — Assessment & Plan Note (Signed)
Allow for permissive HTN in setting of TIA/stroke work up  ?Hold home norvasc  ?

## 2022-01-01 NOTE — ED Notes (Signed)
Patient returns from MRI

## 2022-01-01 NOTE — ED Notes (Signed)
Patient in MRI 

## 2022-01-01 NOTE — ED Notes (Signed)
Spoke with Jeannetta Nap, RN on 3W. Gave her report on patient. Patient transported to the floor with family at bedside. Patient in possession of all belongings.  ?

## 2022-01-01 NOTE — Assessment & Plan Note (Signed)
TSH in 12/2020 wnl, repeat today ?Continue home synthroid  ?

## 2022-01-01 NOTE — ED Provider Notes (Signed)
?[facility] ?Provider Note ? ? ?CSN: [id number] ?Arrival date & time: 01/01/22  1119 ? ?An emergency department physician performed an initial assessment on this suspected stroke patient at 1120. ? ?History ? ?Chief Complaint  ?Patient presents with  ? Code Stroke  ? ? ?[name] is a 81 y.o. female. ? ?HPI ?81 year old female presents with acute headache and left sided weakness. Started at 10 am. Woke up feeling ok.  First noticed a little bit of watering to her eyes and then a frontal headache.  Then noticed that her left arm was weak.  Did not notice her left leg was weak but it is weak upon EMS arrival.  She has been hypertensive and a max of 976 systolic.  Currently 180 per EMS.  Normal glucose.  Never had similar symptoms. ? ?Home Medications ?Prior to Admission medications   ?Medication Sig Start Date End Date Taking? Authorizing Provider  ?ALPRAZolam (XANAX) 0.25 MG tablet TAKE 1 TO 2 TABLETS TWICE DAILY AS NEEDED FOR ANXIETY ?Patient taking differently: Take 0.25 mg by mouth 2 (two) times daily as needed for anxiety. 12/24/21  Yes Marin Olp, MD  ?amLODipine (NORVASC) 2.5 MG tablet Take 1 tablet (2.5 mg total) by mouth daily. ?Patient taking differently: Take 5 mg by mouth daily. 08/10/21  Yes Marin Olp, MD  ?Aspirin-Acetaminophen-Caffeine (GOODY HEADACHE PO) Take 1 packet by mouth daily as needed (headache).   Yes [provider]  ?busPIRone (BUSPAR) 5 MG tablet TAKE 1 TABLET THREE TIMES DAILY ?Patient taking differently: Take 5 mg by mouth in the morning, at noon, and at bedtime. 03/19/21  Yes Marin Olp, MD  ?Cholecalciferol (VITAMIN D3 PO) Take 1 capsule by mouth 2 (two) times daily.   Yes [provider]  ?levothyroxine (SYNTHROID) 75 MCG tablet TAKE 1 TABLET EVERY DAY ?Patient taking differently: Take 75 mcg by mouth daily. 05/24/21  Yes Marin Olp, MD  ?MAGNESIUM PO Take 1 tablet by mouth daily.   Yes [provider]  ?Multiple Vitamins-Minerals (ZINC PO) Take 1 tablet by mouth daily.   Yes [provider]  ?omeprazole (PRILOSEC) 40 MG capsule TAKE 1 CAPSULE EVERY DAY ?Patient taking differently: Take 40 mg by mouth daily. 08/13/21  Yes Marin Olp, MD  ?Polyethyl Glycol-Propyl Glycol (SYSTANE OP) Apply 1 drop to eye 2 (two) times daily as needed (dry eyes).   Yes [provider]  ?vitamin B-12 (CYANOCOBALAMIN) 1000 MCG tablet Take 1,000 mcg by mouth daily.   Yes [provider]  ?   ? ?Allergies    ?Shellfish allergy and Simvastatin   ? ?Review of Systems   ?Review of Systems  ?Eyes:  Positive for visual disturbance.  ?Neurological:  Positive for weakness and headaches.  ? ?Physical Exam ?Updated Vital Signs ?BP (!) 160/83   Pulse 76   Temp 98.6 ?F (37 ?C) (Oral)   Resp 19   Ht '5\' 6"'$  (1.676 m)   Wt 63.8 kg   LMP  (LMP Unknown)   SpO2 100%   BMI 22.70 kg/m?  ?Physical Exam ?Vitals and nursing note reviewed.  ?Constitutional:   ?   Appearance: She is well-developed.  ?HENT:  ?   Head: Normocephalic and atraumatic.  ?Eyes:  ?   Extraocular Movements: Extraocular movements intact.  ?   Pupils: Pupils are equal, round, and reactive to light.  ?Cardiovascular:  ?   Rate and Rhythm: Normal rate and regular rhythm.  ?  Heart sounds: Normal heart sounds.  ?Pulmonary:  ?   Effort: Pulmonary effort is normal.  ?Abdominal:  ?   General: There is no distension.  ?Skin: ?   General: Skin is warm and dry.  ?Neurological:  ?   Mental Status: She is alert.  ?   Comments: CN 3-12 grossly intact. 5/5 strength in right upper/lower extremities.  Mild weakness/drift in the left upper and left lower extremities.  Grossly normal sensation.   ? ? ?ED Results / Procedures / Treatments   ?Labs ?(all labs ordered are listed, but only abnormal results are displayed) ?Labs Reviewed  ?COMPREHENSIVE METABOLIC PANEL - Abnormal; Notable for the following components:  ?    Result Value  ? GFR, Estimated 59  (*)   ? All other components within normal limits  ?PROTIME-INR  ?APTT  ?CBC  ?DIFFERENTIAL  ?TSH  ?I-STAT CHEM 8, ED  ?CBG MONITORING, ED  ? ? ?EKG ?EKG Interpretation ? ?Date/Time:  Tuesday Jan 01 2022 11:53:56 EDT ?Ventricular Rate:  78 ?PR Interval:    ?QRS Duration: 98 ?QT Interval:  382 ?QTC Calculation: 436 ?R Axis:   52 ?Text Interpretation: Normal sinus rhythm Anteroseptal infarct, age indeterminate no significant change since 2012 Reconfirmed by Sherwood Gambler [phone]) on 01/01/2022 12:46:58 PM ? ?Radiology ?CT HEAD CODE STROKE WO CONTRAST ? ?Result Date: 01/01/2022 ?CLINICAL DATA:  Code stroke. Neuro deficit, acute, stroke suspected. Weakness. EXAM: CT HEAD WITHOUT CONTRAST TECHNIQUE: Contiguous axial images were obtained from the base of the skull through the vertex without intravenous contrast. RADIATION DOSE REDUCTION: This exam was performed according to the departmental dose-optimization program which includes automated exposure control, adjustment of the mA and/or kV according to patient size and/or use of iterative reconstruction technique. COMPARISON:  Head CT 06/09/2012. FINDINGS: Brain: Mild generalized parenchymal atrophy. Mild patchy and ill-defined hypoattenuation within the cerebral white matter, non-specific but compatible with chronic small vessel ischemic disease. There is no acute intracranial hemorrhage. No demarcated cortical infarct. No extra-axial fluid collection. No evidence of an intracranial mass. No midline shift. Vascular: No hyperdense vessel.  Atherosclerotic calcifications. Skull: Normal. Negative for fracture or focal lesion. Sinuses/Orbits: No mass or acute finding within the imaged orbits. Small-volume fluid within a right ethmoid air cell. Trace mucosal thickening scattered elsewhere within the bilateral ethmoid sinuses. ASPECTS (Gem Stroke Program Early CT Score) - Ganglionic level infarction (caudate, lentiform nuclei, internal capsule, insula, M1-M3 cortex): 7 -  Supraganglionic infarction (M4-M6 cortex): 3 Total score (0-10 with 10 being normal): 10 These results were communicated to Dr. Leonel Ramsay at 11:38 amon 5/2/2023by text page via the [employer] messaging system. IMPRESSION: No evidence of acute intracranial abnormality. Mild chronic small vessel ischemic changes within the cerebral white matter. Mild generalized parenchymal atrophy. Mild bilateral ethmoid sinusitis. Electronically Signed   By: Kellie Simmering D.O.   On: 01/01/2022 11:38  ? ?CT ANGIO HEAD NECK W WO CM (CODE STROKE) ? ?Result Date: 01/01/2022 ?CLINICAL DATA:  Assess intracranial arteries. EXAM: CT ANGIOGRAPHY HEAD AND NECK TECHNIQUE: Multidetector CT imaging of the head and neck was performed using the standard protocol during bolus administration of intravenous contrast. Multiplanar CT image reconstructions and MIPs were obtained to evaluate the vascular anatomy. Carotid stenosis measurements (when applicable) are obtained utilizing NASCET criteria, using the distal internal carotid diameter as the denominator. RADIATION DOSE REDUCTION: This exam was performed according to the departmental dose-optimization program which includes automated exposure control, adjustment of the mA and/or kV according to patient size and/or use of  iterative reconstruction technique. CONTRAST:  74m OMNIPAQUE IOHEXOL 350 MG/ML SOLN COMPARISON:  CT head from the same day. FINDINGS: CTA NECK FINDINGS Aortic arch: Great vessel origins are not imaged. Partially imaged atherosclerosis of the aorta. Right carotid system: No evidence of dissection, stenosis (50% or greater) or occlusion. Mild atherosclerosis at the carotid bifurcation. Left carotid system: Approximately 40% stenosis of the common carotid artery due to atherosclerosis. Atherosclerosis at the carotid bifurcation without greater than 50% stenosis. Vertebral arteries: Left dominant. No evidence of significant (greater than 50%) stenosis in the neck. Skeleton: Moderate  multilevel degenerative change.  The Other neck: No acute abnormality. Upper chest: Biapical pleuroparenchymal scarring. Review of the MIP images confirms the above findings CTA HEAD FINDINGS Anterior circulation: Mild calcif

## 2022-01-01 NOTE — ED Notes (Signed)
Patient given sandwich bag. 

## 2022-01-01 NOTE — Assessment & Plan Note (Signed)
Continue PPI ?

## 2022-01-01 NOTE — Assessment & Plan Note (Addendum)
81 year old female with  History of HTN, HLD presenting with stroke like symptoms of sudden headache, vision change and left sided weakness that resolved ?-place in observation on telemetry for TIA/stroke work-up ?-Neurochecks per protocol ?-Neurology consulted ?-MRI brain without contrast  ?-echo  ?-check LDL/A1C  ?-start ASA+plavix per neurology  ?-Permissive hypertension first 24 hours <220/110 ?-N.p.o. until bedside swallow screen ?-PT/ OT consult. No speech or swallowing deficits requiring ST consult ? ?

## 2022-01-01 NOTE — ED Triage Notes (Signed)
Pt BIB EMS from home as code stroke, pt reports left sided weakness and headache that started at 9:30 this morning. States "it feels like my head is trying to have a headache"".  ?

## 2022-01-01 NOTE — H&P (Signed)
?History and Physical  ? ? ?Patient: [name] ALP:[id number] DOB: Feb 29, 1941 ?DOA: 01/01/2022 ?DOS: the patient was seen and examined on 01/01/2022 ?PCP: Marin Olp, MD  ?Patient coming from: Home - lives with her husband  ? ? ?Chief Complaint: stroke like symptoms ? ?HPI: [name] is a 81 y.o. female with medical history significant of HTN, HLD, hypothyroidism, GERD and anxiety who presented to ED with complaints of sudden onset of a headache, blurred vision and left sided weakness around 1000.  She was cooking breakfast and she had watery lines across her eyes and it was hard to see. She had a headache that was mild all over and took a goody powder. She tried to eat, but felt like she was going throw up.  Her left arm started to get weak. She felt like she was going to pass out so laid down on the floor. Her husband called 30. She had no slurred speech, droopy face or confusion. She denies her left leg being weak. Her symptoms completley resolved while in ED.  ? ?Does not smoke or drink. No known family history of stroke that she is aware of.  ? ? ?He has been feeling good. Denies any fever/chills,  chest pain or palpitations, shortness of breath or cough, abdominal pain, N/V/D, dysuria or leg swelling.  ? ? ?ER Course:  vitals: afebrile, bp: 1766/78, HR: 77, RR: 20, oxygen: 97%RA ?Pertinent labs: none ?CT head: no acute finding. Mild bilateral ethmoid sinusitis.  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 ?2. 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  ?In ED code stroke initiated. Neurology consulted.  ? ?Review of Systems: As mentioned in the history of present illness. All other systems reviewed and are negative. ?Past Medical History:  ?Diagnosis Date  ? Anxiety   ? Arthritis   ? neck, back  ? Hx of  colonoscopy with polypectomy   ? Hyperlipidemia   ? diet controlled  ? 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 SVD (spontaneous vaginal delivery)   ? x 4  ? Thyroid disease   ? ?Past Surgical History:  ?Procedure Laterality Date  ? ABDOMINAL HYSTERECTOMY    ? APPENDECTOMY    ? BREAST SURGERY Right   ? LUMPECTOMY- FIBROID TUMOR benign  ? COLONOSCOPY    ? hx polyps/ [facility]-mother   ? ENTEROCELE REPAIR    ? EXCISIONAL HEMORRHOIDECTOMY    ? fnger surgery  04/2018  ? POLYPECTOMY    ? ?Social History:  reports that she quit smoking about 43 years ago. Her smoking use included cigarettes. She started smoking about 56 years ago. She has a 13.00 pack-year smoking history. She has never used smokeless tobacco. She reports that she does not drink alcohol and does not use drugs. ? ?Allergies  ?Allergen Reactions  ? Shellfish Allergy Nausea And Vomiting and Other (See Comments)  ?  Severe stomach pains, fever, sweating  ? Simvastatin Other (See Comments)  ?  Myalgia  ? ? ?Family History  ?Problem Relation Age of Onset  ? Colon cancer Mother 649 ? Anxiety disorder Mother   ? Deep vein thrombosis Mother   ?     died age 82 ? Heart disease Father 771 ?     smoker, high stress job  ? Colon cancer  Brother 51  ? Colon cancer Maternal Aunt 31  ? Colon cancer Son 87  ? Colon cancer Paternal Grandmother   ? Colon cancer Other 17  ? Hodgkin's lymphoma Brother 82  ? Breast cancer Daughter   ? Colon polyps Neg Hx   ? Esophageal cancer Neg Hx   ? Rectal cancer Neg Hx   ? Stomach cancer Neg Hx   ? ? ?Prior to Admission medications   ?Medication Sig Start Date End Date Taking? Authorizing Provider  ?ALPRAZolam (XANAX) 0.25 MG tablet TAKE 1 TO 2 TABLETS TWICE DAILY AS NEEDED FOR ANXIETY ?Patient taking differently: Take 0.25 mg by mouth 2 (two) times daily as needed for anxiety. 12/24/21  Yes Marin Olp, MD  ?amLODipine (NORVASC) 2.5 MG tablet Take 1 tablet (2.5 mg total) by mouth daily. ?Patient taking differently: Take 5 mg by mouth daily.  08/10/21  Yes Marin Olp, MD  ?Aspirin-Acetaminophen-Caffeine (GOODY HEADACHE PO) Take 1 packet by mouth daily as needed (headache).   Yes [provider]  ?busPIRone (BUSPAR) 5 MG tablet TAKE 1 TABLET THREE TIMES DAILY ?Patient taking differently: Take 5 mg by mouth in the morning, at noon, and at bedtime. 03/19/21  Yes Marin Olp, MD  ?Cholecalciferol (VITAMIN D3 PO) Take 1 capsule by mouth 2 (two) times daily.   Yes [provider]  ?levothyroxine (SYNTHROID) 75 MCG tablet TAKE 1 TABLET EVERY DAY ?Patient taking differently: Take 75 mcg by mouth daily. 05/24/21  Yes Marin Olp, MD  ?MAGNESIUM PO Take 1 tablet by mouth daily.   Yes [provider]  ?Multiple Vitamins-Minerals (ZINC PO) Take 1 tablet by mouth daily.   Yes [provider]  ?omeprazole (PRILOSEC) 40 MG capsule TAKE 1 CAPSULE EVERY DAY ?Patient taking differently: Take 40 mg by mouth daily. 08/13/21  Yes Marin Olp, MD  ?Polyethyl Glycol-Propyl Glycol (SYSTANE OP) Apply 1 drop to eye 2 (two) times daily as needed (dry eyes).   Yes [provider]  ?vitamin B-12 (CYANOCOBALAMIN) 1000 MCG tablet Take 1,000 mcg by mouth daily.   Yes [provider]  ? ? ?Physical Exam: ?Vitals:  ? 01/01/22 1100 01/01/22 1126 01/01/22 1133 01/01/22 1148  ?BP:  (!) 176/78 (!) 160/83   ?Pulse:  77 76   ?Resp:  20 19   ?Temp:  98.6 ?F (37 ?C)    ?TempSrc:  Oral    ?SpO2:  97% 100%   ?Weight: 63.8 kg   63.8 kg  ?Height:    '5\' 6"'$  (1.676 m)  ? ?General:  Appears calm and comfortable and is in NAD ?Eyes:  PERRL, EOMI, normal lids, iris ?ENT:  grossly normal hearing, lips & tongue, mmm; appropriate dentition ?Neck:  no LAD, masses or thyromegaly; no carotid bruits ?Cardiovascular:  RRR, no m/r/g. No LE edema.  ?Respiratory:   CTA bilaterally with no wheezes/rales/rhonchi.  Normal respiratory effort. ?Abdomen:  soft, NT, ND, NABS ?Back:   normal alignment, no CVAT ?Skin:  no rash or induration seen on  limited exam ?Musculoskeletal:  grossly normal tone BUE/BLE, good ROM, no bony abnormality. Strength 5/5 bilateral lower extremities. Strength 5/5 bilateral UE  ?Lower extremity:  No LE edema.  Limited foot exam with no ulcerations.  2+ distal pulses. ?Psychiatric:  grossly normal mood and affect, speech fluent and appropriate, AOx3 ?Neurologic:  CN 2-12 grossly intact, moves all extremities in coordinated fashion, sensation intact. DTR2+, HTK intact bilaterally, FTN intact bilaterally, negative pronator drift. Gait deferred  ? ? ?  Radiological Exams on Admission: ?Independently reviewed - see discussion in A/P where applicable ? ?CT HEAD CODE STROKE WO CONTRAST ? ?Result Date: 01/01/2022 ?CLINICAL DATA:  Code stroke. Neuro deficit, acute, stroke suspected. Weakness. EXAM: CT HEAD WITHOUT CONTRAST TECHNIQUE: Contiguous axial images were obtained from the base of the skull through the vertex without intravenous contrast. RADIATION DOSE REDUCTION: This exam was performed according to the departmental dose-optimization program which includes automated exposure control, adjustment of the mA and/or kV according to patient size and/or use of iterative reconstruction technique. COMPARISON:  Head CT 06/09/2012. FINDINGS: Brain: Mild generalized parenchymal atrophy. Mild patchy and ill-defined hypoattenuation within the cerebral white matter, non-specific but compatible with chronic small vessel ischemic disease. There is no acute intracranial hemorrhage. No demarcated cortical infarct. No extra-axial fluid collection. No evidence of an intracranial mass. No midline shift. Vascular: No hyperdense vessel.  Atherosclerotic calcifications. Skull: Normal. Negative for fracture or focal lesion. Sinuses/Orbits: No mass or acute finding within the imaged orbits. Small-volume fluid within a right ethmoid air cell. Trace mucosal thickening scattered elsewhere within the bilateral ethmoid sinuses. ASPECTS [facility] Stroke Program Early CT  Score) - Ganglionic level infarction (caudate, lentiform nuclei, internal capsule, insula, M1-M3 cortex): 7 - Supraganglionic infarction (M4-M6 cortex): 3 Total score (0-10 with 10 being normal): 10 These resu

## 2022-01-01 NOTE — Code Documentation (Signed)
Ms. Kechia Yahnke is an 81 yr old female with a history of hypertension and hypercholesterolemia. She was in her usual state of health today until she experienced a sudden onset of headache and blurred vision accompanied by left sided weakness at 1000. She is on no thinners. Code stroke activated by EMS. Pt arrived [facility] at 1119. She had drift in both left arm and left leg. Pt airway cleared at bridge, labs and CBG obtained. She was taken to CT. Per neurologist, CT was negative for acute hemorrhage. CTA done as well. In CT scan, pt had total resolution of her left sided weakness. Pt returned to room 22 where her workup will continue. Should symptoms reoccur, a new code stroke should be activated. Pt will need q 2 hr NIHSS and VS. Permissive hypertension up to 220/110. Pt is not a candidate for thrombolytic as resolution of symptoms. Pt is not a candidate for IR as LVO negative and resolution of symptoms.  ?

## 2022-01-01 NOTE — Progress Notes (Signed)
2D echo attempted, but patient in MRI. Will try later 

## 2022-01-01 NOTE — Consult Note (Signed)
Neurology Consultation ?Reason for Consult: Left sided weakness ?Referring Physician: Regenia Mcclain, S ? ?CC: Left sided weakness ? ?History is obtained from: Patient ? ?HPI: [name] is a 81 y.o. female who was in her normal state of health this morning.  She was making breakfast when she had sudden onset left-sided weakness and numbness.  EMS was called and on their arrival, she had significant left-sided weakness and therefore code stroke was activated.  She still had some weakness on arrival but was improving at the bridge, and was taken emergently for CT. by the time CT was completed, her symptoms had resolved.  She also describes some blurred vision associated with the episode.  She also had some holocephalic headache. ? ? ?LKW: 10 AM ?tpa given?: no, resolution of symptoms ? ?NIH stroke scale initially was a two for left arm drift and left leg drift, subsequently resolved to zero. ? ? ?ROS: A 14 point ROS was performed and is negative except as noted in the HPI.  ?Past Medical History:  ?Diagnosis Date  ? Anxiety   ? Arthritis   ? neck, back  ? Hx of colonoscopy with polypectomy   ? Hyperlipidemia   ? diet controlled  ? 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 SVD (spontaneous vaginal delivery)   ? x 4  ? Thyroid disease   ? ? ? ?Family History  ?Problem Relation Age of Onset  ? Colon cancer Mother 42  ? Anxiety disorder Mother   ? Deep vein thrombosis Mother   ?     died age 23  ? Heart disease Father 38  ?     smoker, high stress job  ? Colon cancer Brother 95  ? Colon cancer Maternal Aunt 28  ? Colon cancer Son 96  ? Colon cancer Paternal Grandmother   ? Colon cancer Other 42  ? Hodgkin's lymphoma Brother 6  ? Breast cancer Daughter   ? Colon polyps Neg Hx   ? Esophageal cancer Neg Hx   ? Rectal cancer Neg Hx   ? Stomach cancer Neg Hx   ? ? ? ?Social History:  reports that she quit smoking about 43 years ago. Her smoking use included cigarettes. She started smoking about 56 years ago. She has a 13.00 pack-year  smoking history. She has never used smokeless tobacco. She reports that she does not drink alcohol and does not use drugs. ? ? ?Exam: ?Current vital signs: ?BP (!) 160/83   Pulse 76   Temp 98.6 ?F (37 ?C) (Oral)   Resp 19   Ht '5\' 6"'$  (1.676 m)   Wt 63.8 kg   LMP  (LMP Unknown)   SpO2 100%   BMI 22.70 kg/m?  ?Vital signs in last 24 hours: ?Temp:  [98.6 ?F (37 ?C)] 98.6 ?F (37 ?C) (05/02 1126) ?Pulse Rate:  [76-77] 76 (05/02 1133) ?Resp:  [19-20] 19 (05/02 1133) ?BP: (160-176)/(78-83) 160/83 (05/02 1133) ?SpO2:  [97 %-100 %] 100 % (05/02 1133) ?Weight:  [63.8 kg] 63.8 kg (05/02 1148) ? ? ?Physical Exam  ?Constitutional: Appears well-developed and well-nourished.  ?Psych: Affect appropriate to situation ?Eyes: No scleral injection ?HENT: No OP obstruction ?MSK: no joint deformities.  ?Cardiovascular: Normal rate and regular rhythm.  ?Respiratory: Effort normal, non-labored breathing ?GI: Soft.  No distension. There is no tenderness.  ?Skin: WDI ? ?Neuro: ?Mental Status: ?Patient is awake, alert, oriented to person, place, month, year, and situation. ?Patient is able to give a clear and coherent history. ?No  signs of aphasia or neglect ?Cranial Nerves: ?II: Visual Fields are full. Pupils are equal, round, and reactive to light.   ?III,IV, VI: EOMI without ptosis or diploplia.  ?V: Facial sensation is symmetric to temperature ?VII: Facial movement is symmetric.  ?VIII: hearing is intact to voice ?X: Uvula elevates symmetrically ?XI: Shoulder shrug is symmetric. ?XII: tongue is midline without atrophy or fasciculations.  ?Motor: ?Tone is normal. Bulk is normal. 5/5 strength was present in all four extremities after CT, initially had mild 4+/5 weakness of the left arm and leg ?Sensory: ?Sensation is symmetric to light touch and temperature in the arms and legs. ?Cerebellar: ?No clear ataxia ? ? ? ? ?I have reviewed labs in epic and the results pertinent to this consultation are: ?CMP unremarkable ?CBC-normal ? ?I  have reviewed the images obtained: CT head-negative, CTA-multifocal stenosis ? ?Impression: 81 year old female with transient left-sided weakness.  She has resolved at this point, and therefore tpa clock is reset.  She has multifocal intracranial atherosclerotic disease, and I suspect that this does represent transient cerebral ischemic attack.  She will be admitted for secondary respirator modification. ? ?Recommendations: ?- HgbA1c, fasting lipid panel ?-I would favor statin therapy, she has had intolerance to simvastatin in the past, would discuss retrial, or consider PCSK9 inhibitor if LDL is elevated ?- MRI of the brain without contrast ?- Frequent neuro checks ?- Echocardiogram ?- Prophylactic therapy-Antiplatelet med: Aspirin - dose '81mg'$  and plavix '75mg'$  daily after '300mg'$  load  ?- Risk factor modification ?- Telemetry monitoring ?- PT consult, OT consult, Speech consult ?- Stroke team to follow ? ? ? ?Roland Rack, MD ?[facility] ?[phone] ? ?If 7pm- 7am, please page neurology on call as listed in [place]. ? ?

## 2022-01-01 NOTE — Assessment & Plan Note (Signed)
Diet controlled. Per PCP note "simvastatin '10mg'$  daily caused significant depressed mood that resolved off medication" ?Check LDL with goal <70 and will need to discuss possible trial of crestor with her if needed  ?

## 2022-01-02 ENCOUNTER — Observation Stay (HOSPITAL_BASED_OUTPATIENT_CLINIC_OR_DEPARTMENT_OTHER): Payer: Medicare HMO

## 2022-01-02 ENCOUNTER — Other Ambulatory Visit (HOSPITAL_COMMUNITY): Payer: Self-pay

## 2022-01-02 DIAGNOSIS — K219 Gastro-esophageal reflux disease without esophagitis: Secondary | ICD-10-CM

## 2022-01-02 DIAGNOSIS — F411 Generalized anxiety disorder: Secondary | ICD-10-CM

## 2022-01-02 DIAGNOSIS — G459 Transient cerebral ischemic attack, unspecified: Secondary | ICD-10-CM

## 2022-01-02 DIAGNOSIS — I10 Essential (primary) hypertension: Secondary | ICD-10-CM | POA: Diagnosis not present

## 2022-01-02 LAB — LIPID PANEL
Cholesterol: 222 mg/dL — ABNORMAL HIGH (ref 0–200)
HDL: 56 mg/dL (ref >40)
LDL Cholesterol: 149 mg/dL — ABNORMAL HIGH (ref 0–99)
Total CHOL/HDL Ratio: 4 RATIO
Triglycerides: 84 mg/dL (ref <150)
VLDL: 17 mg/dL (ref 0–40)

## 2022-01-02 LAB — ECHOCARDIOGRAM COMPLETE
AR max vel: 2.58 cm2
AV Area VTI: 2.58 cm2
AV Area mean vel: 2.52 cm2
AV Mean grad: 3 mmHg
AV Peak grad: 5 mmHg
Ao pk vel: 1.12 m/s
Area-P 1/2: 2.73 cm2
Height: 66 in
S' Lateral: 2.6 cm
Weight: 2250.46 oz

## 2022-01-02 MED ORDER — ROSUVASTATIN CALCIUM 20 MG PO TABS
20.0000 mg | ORAL_TABLET | Freq: Every day | ORAL | 3 refills | Status: DC
  Filled 2022-01-02: qty 30, 30d supply, fill #0

## 2022-01-02 MED ORDER — CLOPIDOGREL BISULFATE 75 MG PO TABS
75.0000 mg | ORAL_TABLET | Freq: Every day | ORAL | 0 refills | Status: AC
  Filled 2022-01-02: qty 21, 21d supply, fill #0

## 2022-01-02 MED ORDER — AMLODIPINE BESYLATE 2.5 MG PO TABS
5.0000 mg | ORAL_TABLET | Freq: Every day | ORAL | 3 refills | Status: DC
  Filled 2022-01-02: qty 60, 30d supply, fill #0

## 2022-01-02 MED ORDER — ROSUVASTATIN CALCIUM 20 MG PO TABS: 20.0000 mg | ORAL_TABLET | Freq: Every day | ORAL | Status: DC

## 2022-01-02 MED ORDER — ASPIRIN 81 MG PO TBEC
81.0000 mg | DELAYED_RELEASE_TABLET | Freq: Every day | ORAL | 11 refills | Status: AC
  Filled 2022-01-02: qty 30, 30d supply, fill #0

## 2022-01-02 NOTE — Progress Notes (Addendum)
STROKE TEAM PROGRESS NOTE   INTERVAL HISTORY Patient is seen in her room with no family at the bedside.  Yesterday, she experienced sudden onset left sided weakness and numbness which has since resolved.  MRI was negative for stroke.  Vitals:   01/01/22 2352 01/02/22 0414 01/02/22 0900 01/02/22 1123  BP: (!) 141/73 137/67 126/78 129/80  Pulse: 65 67 83 84  Resp: 15 15 18 18   Temp: 98.5 F (36.9 C) 98.2 F (36.8 C) 98 F (36.7 C) 98.3 F (36.8 C)  TempSrc: Oral Oral Oral Oral  SpO2: 100% 99% 100% 98%  Weight:      Height:       CBC:  Recent Labs  Lab 01/01/22 1124 01/01/22 1134  WBC 6.7  --   NEUTROABS 4.0  --   HGB 13.9 13.9  HCT 41.7 41.0  MCV 92.3  --   PLT 185  --    Basic Metabolic Panel:  Recent Labs  Lab 01/01/22 1124 01/01/22 1134  NA 136 135  K 4.3 4.3  CL 104 100  CO2 26  --   GLUCOSE 93 89  BUN 13 14  CREATININE 0.96 0.90  CALCIUM 9.2  --    Lipid Panel:  Recent Labs  Lab 01/02/22 0345  CHOL 222*  TRIG 84  HDL 56  CHOLHDL 4.0  VLDL 17  LDLCALC 440*   HgbA1c:  Recent Labs  Lab 01/01/22 1944  HGBA1C 5.2   Urine Drug Screen: No results for input(s): LABOPIA, COCAINSCRNUR, LABBENZ, AMPHETMU, THCU, LABBARB in the last 168 hours.  Alcohol Level No results for input(s): ETH in the last 168 hours.  IMAGING past 24 hours MR BRAIN WO CONTRAST  Result Date: 01/01/2022 CLINICAL DATA:  Neuro deficit, acute, stroke suspected EXAM: MRI HEAD WITHOUT CONTRAST TECHNIQUE: Multiplanar, multiecho pulse sequences of the brain and surrounding structures were obtained without intravenous contrast. COMPARISON:  None Available. FINDINGS: Brain: There is no acute infarction or intracranial hemorrhage. There is no intracranial mass, mass effect, or edema. There is no hydrocephalus or extra-axial fluid collection. Prominence of the ventricles and sulci reflects parenchymal volume loss. Patchy foci of T2 hyperintensity in the supratentorial and pontine white matter are  nonspecific but may reflect mild chronic microvascular ischemic changes. Vascular: Major vessel flow voids at the skull base are preserved. Skull and upper cervical spine: Normal marrow signal is preserved. Sinuses/Orbits: Minor mucosal thickening.  Orbits are unremarkable. Other: Sella is unremarkable.  Mastoid air cells are clear. IMPRESSION: No acute infarction, hemorrhage, or mass. Mild chronic microvascular ischemic changes. Electronically Signed   By: Guadlupe Spanish M.D.   On: 01/01/2022 15:09   ECHOCARDIOGRAM COMPLETE  Result Date: 01/02/2022    ECHOCARDIOGRAM REPORT   Patient Name:   [name] Date of Exam: 01/02/2022 Medical Rec #:  [id number]     Height:       66.0 in Accession #:    [id number]    Weight:       140.7 lb Date of Birth:  Nov 17, 1940     BSA:          1.722 m Patient Age:    81 years      BP:           137/67 mmHg Patient Gender: F             HR:           82 bpm. Exam Location:  Inpatient Procedure: 2D Echo, Cardiac Doppler and  Color Doppler Indications:    TIA  History:        Patient has no prior history of Echocardiogram examinations.                 Risk Factors:Hypertension, Dyslipidemia and Former Smoker. GERD.  Sonographer:    Ross Ludwig RDCS (AE) Referring Phys: [id number] [facility]  Sonographer Comments: Suboptimal parasternal window. IMPRESSIONS  1. Left ventricular ejection fraction, by estimation, is 60 to 65%. The left ventricle has normal function. The left ventricle has no regional wall motion abnormalities. Left ventricular diastolic parameters are consistent with Grade I diastolic dysfunction (impaired relaxation).  2. Right ventricular systolic function is normal. The right ventricular size is normal.  3. The mitral valve is normal in structure. No evidence of mitral valve regurgitation. No evidence of mitral stenosis.  4. The aortic valve is normal in structure. Aortic valve regurgitation is not visualized. No aortic stenosis is present.  5. The inferior vena cava is  normal in size with greater than 50% respiratory variability, suggesting right atrial pressure of 3 mmHg. Conclusion(s)/Recommendation(s): No intracardiac source of embolism detected on this transthoracic study. Consider a transesophageal echocardiogram to exclude cardiac source of embolism if clinically indicated. FINDINGS  Left Ventricle: Left ventricular ejection fraction, by estimation, is 60 to 65%. The left ventricle has normal function. The left ventricle has no regional wall motion abnormalities. The left ventricular internal cavity size was normal in size. There is  no left ventricular hypertrophy. Left ventricular diastolic parameters are consistent with Grade I diastolic dysfunction (impaired relaxation).  LV Wall Scoring: The posterior wall is normal. Right Ventricle: The right ventricular size is normal. No increase in right ventricular wall thickness. Right ventricular systolic function is normal. Left Atrium: Left atrial size was normal in size. Right Atrium: Right atrial size was normal in size. Pericardium: There is no evidence of pericardial effusion. Mitral Valve: The mitral valve is normal in structure. No evidence of mitral valve regurgitation. No evidence of mitral valve stenosis. Tricuspid Valve: The tricuspid valve is normal in structure. Tricuspid valve regurgitation is not demonstrated. No evidence of tricuspid stenosis. Aortic Valve: The aortic valve is normal in structure. Aortic valve regurgitation is not visualized. No aortic stenosis is present. Aortic valve mean gradient measures 3.0 mmHg. Aortic valve peak gradient measures 5.0 mmHg. Aortic valve area, by VTI measures 2.58 cm. Pulmonic Valve: The pulmonic valve was normal in structure. Pulmonic valve regurgitation is not visualized. No evidence of pulmonic stenosis. Aorta: The aortic root is normal in size and structure. Venous: The inferior vena cava is normal in size with greater than 50% respiratory variability, suggesting right  atrial pressure of 3 mmHg. IAS/Shunts: No atrial level shunt detected by color flow Doppler.  LEFT VENTRICLE PLAX 2D LVIDd:         3.40 cm   Diastology LVIDs:         2.60 cm   LV e' medial:    6.85 cm/s LV PW:         0.90 cm   LV E/e' medial:  8.5 LV IVS:        0.90 cm   LV e' lateral:   11.00 cm/s LVOT diam:     1.90 cm   LV E/e' lateral: 5.3 LV SV:         49 LV SV Index:   28 LVOT Area:     2.84 cm  RIGHT VENTRICLE  IVC RV Basal diam:  2.70 cm     IVC diam: 1.10 cm RV S prime:     12.20 cm/s TAPSE (M-mode): 2.0 cm LEFT ATRIUM             Index       RIGHT ATRIUM          Index LA diam:        2.10 cm 1.22 cm/m  RA Area:     7.73 cm LA Vol (A2C):   11.0 ml 6.39 ml/m  RA Volume:   14.10 ml 8.19 ml/m LA Vol (A4C):   13.3 ml 7.72 ml/m LA Biplane Vol: 12.6 ml 7.32 ml/m  AORTIC VALVE AV Area (Vmax):    2.58 cm AV Area (Vmean):   2.52 cm AV Area (VTI):     2.58 cm AV Vmax:           112.00 cm/s AV Vmean:          73.800 cm/s AV VTI:            0.189 m AV Peak Grad:      5.0 mmHg AV Mean Grad:      3.0 mmHg LVOT Vmax:         102.00 cm/s LVOT Vmean:        65.500 cm/s LVOT VTI:          0.172 m LVOT/AV VTI ratio: 0.91  AORTA Ao Root diam: 2.90 cm MITRAL VALVE MV Area (PHT): 2.73 cm    SHUNTS MV Decel Time: 278 msec    Systemic VTI:  0.17 m MV E velocity: 58.10 cm/s  Systemic Diam: 1.90 cm MV A velocity: 82.70 cm/s MV E/A ratio:  0.70 Donato Schultz MD Electronically signed by Donato Schultz MD Signature Date/Time: 01/02/2022/10:51:55 AM    Final    CT HEAD CODE STROKE WO CONTRAST  Result Date: 01/01/2022 CLINICAL DATA:  Code stroke. Neuro deficit, acute, stroke suspected. Weakness. EXAM: CT HEAD WITHOUT CONTRAST TECHNIQUE: Contiguous axial images were obtained from the base of the skull through the vertex without intravenous contrast. RADIATION DOSE REDUCTION: This exam was performed according to the departmental dose-optimization program which includes automated exposure control, adjustment of the  mA and/or kV according to patient size and/or use of iterative reconstruction technique. COMPARISON:  Head CT 06/09/2012. FINDINGS: Brain: Mild generalized parenchymal atrophy. Mild patchy and ill-defined hypoattenuation within the cerebral white matter, non-specific but compatible with chronic small vessel ischemic disease. There is no acute intracranial hemorrhage. No demarcated cortical infarct. No extra-axial fluid collection. No evidence of an intracranial mass. No midline shift. Vascular: No hyperdense vessel.  Atherosclerotic calcifications. Skull: Normal. Negative for fracture or focal lesion. Sinuses/Orbits: No mass or acute finding within the imaged orbits. Small-volume fluid within a right ethmoid air cell. Trace mucosal thickening scattered elsewhere within the bilateral ethmoid sinuses. ASPECTS ([place] Stroke Program Early CT Score) - Ganglionic level infarction (caudate, lentiform nuclei, internal capsule, insula, M1-M3 cortex): 7 - Supraganglionic infarction (M4-M6 cortex): 3 Total score (0-10 with 10 being normal): 10 These results were communicated to Dr. Amada Jupiter at 11:38 amon 5/2/2023by text page via the [facility] messaging system. IMPRESSION: No evidence of acute intracranial abnormality. Mild chronic small vessel ischemic changes within the cerebral white matter. Mild generalized parenchymal atrophy. Mild bilateral ethmoid sinusitis. Electronically Signed   By: Jackey Loge D.O.   On: 01/01/2022 11:38   CT ANGIO HEAD NECK W WO CM (CODE STROKE)  Result Date: 01/01/2022 CLINICAL  DATA:  Assess intracranial arteries. EXAM: CT ANGIOGRAPHY HEAD AND NECK TECHNIQUE: Multidetector CT imaging of the head and neck was performed using the standard protocol during bolus administration of intravenous contrast. Multiplanar CT image reconstructions and MIPs were obtained to evaluate the vascular anatomy. Carotid stenosis measurements (when applicable) are obtained utilizing NASCET criteria, using the distal  internal carotid diameter as the denominator. RADIATION DOSE REDUCTION: This exam was performed according to the departmental dose-optimization program which includes automated exposure control, adjustment of the mA and/or kV according to patient size and/or use of iterative reconstruction technique. CONTRAST:  50mL OMNIPAQUE IOHEXOL 350 MG/ML SOLN COMPARISON:  CT head from the same day. FINDINGS: CTA NECK FINDINGS Aortic arch: Great vessel origins are not imaged. Partially imaged atherosclerosis of the aorta. Right carotid system: No evidence of dissection, stenosis (50% or greater) or occlusion. Mild atherosclerosis at the carotid bifurcation. Left carotid system: Approximately 40% stenosis of the common carotid artery due to atherosclerosis. Atherosclerosis at the carotid bifurcation without greater than 50% stenosis. Vertebral arteries: Left dominant. No evidence of significant (greater than 50%) stenosis in the neck. Skeleton: Moderate multilevel degenerative change.  The Other neck: No acute abnormality. Upper chest: Biapical pleuroparenchymal scarring. Review of the MIP images confirms the above findings CTA HEAD FINDINGS Anterior circulation: Mild calcific atherosclerosis of bilateral intracranial ICAs. Bilateral MCAs are patent. Left A1 ACA is not visualized, potentially congenitally small or occluded. Approximately 2 mm outpouching arising from the left aspect of the anterior communicating artery (series 7, image 95), which could represent a small aneurysm versus retrograde flow into the left A1 ACA. Bilateral more distal ACAs are opacified with severe multifocal stenosis of the right A2 and A3 ACA. Posterior circulation: Left dominant intradural vertebral artery. Right vertebral artery appears to terminate as PICA, anatomic variant. Mild atherosclerosis of the proximal left intradural vertebral artery. 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 Severe  stenosis of the left P1 and P2 PCA. Moderate stenosis of the right P2 PCA. Right posterior communicating artery is present. Venous sinuses: As permitted by contrast timing, patent. Anatomic variants: Detailed above Review of the MIP images confirms the above findings IMPRESSION: 1. 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 2. 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 3. Left A1 ACA is not visualized, potentially congenitally small and/or occluded. More distal ACAs are opacified. 4. Approximately 2 mm outpouching arising from the posterior left aspect of the anterior communicating artery, which could represent a small aneurysm versus retrograde flow into the left A1 ACA. 5. No evidence of significant (greater than 50%) stenosis in the neck. Electronically Signed   By: Feliberto Harts M.D.   On: 01/01/2022 12:02    PHYSICAL EXAM General:  Alert, well-developed, well-nourished patient in no acute distress Respiratory:  Respirations regular and unlabored on room air  NEURO:  Mental Status: AA&Ox3  Speech/Language: speech is without dysarthria or aphasia.  Fluency, and comprehension intact.  Cranial Nerves:  II: PERRL.  III, IV, VI: EOMI. Eyelids elevate symmetrically.  V: Sensation is intact to light touch and symmetrical to face.  VII: Smile is symmetrical.   VIII: hearing intact to voice. IX, X: Phonation is normal.  XII: tongue is midline without fasciculations. Motor: 5/5 strength to all muscle groups tested.  Tone: is normal and bulk is normal Sensation- Intact to light touch bilaterally.  Coordination: FTN intact bilaterally.No drift.  Gait- deferred   ASSESSMENT/PLAN [name] is a 81 y.o. female with history of anxiety, arthritis and HLD presenting with sudden onset left sided weakness and numbness which has since resolved.  MRI was negative for stroke.  TIA vs. complicated migraine Episode of  whole body weakness left more than right, feeling nauseous, visual disturbance with wavy lines, with whole head headache.  Resolved within 1 hour. Code Stroke CT head No acute abnormality. Small vessel disease. Atrophy. ASPECTS 10.    CTA head & neck moderate stenosis of left intradural vertebral artery, basilar artery and left PCA, left A1 ACA not visualized MRI  no acute infarction, mild chronic microvascular ischemic changes 2D Echo EF 60-65%, grade 1 diastolic dysfunction, no atrial level shunt LDL 149 HgbA1c 5.2 VTE prophylaxis - SCDs No antithrombotic prior to admission, now on aspirin 81 mg daily and clopidogrel 75 mg daily DAPT for 3 weeks and then aspirin alone. Therapy recommendations:  outpatient PT Disposition:  likely home  Hypertension Home meds:  amlodipine 2.5 mg daily Stable Avoid low BP given vertebral artery and basilar artery mod stenosis Long-term BP goal normotensive  Hyperlipidemia Home meds:  none LDL 149, goal < 70 Add rosuvastatin 20 mg daily  Continue statin at discharge  Other Stroke Risk Factors Advanced Age >/= 35  Former cigarette smoker  Other Active Problems none  Hospital day # 0  Cortney E Ernestina Columbia , MSN, AGACNP-BC [facility] See Amion for schedule and pager information 01/02/2022 11:38 AM  ATTENDING NOTE: I reviewed above note and agree with the assessment and plan. Pt was seen and examined.   81 year old female with history of hyperlipidemia admitted for episode of whole body weakness, left more than right, nauseous, visual disturbance with wavy lines, whole head hurts.  Denies history for migraine or TIA.  CT no acute abnormality.  CT head neck left V4 and proximal BA mild to moderate stenosis, left P1 and P2 stenosis.  MRI no acute abnormality.  EF 60 to 65%.  LDL 149, A1c 5.2.  Creatinine 0.90.  On exam, patient not back to baseline, no focal deficit.  Etiology for patient symptoms concerning TIA, however complicated  migraine cannot be ruled out given wavy lines and headache.  Recommend aspirin 81 and Plavix 75 DAPT for 3 weeks and then aspirin alone.  Continue Crestor for HLD.  PT/OT recommend outpatient PT/OT.  For detailed assessment and plan, please refer to above as I have made changes wherever appropriate.   Neurology will sign off. Please call with questions. Pt will follow up with stroke clinic NP at [facility] in about 4 weeks. Thanks for the consult.   Marvel Plan, MD PhD Stroke Neurology 01/02/2022 6:13 PM    To contact Stroke Continuity provider, please refer to [email or web address]. After hours, contact General Neurology

## 2022-01-02 NOTE — Hospital Course (Signed)
[name] is an 81 y.o. F who presented with sudden onset headache, blurred vision and left sided hemiparesis. ? ?In the ER, CTH normal, symptoms resolved.  CTA showed severe multifocal intracranial disease. Admitted for risk factor modification. ?

## 2022-01-02 NOTE — Discharge Summary (Signed)
?Physician Discharge Summary ?  ?Patient: [name] MRN: [id number] DOB: [birth year]  ?Admit date:     01/01/2022  ?Discharge date: 01/02/22  ?Discharge Physician: Edwin Dada  ? ?PCP: Marin Olp, MD  ? ? ? ?Recommendations at discharge:  ?Follow up with PCP Dr. Yong Channel in 1 week ?Dr. Yong Channel: Please check BP in 1 week and check LDL in 2-3 months on new Crestor ?Dr. Yong Channel: Please monitor for side effects on Crestor, and continue, titrate up, or rotate as needed depending on side effects ?Follow up with [facility] Neurology in 4-6 weeks ?Follow up with Outpatient PT ? ? ? ? ?Discharge Diagnoses: ?Principal Problem: ?  TIA (transient ischemic attack) ?Active Problems: ?  Essential hypertension ?  Hypercholesterolemia ?  Hypothyroidism ?  GAD (generalized anxiety disorder) ?  ? ?  ? ? ?Hospital Course: ?[name] is an 81 y.o. F who presented with sudden onset headache, blurred vision and left sided hemiparesis. ? ?In the ER, CTH normal, symptoms resolved.  CTA showed severe multifocal intracranial disease. Admitted for risk factor modification. ? ? ? ? ? ? ?* TIA (transient ischemic attack) ?MRI brain was unremarkable.  Non-invasive angiography showed multifocal atherosclerosis with some high grade stenoses in multiple beds.  Carotids normal.  Echocardiogram showed no cardiogenic source of embolism ? ?Lipids ordered: LDL 140s, discharged on new Crestor and close PCP follow up for titration, rotation. ? ?Aspirin ordered at admission --> discharged on DAPT for 3 weeks then aspirin 81 mg alone indefinitely  ? ?Atrial fibrillation: none on monitoring.  Symptoms resolved, hence no tPA given.  Dysphagia screen passed in ER.  PT recommended Outpatietn PT.  Former smoker, long quit.   ? ? ? ?Essential hypertension ?BP slightly elevated here.  Discharged on home amlodipine (reports she takes this at 5 mg daily) and close PCP follow up. ? ? ? ?Hypercholesterolemia ?LDL 149 here.  Given extensive multifocal  atherosclerosis, benefit and risk of balance now strongly favors antilipid medication. ? ?Trial Crestor.   ? ? ? ? ?  ? ? ? ? ? ?The [facility] Controlled Substances Registry was reviewed for this patient prior to discharge.  ? ?Consultants: Neurology ?Procedures performed: Echocardiogram, MRI brain, CTA head and neck  ?Disposition: Home ?Diet recommendation:  ?Discharge Diet Orders (From admission, onward)  ? ?  Start     Ordered  ? 01/02/22 0000  Diet - low sodium heart healthy       ? 01/02/22 1159  ? ?  ?  ? ?  ? ? ? ?DISCHARGE MEDICATION: ?Allergies as of 01/02/2022   ? ?   Reactions  ? Shellfish Allergy Nausea And Vomiting, Other (See Comments)  ? Severe stomach pains, fever, sweating  ? Simvastatin Other (See Comments)  ? Myalgia  ? ?  ? ?  ?Medication List  ?  ? ?STOP taking these medications   ? ?omeprazole 40 MG capsule ?Commonly known as: PRILOSEC ?  ? ?  ? ?TAKE these medications   ? ?ALPRAZolam 0.25 MG tablet ?Commonly known as: Duanne Moron ?TAKE 1 TO 2 TABLETS TWICE DAILY AS NEEDED FOR ANXIETY ?What changed: See the new instructions. ?  ?amLODipine 2.5 MG tablet ?Commonly known as: NORVASC ?Take 2 tablets (5 mg total) by mouth daily. ?  ?aspirin 81 MG EC tablet ?Take 1 tablet (81 mg total) by mouth daily. Swallow whole. ?Start taking on: Jan 03, 2022 ?  ?busPIRone 5 MG tablet ?Commonly known as: BUSPAR ?TAKE 1 TABLET  THREE TIMES DAILY ?What changed: when to take this ?  ?clopidogrel 75 MG tablet ?Commonly known as: PLAVIX ?Take 1 tablet (75 mg total) by mouth daily for 21 days. ?Start taking on: Jan 03, 2022 ?  ?GOODY HEADACHE PO ?Take 1 packet by mouth daily as needed (headache). ?  ?levothyroxine 75 MCG tablet ?Commonly known as: SYNTHROID ?TAKE 1 TABLET EVERY DAY ?  ?MAGNESIUM PO ?Take 1 tablet by mouth daily. ?  ?rosuvastatin 20 MG tablet ?Commonly known as: CRESTOR ?Take 1 tablet (20 mg total) by mouth at bedtime. ?  ?SYSTANE OP ?Apply 1 drop to eye 2 (two) times daily as needed (dry eyes). ?  ?vitamin  B-12 1000 MCG tablet ?Commonly known as: CYANOCOBALAMIN ?Take 1,000 mcg by mouth daily. ?  ?VITAMIN D3 PO ?Take 1 capsule by mouth 2 (two) times daily. ?  ?ZINC PO ?Take 1 tablet by mouth daily. ?  ? ?  ? ? Follow-up Information   ? ? [facility]. Schedule an appointment as soon as possible for a visit in 1 month(s).   ?Specialty: Neurology ?Why: stroke clinic ?Contact information: ?[address] [place] ?[phone] ? ?  ?  ? ? Marin Olp, MD. Schedule an appointment as soon as possible for a visit in 1 week(s).   ?Specialty: Family Medicine ?Contact information: ?[address] ?[place] [address] ?[phone] ? ? ?  ?  ? ?  ?  ? ?  ? ? ?Discharge Instructions   ? ? Ambulatory referral to Neurology   Complete by: As directed ?  ? Follow up with stroke clinic NP (Jessica Henderson or Cecille Rubin, if both not available, consider Zachery Dauer, or Ahern) at [facility] in about 4 weeks. Thanks.  ? Ambulatory referral to Physical Therapy   Complete by: As directed ?  ? Diagnosis: TIA G45. 9  ? Diet - low sodium heart healthy   Complete by: As directed ?  ? Discharge instructions   Complete by: As directed ?  ? From Dr. Loleta Books: ?You were admitted for a transient ischemic attack (TIA). ?This is an event that is similar to a precursor for a stroke. ?Here you had an MRI that showed no permanent damage to the brain. ?He also had a CT angiogram which visualized the blood vessels of the head and neck, and this showed several areas of fatty plaque buildup which likely contributed to the TIA. ? ?To reduce the risk of a stroke: ?Take aspirin 81 mg and clopidogrel/Plavix 75 mg both daily for 3 weeks ?After 3 weeks, stop Plavix, but continue low-dose aspirin 81 mg daily indefinitely ? ?Make sure that your blood pressure is well controlled with a goal of 120/80 ?I have sent a refill for your amlodipine/Norvasc ? ?Take medicine to reduce cholesterol. ?Start  with rosuvastatin/Crestor 20 mg nightly ?If you would like to "ease into it", you could try breaking it in half for the first week ?Taking the medicine at night often reduces symptoms for people ? ?Follow-up with Dr. Yong Channel in 1 week ?Follow-up with [facility] neurology, Dr. Erlinda Hong in 4 to 6 weeks ? ?Continue eating a healthy diet low in saturated fat and salt as you have been doing ? ? ?Note: The antacid omeprazole (Prilosec) interacts with Plavix and makes it less effective ?For the next 3 weeks, while you are taking Plavix, do NOT take omeprazole ?If you need an antacid, try famotidine/Pepcid, which is also over the counter.  Or  discuss with Dr. Yong Channel  ? Increase activity slowly   Complete by: As directed ?  ? ?  ? ? ?Discharge Exam: ?Filed Weights  ? 01/01/22 1100 01/01/22 1148  ?Weight: 63.8 kg 63.8 kg  ? ? ?General: Pt is alert, awake, not in acute distress ?Cardiovascular: RRR, nl S1-S2, no murmurs appreciated.   No LE edema.   ?Respiratory: Normal respiratory rate and rhythm.  CTAB without rales or wheezes. ?Abdominal: Abdomen soft and non-tender.  No distension or HSM.   ?Neuro/Psych: Strength symmetric in upper and lower extremities.  Judgment and insight appear normal. ? ? ?Condition at discharge: good ? ?The results of significant diagnostics from this hospitalization (including imaging, microbiology, ancillary and laboratory) are listed below for reference.  ? ?Imaging Studies: ?MR BRAIN WO CONTRAST ? ?Result Date: 01/01/2022 ?CLINICAL DATA:  Neuro deficit, acute, stroke suspected EXAM: MRI HEAD WITHOUT CONTRAST TECHNIQUE: Multiplanar, multiecho pulse sequences of the brain and surrounding structures were obtained without intravenous contrast. COMPARISON:  None Available. FINDINGS: Brain: There is no acute infarction or intracranial hemorrhage. There is no intracranial mass, mass effect, or edema. There is no hydrocephalus or extra-axial fluid collection. Prominence of the ventricles and sulci reflects  parenchymal volume loss. Patchy foci of T2 hyperintensity in the supratentorial and pontine white matter are nonspecific but may reflect mild chronic microvascular ischemic changes. Vascular: Major vessel flow voids

## 2022-01-02 NOTE — TOC Transition Note (Signed)
Transition of Care (TOC) - CM/SW Discharge Note ? ? ?Patient Details  ?Name: [name] ?MRN: [id number] ?Date of Birth: 1941/08/13 ? ?Transition of Care (TOC) CM/SW Contact:  ?Pollie Friar, RN ?Phone Number: ?01/02/2022, 10:26 AM ? ? ?Clinical Narrative:    ?Patient is from home with spouse who is with her most of the time. She doesn't use any DME at home. She drives self as needed.  ?She manages her own medications and denies any issues with home meds. Pharmacy of choice: [facility] in [place].  ?Recommendations for outpatient physical therapy. Pt currently is refusing.  ?Spouse will provide transport home once discharged.  ? ? ?Final next level of care: Home/Self Care ?Barriers to Discharge: No Barriers Identified ? ? ?Patient Goals and CMS Choice ?  ?  ?  ? ?Discharge Placement ?  ?           ?  ?  ?  ?  ? ?Discharge Plan and Services ?  ?  ?           ?  ?  ?  ?  ?  ?  ?  ?  ?  ?  ? ?Social Determinants of Health (SDOH) Interventions ?  ? ? ?Readmission Risk Interventions ?   ? View : No data to display.  ?  ?  ?  ? ? ? ? ? ?

## 2022-01-02 NOTE — Evaluation (Signed)
Occupational Therapy Evaluation and Discharge Summary ?Patient Details ?Name: [name] ?MRN: [id number] ?DOB: 31-May-1941 ?Today's Date: 01/02/2022 ? ? ?History of Present Illness Pt is an61 yo female admitted after experiencing L side weakness and numbness during a meal at home as well as visual disturbances and a h/a.  Pt symptoms resoloved by the tme she got to CT scan which was negative. Pt with possible TIA. PMH:  anxiety  ? ?Clinical Impression ?  ?Pt admitted with the above diagnosis and overall appears to have returned to her baseline level of functioning. Pt lives with her husband and is fully independent with all adls and iadls including driving. Pt's symptoms have cleared and pt is functioning at her baseline with all adls.  No further OT needed at this time.  ?  ?   ? ?Recommendations for follow up therapy are one component of a multi-disciplinary discharge planning process, led by the attending physician.  Recommendations may be updated based on patient status, additional functional criteria and insurance authorization.  ? ?Follow Up Recommendations ? No OT follow up  ?  ?Assistance Recommended at Discharge PRN  ?Patient can return home with the following Other (comment) (Pt does not need assist with anything to return home.) ? ?  ?Functional Status Assessment ? Patient has not had a recent decline in their functional status  ?Equipment Recommendations ? None recommended by OT  ?  ?Recommendations for Other Services   ? ? ?  ?Precautions / Restrictions Precautions ?Precautions: None ?Restrictions ?Weight Bearing Restrictions: No  ? ?  ? ?Mobility Bed Mobility ?Overal bed mobility: Independent ?  ?  ?  ?  ?  ?  ?General bed mobility comments: No assist needed. Bed flat and did not use bedrails ?  ? ?Transfers ?Overall transfer level: Independent ?Equipment used: None ?  ?  ?  ?  ?  ?  ?  ?General transfer comment: Pt ambulated in room, down hall and up and down 4 steps with no assist. ?  ? ?  ?Balance  Overall balance assessment: Independent ?  ?  ?  ?  ?  ?  ?  ?  ?  ?  ?  ?  ?  ?  ?  ?  ?  ?  ?   ? ?ADL either performed or assessed with clinical judgement  ? ?ADL Overall ADL's : Independent ?  ?  ?  ?  ?  ?  ?  ?  ?  ?  ?  ?  ?  ?  ?  ?  ?  ?  ?  ?General ADL Comments: Pt at baseline with all adls.  ? ? ? ?Vision Baseline Vision/History: 1 Wears glasses ?Ability to See in Adequate Light: 0 Adequate ?Patient Visual Report: No change from baseline ?Vision Assessment?: No apparent visual deficits ?Additional Comments: Pt had blurriness during episode but all vision intact on eval  ?   ?Perception   ?  ?Praxis   ?  ? ?Pertinent Vitals/Pain Pain Assessment ?Pain Assessment: No/denies pain  ? ? ? ?Hand Dominance Right ?  ?Extremity/Trunk Assessment Upper Extremity Assessment ?Upper Extremity Assessment: Overall WFL for tasks assessed ?  ?Lower Extremity Assessment ?Lower Extremity Assessment: Defer to PT evaluation ?  ?Cervical / Trunk Assessment ?Cervical / Trunk Assessment: Normal ?  ?Communication Communication ?Communication: No difficulties ?  ?Cognition Arousal/Alertness: Awake/alert ?Behavior During Therapy: [facility] for tasks assessed/performed ?Overall Cognitive Status: Within Functional Limits for tasks assessed ?  ?  ?  ?  ?  ?  ?  ?  ?  ?  ?  ?  ?  ?  ?  ?  ?  ?  ?  ?  General Comments  Pt completed all basic adls in room ambulating without a device with no assist. Pt fully independent at home and states she feels she is back to normal. ? ?  ?Exercises   ?  ?Shoulder Instructions    ? ? ?Home Living Family/patient expects to be discharged to:: Private residence ?Living Arrangements: Spouse/significant other ?Available Help at Discharge: Family;Available 24 hours/day ?Type of Home: House ?Home Access: Stairs to enter ?Entrance Stairs-Number of Steps: 2 ?Entrance Stairs-Rails: Left ?Home Layout: One level ?  ?  ?Bathroom Shower/Tub: Gaffer;Tub/shower unit ?  ?Bathroom Toilet: Standard ?Bathroom  Accessibility: Yes ?  ?Home Equipment: None ?  ?Additional Comments: has dog and drives ?  ? ?  ?Prior Functioning/Environment Prior Level of Function : Independent/Modified Independent ?  ?  ?  ?  ?  ?  ?  ?ADLs Comments: no assist ?  ? ?  ?  ?OT Problem List:   ?  ?   ?OT Treatment/Interventions:    ?  ?OT Goals(Current goals can be found in the care plan section) Acute Rehab OT Goals ?Patient Stated Goal: to find out why this happened ?OT Goal Formulation: All assessment and education complete, DC therapy  ?OT Frequency:   ?  ? ?Co-evaluation   ?  ?  ?  ?  ? ?  ?AM-PAC OT "6 Clicks" Daily Activity     ?Outcome Measure Help from another person eating meals?: None ?Help from another person taking care of personal grooming?: None ?Help from another person toileting, which includes using toliet, bedpan, or urinal?: None ?Help from another person bathing (including washing, rinsing, drying)?: None ?Help from another person to put on and taking off regular upper body clothing?: None ?Help from another person to put on and taking off regular lower body clothing?: None ?6 Click Score: 24 ?  ?End of Session Nurse Communication: Mobility status ? ?Activity Tolerance: Patient tolerated treatment well ?Patient left: in bed;with call bell/phone within reach ? ?OT Visit Diagnosis: Hemiplegia and hemiparesis ?Hemiplegia - Right/Left: Left ?Hemiplegia - caused by: Unspecified (TIA)  ?              ?Time: 3474-2595 ?OT Time Calculation (min): 15 min ?Charges:  OT General Charges ?$OT Visit: 1 Visit ?OT Evaluation ?$OT Eval Low Complexity: 1 Low ? ?Glenford Peers ?01/02/2022, 8:46 AM ?

## 2022-01-02 NOTE — Care Management Obs Status (Signed)
MEDICARE OBSERVATION STATUS NOTIFICATION ? ? ?Patient Details  ?Name: [name] ?MRN: [id number] ?Date of Birth: [birth year] ? ? ?Medicare Observation Status Notification Given:  Yes ? ? ? ?Pollie Friar, RN ?01/02/2022, 10:21 AM ?

## 2022-01-02 NOTE — Progress Notes (Signed)
Discharge instructions provided to patient. IV out. Monitor off CCMD notified. Discharging home with husband. ?

## 2022-01-02 NOTE — Evaluation (Signed)
Physical Therapy Evaluation ?Patient Details ?Name: [name] ?MRN: [id number] ?DOB: 25-Nov-1940 ?Today's Date: 01/02/2022 ? ?History of Present Illness ? Pt is a 81 y/o F s/p TIA resulting in L sided weakness/numbness, headache, and blurred vision. PMH includes arthritis, anxiety, HTN, hyperlipedemia, GERD, SVD, hx of colonoscopy, and thyroid disease.  ?Clinical Impression ? Received pt sitting EOB with ECHO team present, and pt requesting to use bathroom. Pt performed bed mobility independently throughout session and transfers with min guard initially, fading to close supervision. Pt ambulated to/from bathroom with min guard due to slight unsteadiness inially upon standing. Pt able to perform toileting tasks with supervision and void. Pt reported feeling "back to normal" and ready to D/C home. Have recommended OPPT due to mild balance deficits. ?  ? ?Recommendations for follow up therapy are one component of a multi-disciplinary discharge planning process, led by the attending physician.  Recommendations may be updated based on patient status, additional functional criteria and insurance authorization. ? ?Follow Up Recommendations Outpatient PT ? ?  ?Assistance Recommended at Discharge Intermittent Supervision/Assistance  ?Patient can return home with the following ? A little help with walking and/or transfers;Help with stairs or ramp for entrance ? ?  ?Equipment Recommendations None recommended by PT  ?Recommendations for Other Services ?    ?  ?Functional Status Assessment Patient has had a recent decline in their functional status and demonstrates the ability to make significant improvements in function in a reasonable and predictable amount of time.  ? ?  ?Precautions / Restrictions Precautions ?Precautions: None ?Restrictions ?Weight Bearing Restrictions: No  ? ?  ? ?Mobility ? Bed Mobility ?Overal bed mobility: Independent ?  ?  ?  ?  ?  ?  ?General bed mobility comments: No assist needed. Bed flat and did  not use bedrails ?Patient Response: Cooperative ? ?Transfers ?Overall transfer level: Needs assistance ?Equipment used: None ?Transfers: Sit to/from Stand ?Sit to Stand: Min guard ?  ?  ?  ?  ?  ?General transfer comment: stood from EOB with min guard, initally slightly unstable ?  ? ?Ambulation/Gait ?Ambulation/Gait assistance: Min guard ?Gait Distance (Feet): 10 Feet ?Assistive device: 1 person hand held assist ?Gait Pattern/deviations: Step-through pattern, Narrow base of support ?Gait velocity: slightly decreased ?Gait velocity interpretation: 1.31 - 2.62 ft/sec, indicative of limited community ambulator ?  ?General Gait Details: slow and cautious as it was her first time standing since admission ? ?Stairs ?  ?  ?  ?  ?  ? ?Wheelchair Mobility ?  ? ?Modified Rankin (Stroke Patients Only) ?Modified Rankin (Stroke Patients Only) ?Pre-Morbid Rankin Score: No symptoms ?Modified Rankin: Moderately severe disability ? ?  ? ?Balance Overall balance assessment: Needs assistance ?Sitting-balance support: Bilateral upper extremity supported, Feet supported ?Sitting balance-Leahy Scale: Normal ?Sitting balance - Comments: sitting EOB upon therapist's entry ?  ?Standing balance support: No upper extremity supported, During functional activity ?Standing balance-Leahy Scale: Fair ?Standing balance comment: able to maintain static standing balance with supervision but required min guard for dynamic standing balance ?  ?  ?  ?  ?  ?  ?  ?  ?  ?  ?  ?   ? ? ? ?Pertinent Vitals/Pain Pain Assessment ?Pain Assessment: No/denies pain  ? ? ?Home Living Family/patient expects to be discharged to:: Private residence ?Living Arrangements: Spouse/significant other ?Available Help at Discharge: Family;Available 24 hours/day ?Type of Home: House ?Home Access: Stairs to enter ?Entrance Stairs-Rails: Left ?Entrance Stairs-Number of Steps: 2 ?  ?Home  Layout: One level ?Home Equipment: None ?Additional Comments: has dog and drives  ?  ?Prior  Function Prior Level of Function : Independent/Modified Independent ?  ?  ?  ?  ?  ?  ?  ?ADLs Comments: no assist ?  ? ? ?Hand Dominance  ? Dominant Hand: Right ? ?  ?Extremity/Trunk Assessment  ? Upper Extremity Assessment ?Upper Extremity Assessment: Defer to OT evaluation ?  ? ?Lower Extremity Assessment ?Lower Extremity Assessment: Overall WFL for tasks assessed ?  ? ?Cervical / Trunk Assessment ?Cervical / Trunk Assessment: Normal  ?Communication  ? Communication: No difficulties  ?Cognition Arousal/Alertness: Awake/alert ?Behavior During Therapy: [facility] for tasks assessed/performed ?Overall Cognitive Status: Within Functional Limits for tasks assessed ?  ?  ?  ?  ?  ?  ?  ?  ?  ?  ?  ?  ?  ?  ?  ?  ?  ?  ?  ? ?  ?General Comments General comments (skin integrity, edema, etc.): pt initally slightty unsteady upon standing but after walking around, reported feeling like she is back to normal ? ?  ?Exercises    ? ?Assessment/Plan  ?  ?PT Assessment Patient needs continued PT services  ?PT Problem List Decreased balance ? ?   ?  ?PT Treatment Interventions Gait training;Stair training;Functional mobility training;DME instruction;Therapeutic activities;Therapeutic exercise;Balance training;Neuromuscular re-education;Patient/family education   ? ?PT Goals (Current goals can be found in the Care Plan section)  ?Acute Rehab PT Goals ?Patient Stated Goal: to go home ?PT Goal Formulation: With patient ?Time For Goal Achievement: 01/09/22 ?Potential to Achieve Goals: Good ? ?  ?Frequency Min 4X/week ?  ? ? ?Co-evaluation   ?  ?  ?  ?  ? ? ?  ?AM-PAC PT "6 Clicks" Mobility  ?Outcome Measure Help needed turning from your back to your side while in a flat bed without using bedrails?: None ?Help needed moving from lying on your back to sitting on the side of a flat bed without using bedrails?: None ?Help needed moving to and from a bed to a chair (including a wheelchair)?: A Little ?Help needed standing up from a chair using  your arms (e.g., wheelchair or bedside chair)?: A Little ?Help needed to walk in hospital room?: A Little ?Help needed climbing 3-5 steps with a railing? : A Little ?6 Click Score: 20 ? ?  ?End of Session Equipment Utilized During Treatment: Gait belt ?Activity Tolerance: Patient tolerated treatment well ?Patient left: in bed;with call bell/phone within reach;with bed alarm set;Other (comment) (with ECHO present) ?  ?PT Visit Diagnosis: Unsteadiness on feet (R26.81);Muscle weakness (generalized) (M62.81);Hemiplegia and hemiparesis ?Hemiplegia - Right/Left: Left ?Hemiplegia - dominant/non-dominant: Non-dominant ?Hemiplegia - caused by:  (TIA) ?  ? ?Time: 1610-9604 ?PT Time Calculation (min) (ACUTE ONLY): 10 min ? ? ?Charges:   PT Evaluation ?$PT Eval Low Complexity: 1 Low ?  ?  ?   ? ? ?Becky Sax PT, DPT  ?Blenda Nicely ?01/02/2022, 9:26 AM ? ?

## 2022-01-02 NOTE — Progress Notes (Signed)
?  Echocardiogram ?2D Echocardiogram has been performed. ? ?[name] ?01/02/2022, 8:25 AM ?

## 2022-01-04 ENCOUNTER — Ambulatory Visit (INDEPENDENT_AMBULATORY_CARE_PROVIDER_SITE_OTHER): Payer: Medicare HMO | Admitting: Family Medicine

## 2022-01-04 ENCOUNTER — Ambulatory Visit: Payer: Medicare HMO | Admitting: Family Medicine

## 2022-01-04 VITALS — BP 164/78 | HR 65 | Temp 98.2°F | Ht 66.0 in | Wt 132.2 lb

## 2022-01-04 DIAGNOSIS — G459 Transient cerebral ischemic attack, unspecified: Secondary | ICD-10-CM | POA: Diagnosis not present

## 2022-01-04 DIAGNOSIS — I10 Essential (primary) hypertension: Secondary | ICD-10-CM

## 2022-01-04 DIAGNOSIS — E7800 Pure hypercholesterolemia, unspecified: Secondary | ICD-10-CM

## 2022-01-04 MED ORDER — AMLODIPINE BESYLATE 10 MG PO TABS: 10.0000 mg | ORAL_TABLET | Freq: Every day | ORAL | 3 refills | Status: DC

## 2022-01-04 NOTE — Progress Notes (Signed)
? ? ?Chief Complaint:  ?[name] is a 81 y.o. female who presents today for a TCM visit. ? ?Assessment/Plan:  ?New/Acute Problems: ?TIA ?No recurrence of symptoms since discharge.  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  We discussed importance of risk factor modification to prevent future stroke.  It sounds as if she had a bad reaction to the Crestor and she has not taken since being discharged.  She has had similar bad reactions to simvastatin in the past.  Advised patient to restart her Plavix and take for a full 21-day course.  She will do this in addition to the aspirin.  We will be addressing her risk factors as below.  We discussed reasons to return to care and seek emergent care. ? ?Chronic Problems Addressed Today: ?Hypertension  ?Not controlled today.  She has been persistently elevated for the last several weeks at least.  We will increase her amlodipine to 10 mg daily.  She will continue to monitor at home.  She will follow-up with her PCP in a couple of weeks to adjust medications as needed ? ?Dyslipidemia ?She did not tolerate her Crestor due to side effects.  She will hold off on any statin for now until she follows up with her PCP in a couple of weeks.  We discussed importance of good lipid control with direct reduce risk of future heart attack and stroke however will defer further medication management to her PCP. ? ? ?  ?Subjective:  ?HPI: ? ?Summary of Hospital admission: ?Reason for admission: TIA ?Date of admission: 01/01/2022 ?Date of discharge: 01/02/2022 ?Date of Interactive contact: N/A today is within 2 days of discharge ?Summary of Hospital course: Patient presented to the ED on 01/01/2022 with sudden onset headache, blurred vision, and left side hemiparesis.  Symptoms resolved in the emergency room.  CTA was obtained which shows severe multifocal intracranial cerebrovascular disease.   She was admitted for TIA.  MRI did not show any acute stroke.  Her carotid ultrasound was normal and her echocardiogram was normal as well.  She was discharged on dual antiplatelets for 3 weeks to be followed by 81 mg aspirin daily indefinitely. ? ?Interim history:  ? ?Since being home she has not had recurrence of symptoms.  She is concerned that she had a bad reaction to the medications that she was started on.  After coming home from the hospital she had an episode of severe muscle pain including neck pain and shaking.  This resolved spontaneously after a few hours.  She is not sure if it is due to the Plavix or the cholesterol medication however she thinks it may be due to the Crestor she has had bad reactions to statins in the past.  She has been compliant with her other medications which have been well-tolerated.   ? ? ? ? ?ROS: Per HPI, otherwise a complete review of systems was negative.  ? ?PMH: ? ?The following were reviewed and entered/updated in epic: ?Past Medical History:  ?Diagnosis Date  ? Anxiety   ? Arthritis   ? neck, back  ? Hx of colonoscopy with polypectomy   ? Hyperlipidemia   ? diet controlled  ? 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 SVD (spontaneous vaginal delivery)   ? x 4  ? Thyroid disease   ? ?Patient Active Problem List  ? Diagnosis Date Noted  ? TIA (transient ischemic  attack) 01/01/2022  ? Osteopenia 10/03/2020  ? GERD (gastroesophageal reflux disease) 02/07/2020  ? Atypical chest pain 01/06/2020  ? GAD (generalized anxiety disorder) 06/01/2018  ? Essential hypertension 06/01/2018  ? Other and unspecified ovarian cyst 10/25/2013  ? Vaginal atrophy 06/03/2013  ? Hypercholesterolemia 12/10/2011  ? Hx of colonic polyps 12/10/2011  ? Hypothyroidism 12/10/2011  ? ?Past Surgical History:  ?Procedure Laterality Date  ? ABDOMINAL HYSTERECTOMY    ? APPENDECTOMY    ? BREAST SURGERY Right   ? LUMPECTOMY- FIBROID TUMOR benign  ? COLONOSCOPY    ? hx polyps/ [facility]-mother   ? ENTEROCELE REPAIR    ?  EXCISIONAL HEMORRHOIDECTOMY    ? fnger surgery  04/2018  ? POLYPECTOMY    ? ? ?Family History  ?Problem Relation Age of Onset  ? Colon cancer Mother 58  ? Anxiety disorder Mother   ? Deep vein thrombosis Mother   ?     died age 5  ? Heart disease Father 18  ?     smoker, high stress job  ? Colon cancer Brother 41  ? Colon cancer Maternal Aunt 55  ? Colon cancer Son 71  ? Colon cancer Paternal Grandmother   ? Colon cancer Other 48  ? Hodgkin's lymphoma Brother 58  ? Breast cancer Daughter   ? Colon polyps Neg Hx   ? Esophageal cancer Neg Hx   ? Rectal cancer Neg Hx   ? Stomach cancer Neg Hx   ? ? ?Medications- Reconciled discharge and current medications in Epic.  ?Current Outpatient Medications  ?Medication Sig Dispense Refill  ? ALPRAZolam (XANAX) 0.25 MG tablet TAKE 1 TO 2 TABLETS TWICE DAILY AS NEEDED FOR ANXIETY (Patient taking differently: Take 0.25 mg by mouth 2 (two) times daily as needed for anxiety.) 120 tablet 0  ? aspirin 81 MG EC tablet Take 1 tablet (81 mg total) by mouth daily. Swallow whole. 30 tablet 11  ? Aspirin-Acetaminophen-Caffeine (GOODY HEADACHE PO) Take 1 packet by mouth daily as needed (headache).    ? busPIRone (BUSPAR) 5 MG tablet TAKE 1 TABLET THREE TIMES DAILY (Patient taking differently: Take 5 mg by mouth in the morning, at noon, and at bedtime.) 270 tablet 3  ? Cholecalciferol (VITAMIN D3 PO) Take 1 capsule by mouth 2 (two) times daily.    ? clopidogrel (PLAVIX) 75 MG tablet Take 1 tablet (75 mg total) by mouth daily for 21 days. 21 tablet 0  ? levothyroxine (SYNTHROID) 75 MCG tablet TAKE 1 TABLET EVERY DAY (Patient taking differently: Take 75 mcg by mouth daily.) 90 tablet 3  ? MAGNESIUM PO Take 1 tablet by mouth daily.    ? Multiple Vitamins-Minerals (ZINC PO) Take 1 tablet by mouth daily.    ? Polyethyl Glycol-Propyl Glycol (SYSTANE OP) Apply 1 drop to eye 2 (two) times daily as needed (dry eyes).    ? rosuvastatin (CRESTOR) 20 MG tablet Take 1 tablet (20 mg total) by mouth at  bedtime. 30 tablet 3  ? vitamin B-12 (CYANOCOBALAMIN) 1000 MCG tablet Take 1,000 mcg by mouth daily.    ? amLODipine (NORVASC) 10 MG tablet Take 1 tablet (10 mg total) by mouth daily. 90 tablet 3  ? ?No current facility-administered medications for this visit.  ? ? ?Allergies-reviewed and updated ?Allergies  ?Allergen Reactions  ? Shellfish Allergy Nausea And Vomiting and Other (See Comments)  ?  Severe stomach pains, fever, sweating  ? Simvastatin Other (See Comments)  ?  Myalgia  ? ? ?Social History  ? ?  Socioeconomic History  ? Marital status: Married  ?  Spouse name: Not on file  ? Number of children: 4  ? Years of education: Not on file  ? Highest education level: Not on file  ?Occupational History  ? Not on file  ?Tobacco Use  ? Smoking status: Former  ?  Packs/day: 1.00  ?  Years: 13.00  ?  Pack years: 13.00  ?  Types: Cigarettes  ?  Start date: 09/02/1965  ?  Quit date: 09/02/1978  ?  Years since quitting: 43.3  ? Smokeless tobacco: Never  ? Tobacco comments:  ?  quit in 1980  ?Vaping Use  ? Vaping Use: Never used  ?Substance and Sexual Activity  ? Alcohol use: No  ?  Alcohol/week: 0.0 standard drinks  ? Drug use: No  ? Sexual activity: Not on file  ?Other Topics Concern  ? Not on file  ?Social History Narrative  ? Married 61 years in 2019. 4 children, 3 living. 5 grandkids.   ? Poodle 12.5 in 2019.   ?   ? Housewife.   ? Some college- [employer]  ?   ? Hobbies: travel, walking, work out in yard  ? ?Social Determinants of Health  ? ?Financial Resource Strain: Not on file  ?Food Insecurity: Not on file  ?Transportation Needs: Not on file  ?Physical Activity: Not on file  ?Stress: Not on file  ?Social Connections: Not on file  ? ?   ?  ?Objective:  ?Physical Exam: ?BP (!) 164/78 (BP Location: Left Arm) Comment (BP Location): manually  Pulse 65   Temp 98.2 ?F (36.8 ?C) (Temporal)   Ht '5\' 6"'$  (1.676 m)   Wt 132 lb 3.2 oz (60 kg)   LMP  (LMP Unknown)   SpO2 99%   BMI 21.34 kg/m?   ?Gen: NAD, resting comfortably ?CV:  RRR with no murmurs appreciated ?Pulm: NWOB, CTAB with no crackles, wheezes, or rhonchi ?GI: Normal bowel sounds present. Soft, Nontender, Nondistended. ?MSK: No edema, cyanosis, or clubbing noted ?Skin: Warm, dry ?Neuro

## 2022-01-04 NOTE — Patient Instructions (Signed)
It was very nice to see you today! ? ?Please increase your amlodipine to 10 mg daily. ? ?Please restart the Plavix. ? ?Please follow-up with Dr. Yong Channel in a couple of weeks to recheck your blood pressure and also discuss alternatives for cholesterol management. ? ?Please let us know if you have any recurrence of symptoms. ? ?Take care, ?Dr Jerline Pain ? ?PLEASE NOTE: ? ?If you had any lab tests please let us know if you have not heard back within a few days. You may see your results on mychart before we have a chance to review them but we will give you a call once they are reviewed by [place]. If we ordered any referrals today, please let us know if you have not heard from their office within the next week.  ? ?Please try these tips to maintain a healthy lifestyle: ? ?Eat at least 3 REAL meals and 1-2 snacks per day.  Aim for no more than 5 hours between eating.  If you eat breakfast, please do so within one hour of getting up.  ? ?Each meal should contain half fruits/vegetables, one quarter protein, and one quarter carbs (no bigger than a computer mouse) ? ?Cut down on sweet beverages. This includes juice, soda, and sweet tea.  ? ?Drink at least 1 glass of water with each meal and aim for at least 8 glasses per day ? ?Exercise at least 150 minutes every week.   ?

## 2022-01-14 ENCOUNTER — Ambulatory Visit (INDEPENDENT_AMBULATORY_CARE_PROVIDER_SITE_OTHER): Payer: Medicare HMO | Admitting: Family Medicine

## 2022-01-14 ENCOUNTER — Encounter: Payer: Self-pay | Admitting: Family Medicine

## 2022-01-14 VITALS — BP 160/72 | HR 68 | Temp 98.0°F | Ht 66.0 in | Wt 134.0 lb

## 2022-01-14 DIAGNOSIS — I10 Essential (primary) hypertension: Secondary | ICD-10-CM

## 2022-01-14 DIAGNOSIS — Z8673 Personal history of transient ischemic attack (TIA), and cerebral infarction without residual deficits: Secondary | ICD-10-CM | POA: Diagnosis not present

## 2022-01-14 DIAGNOSIS — Z1231 Encounter for screening mammogram for malignant neoplasm of breast: Secondary | ICD-10-CM

## 2022-01-14 DIAGNOSIS — E039 Hypothyroidism, unspecified: Secondary | ICD-10-CM | POA: Diagnosis not present

## 2022-01-14 DIAGNOSIS — E7800 Pure hypercholesterolemia, unspecified: Secondary | ICD-10-CM | POA: Diagnosis not present

## 2022-01-14 MED ORDER — VALSARTAN 80 MG PO TABS: 80.0000 mg | ORAL_TABLET | Freq: Every day | ORAL | 5 refills | Status: DC

## 2022-01-14 MED ORDER — PITAVASTATIN CALCIUM 1 MG PO TABS
1.0000 mg | ORAL_TABLET | Freq: Every day | ORAL | 3 refills | Status: DC
Start: 2022-01-14 — End: 2022-02-14

## 2022-01-14 MED ORDER — AMLODIPINE BESYLATE 5 MG PO TABS: 5.0000 mg | ORAL_TABLET | Freq: Every day | ORAL | 3 refills | Status: DC

## 2022-01-14 NOTE — Patient Instructions (Addendum)
Call [facility] [phone]) and schedule your mammogram. ? ?Trial Livalo/ pitavastatin at 1 mg and if this causes side effects STOP and we will need to look at non statin options ?- call [facility] today or tomorrow to check on price- I think you may need prior auth first.  ? ?Tonight take 5 mg of amlodipine (either half of 10 mg or full 5 mg) ? ?I also want her to start valsartan 80 mg from local pharmay tomorrow morning ? ?Recommended follow up: Return in about 1 month (around 02/14/2022) for followup or sooner if needed.Schedule b4 you leave. To check on blood pressure BUT I want you controlled at home even before visit so let me know if running >135/85- honestly under 130/80 is even better.  ?

## 2022-01-14 NOTE — Progress Notes (Signed)
?Phone [phone] ?In person visit ?  ?Subjective:  ? ?[name] is a 81 y.o. year old very pleasant female patient who presents for/with See problem oriented charting ?Chief Complaint  ?Patient presents with  ? Follow-up  ? Hypertension  ?  Pt states amlodipine is causing feet and legs to hurt big time.  ? Hypothyroidism  ? tick bite on left leg  ?  Pt c/o tick bite on left leg that happened yesterday,  ? crestor  ?  Pt wants to discuss side effects of crestor that she has had.  ? ? ?Past Medical History-  ?Patient Active Problem List  ? Diagnosis Date Noted  ? History of TIA (transient ischemic attack) 01/01/2022  ?  Priority: High  ? GERD (gastroesophageal reflux disease) 02/07/2020  ?  Priority: Medium   ? GAD (generalized anxiety disorder) 06/01/2018  ?  Priority: Medium   ? Essential hypertension 06/01/2018  ?  Priority: Medium   ? Hypercholesterolemia 12/10/2011  ?  Priority: Medium   ? Hypothyroidism 12/10/2011  ?  Priority: Medium   ? Other and unspecified ovarian cyst 10/25/2013  ?  Priority: Low  ? Vaginal atrophy 06/03/2013  ?  Priority: Low  ? Hx of colonic polyps 12/10/2011  ?  Priority: Low  ? Osteopenia 10/03/2020  ? Atypical chest pain 01/06/2020  ? ? ?Medications- reviewed and updated ?Current Outpatient Medications  ?Medication Sig Dispense Refill  ? ALPRAZolam (XANAX) 0.25 MG tablet TAKE 1 TO 2 TABLETS TWICE DAILY AS NEEDED FOR ANXIETY (Patient taking differently: Take 0.25 mg by mouth 2 (two) times daily as needed for anxiety.) 120 tablet 0  ? amLODipine (NORVASC) 5 MG tablet Take 1 tablet (5 mg total) by mouth daily. 90 tablet 3  ? aspirin 81 MG EC tablet Take 1 tablet (81 mg total) by mouth daily. Swallow whole. 30 tablet 11  ? Aspirin-Acetaminophen-Caffeine (GOODY HEADACHE PO) Take 1 packet by mouth daily as needed (headache).    ? busPIRone (BUSPAR) 5 MG tablet TAKE 1 TABLET THREE TIMES DAILY (Patient taking differently: Take 5 mg by mouth in the morning, at noon, and at bedtime.) 270  tablet 3  ? Cholecalciferol (VITAMIN D3 PO) Take 1 capsule by mouth 2 (two) times daily.    ? clopidogrel (PLAVIX) 75 MG tablet Take 1 tablet (75 mg total) by mouth daily for 21 days. 21 tablet 0  ? levothyroxine (SYNTHROID) 75 MCG tablet TAKE 1 TABLET EVERY DAY (Patient taking differently: Take 75 mcg by mouth daily.) 90 tablet 3  ? MAGNESIUM PO Take 1 tablet by mouth daily.    ? Multiple Vitamins-Minerals (ZINC PO) Take 1 tablet by mouth daily.    ? Pitavastatin Calcium 1 MG TABS Take 1 tablet (1 mg total) by mouth daily. 90 tablet 3  ? Polyethyl Glycol-Propyl Glycol (SYSTANE OP) Apply 1 drop to eye 2 (two) times daily as needed (dry eyes).    ? valsartan (DIOVAN) 80 MG tablet Take 1 tablet (80 mg total) by mouth daily. 30 tablet 5  ? vitamin B-12 (CYANOCOBALAMIN) 1000 MCG tablet Take 1,000 mcg by mouth daily.    ? ?No current facility-administered medications for this visit.  ? ?  ?Objective:  ?BP (!) 160/72   Pulse 68   Temp 98 ?F (36.7 ?C)   Ht '5\' 6"'$  (1.676 m)   Wt 134 lb (60.8 kg)   LMP  (LMP Unknown)   SpO2 98%   BMI 21.63 kg/m?  ?Gen: NAD, resting comfortably ?  CV: RRR no murmurs rubs or gallops ?Lungs: CTAB no crackles, wheeze, rhonchi ?Ext: minimal edema ?Skin: warm, dry ?  ? ?Assessment and Plan  ? ?#History of TIA  ?#hyperlipidemia ?S: Medication: Currently no medication ?-Statin intolerant in the past- depressed mood on simvastatin 10 mg.  ?Most recently ?- with crestor- shaky, jerking- same day getting home from hospital  ? ?In regards to her Plavix-she did start taking this but did not take it in last 24 hours-encouraged her to restart today and continue aspirin 81 mg ? ? A/P: For history of TIA-complete 3 weeks of Plavix and aspirin together ?-Keep upcoming neurology visit.  Thankful now residual deficits-presenting symptoms of left sided weakness and numbness resolved while hospitalized ? ?For hyperlipidemia-intolerant to both simvastatin and Crestor.  We will trial Livalo if we can get this  approved.  Could potentially add Zetia if needed.  Consider lipid clinic referral through cardiology otherwise ? ?#hypertension-new diagnosis 12/18/2020 with history of whitecoat hypertension ?S: medication: amlodipine 2.5 mg and 5 mg she tolerated but blood pressure was not controlled. Went up to 10 mg and blood pressure was normal BUT had significant swelling and had to stop medicine- today she has not had any medicine ?Home readings #s: previously home readings were well controlled. Now multiple readings over 140 and even 150. 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 ?#hypothyroidism ?S: compliant On thyroid medication-Levothyroxine 50mg ?Lab Results  ?Component Value Date  ? TSH 0.654 01/01/2022  ?A/P:Controlled. Continue current medications.  ? ?##Tick bite on left leg-noted yesterday and removed the tick- did not feel could get head.  Discussed risk of Lyme disease and recommended spotted fever-she did request that I try to remove the "bad"-removed superficial layer of skin and was able to express slight brown discharge ? ? ?Recommended follow up: Return in about 1 month (around 02/14/2022) for followup or sooner if needed.Schedule b4 you leave. ?Future Appointments  ?Date Time Provider [place] ?02/05/2022  1:30 PM MWard Givens NP [facility] None  ?02/14/2022  1:20 PM HMarin Olp MD [facility]  ?06/07/2022  1:00 PM HYong ChannelSBrayton Mars  MD [facility]  ? ? ?Lab/Order associations: ?  ICD-10-CM   ?1. Essential hypertension  I10   ?  ?2. Hypercholesterolemia  E78.00   ?  ?3. Encounter for screening mammogram for malignant neoplasm of breast  Z12.31 MM Digital Screening  ?  ?4. Acquired hypothyroidism  E03.9   ?  ?5. History of TIA (transient ischemic attack)  Z86.73   ?  ? ? ?Meds ordered this encounter  ?Medications  ? Pitavastatin Calcium 1 MG TABS  ?  Sig: Take 1 tablet (1 mg total) by mouth daily.  ?  Dispense:  90 tablet  ?  Refill:  3  ? amLODipine (NORVASC) 5 MG tablet  ?  Sig: Take 1 tablet (5 mg total) by mouth daily.  ?  Dispense:  90 tablet  ?  Refill:  3  ? valsartan (DIOVAN) 80 MG tablet  ?  Sig: Take 1 tablet (80 mg total) by mouth daily.  ?  Dispense:  30 tablet  ?  Refill:  5  ? ? ?Return precautions advised.  ?Garret Reddish, MD ? ?

## 2022-01-15 ENCOUNTER — Telehealth: Payer: Self-pay | Admitting: Family Medicine

## 2022-01-15 DIAGNOSIS — Z8673 Personal history of transient ischemic attack (TIA), and cerebral infarction without residual deficits: Secondary | ICD-10-CM

## 2022-01-15 DIAGNOSIS — Z789 Other specified health status: Secondary | ICD-10-CM

## 2022-01-15 DIAGNOSIS — E785 Hyperlipidemia, unspecified: Secondary | ICD-10-CM

## 2022-01-15 NOTE — Telephone Encounter (Signed)
FYI

## 2022-01-15 NOTE — Telephone Encounter (Signed)
Please refer to cardiology under hyperlipidemia, history of stroke, statin intolerant and write in the comments lipid clinic ?

## 2022-01-15 NOTE — Telephone Encounter (Signed)
Pt states the Livalo is too expensive (>$250 cost to pt for 90 day supply.) PT declines this prescription. ? ?Pt states, so far, the medication taken last night there has been no side effects. Pt states this was the valsartan. ? ?Pt states she will call back if she experiences any side effects. ?

## 2022-01-16 ENCOUNTER — Other Ambulatory Visit (HOSPITAL_COMMUNITY): Payer: Self-pay

## 2022-01-16 NOTE — Telephone Encounter (Signed)
Referral has been placed. 

## 2022-01-18 ENCOUNTER — Telehealth: Payer: Self-pay | Admitting: Cardiology

## 2022-01-18 ENCOUNTER — Telehealth: Payer: Self-pay | Admitting: Family Medicine

## 2022-01-18 DIAGNOSIS — E785 Hyperlipidemia, unspecified: Secondary | ICD-10-CM

## 2022-01-18 NOTE — Telephone Encounter (Signed)
Pt states "the hospital called saying Dr. Ansel Bong office called to have them set up an appointment with Dr. Ellyn Hack."  Pt states she is wary of this situation because "Dr. Ansel Bong office would have called me directly."  Pt also states she had an ECG at the hospital and doesn't understand why she would need to go to the cardiologist again.  Pt would like a call back to confirm the message she was sent.

## 2022-01-18 NOTE — Telephone Encounter (Addendum)
She should contact her PCP since they were the ones managing this.  Dr. Yong Channel wanted patient to be seen by Lipid clinic. I will confirm if he wanted her seen by PharmD or Dr. Debara Pickett. Pt should keep apt with Dr. Ellyn Hack since she has not seen him in 2 years and needs to be an established patient to see PharmD.

## 2022-01-18 NOTE — Telephone Encounter (Signed)
See below regarding ECG. The message before that came from the prior phone note when you wanted me to place the referral to cardiology.

## 2022-01-18 NOTE — Telephone Encounter (Signed)
Called patient, she has not been seen by [place] for some time- however, she did make an appointment for follow up on 02/05/22 with Dr.Harding.  She states Dr.Hunter (PCP) has made some med changes, however they tried to increase her Amlodipine (had swelling) so they decreased it back to 5 mg, but patient states it has not improved. She denies eating salt, and when she does elevate the legs it does not improve a lot. I advised I would route to [facility] to review note to see if we could assist, otherwise she should keep upcoming appointment.

## 2022-01-18 NOTE — Telephone Encounter (Signed)
Pt c/o swelling: STAT is pt has developed SOB within 24 hours  How much weight have you gained and in what time span?  No weight gain  If swelling, where is the swelling located?  Feet and legs  Are you currently taking a fluid pill?  No   Are you currently SOB?  No  Do you have a log of your daily weights (if so, list)?  No   Have you gained 3 pounds in a day or 5 pounds in a week?  No   Have you traveled recently?  No, but patient tries to elevated her legs throughout the day when she is able.  I scheduled a hospital follow-up for 6/06 with Dr. Ellyn Hack.

## 2022-01-18 NOTE — Telephone Encounter (Signed)
I do not fully understand the message below.  I did not specifically request Dr. Legrand Pitts wanted the patient to see the lipid clinic-can we call cardiology to check on this?  This is not about the EKG it is about getting her cholesterol down since she cannot tolerate the medicines we have tried

## 2022-01-20 ENCOUNTER — Emergency Department (HOSPITAL_COMMUNITY)
Admission: EM | Admit: 2022-01-20 | Discharge: 2022-01-20 | Disposition: A | Discharge status: Home / Self Care | Payer: Medicare HMO | Attending: Emergency Medicine | Admitting: Emergency Medicine

## 2022-01-20 ENCOUNTER — Other Ambulatory Visit: Payer: Self-pay

## 2022-01-20 ENCOUNTER — Encounter (HOSPITAL_COMMUNITY): Payer: Self-pay | Admitting: Emergency Medicine

## 2022-01-20 DIAGNOSIS — Z7982 Long term (current) use of aspirin: Secondary | ICD-10-CM | POA: Diagnosis not present

## 2022-01-20 DIAGNOSIS — Z8673 Personal history of transient ischemic attack (TIA), and cerebral infarction without residual deficits: Secondary | ICD-10-CM | POA: Insufficient documentation

## 2022-01-20 DIAGNOSIS — Z79899 Other long term (current) drug therapy: Secondary | ICD-10-CM | POA: Diagnosis not present

## 2022-01-20 DIAGNOSIS — Z7901 Long term (current) use of anticoagulants: Secondary | ICD-10-CM | POA: Insufficient documentation

## 2022-01-20 DIAGNOSIS — K922 Gastrointestinal hemorrhage, unspecified: Secondary | ICD-10-CM | POA: Insufficient documentation

## 2022-01-20 DIAGNOSIS — R531 Weakness: Secondary | ICD-10-CM | POA: Diagnosis not present

## 2022-01-20 DIAGNOSIS — I10 Essential (primary) hypertension: Secondary | ICD-10-CM | POA: Diagnosis not present

## 2022-01-20 DIAGNOSIS — R5383 Other fatigue: Secondary | ICD-10-CM

## 2022-01-20 DIAGNOSIS — E871 Hypo-osmolality and hyponatremia: Secondary | ICD-10-CM | POA: Diagnosis not present

## 2022-01-20 DIAGNOSIS — R195 Other fecal abnormalities: Secondary | ICD-10-CM | POA: Diagnosis present

## 2022-01-20 DIAGNOSIS — E039 Hypothyroidism, unspecified: Secondary | ICD-10-CM | POA: Insufficient documentation

## 2022-01-20 LAB — URINALYSIS, ROUTINE W REFLEX MICROSCOPIC
Bacteria, UA: NONE SEEN
Bilirubin Urine: NEGATIVE
Glucose, UA: NEGATIVE mg/dL
Ketones, ur: 5 mg/dL — AB
Leukocytes,Ua: NEGATIVE
Nitrite: NEGATIVE
Protein, ur: NEGATIVE mg/dL
Specific Gravity, Urine: 1.005 (ref 1.005–1.030)
pH: 8 (ref 5.0–8.0)

## 2022-01-20 LAB — CBC WITH DIFFERENTIAL/PLATELET
Abs Immature Granulocytes: 0.03 10*3/uL (ref 0.00–0.07)
Basophils Absolute: 0 10*3/uL (ref 0.0–0.1)
Basophils Relative: 1 %
Eosinophils Absolute: 0.1 10*3/uL (ref 0.0–0.5)
Eosinophils Relative: 1 %
HCT: 43 % (ref 36.0–46.0)
Hemoglobin: 13.8 g/dL (ref 12.0–15.0)
Immature Granulocytes: 0 %
Lymphocytes Relative: 20 %
Lymphs Abs: 1.5 10*3/uL (ref 0.7–4.0)
MCH: 30.8 pg (ref 26.0–34.0)
MCHC: 32.1 g/dL (ref 30.0–36.0)
MCV: 96 fL (ref 80.0–100.0)
Monocytes Absolute: 0.3 10*3/uL (ref 0.1–1.0)
Monocytes Relative: 4 %
Neutro Abs: 5.6 10*3/uL (ref 1.7–7.7)
Neutrophils Relative %: 74 %
Platelets: 208 10*3/uL (ref 150–400)
RBC: 4.48 MIL/uL (ref 3.87–5.11)
RDW: 12.1 % (ref 11.5–15.5)
WBC: 7.6 10*3/uL (ref 4.0–10.5)
nRBC: 0.3 % — ABNORMAL HIGH (ref 0.0–0.2)

## 2022-01-20 LAB — COMPREHENSIVE METABOLIC PANEL
ALT: 14 U/L (ref 0–44)
AST: 15 U/L (ref 15–41)
Albumin: 3.9 g/dL (ref 3.5–5.0)
Alkaline Phosphatase: 67 U/L (ref 38–126)
Anion gap: 8 (ref 5–15)
BUN: 13 mg/dL (ref 8–23)
CO2: 24 mmol/L (ref 22–32)
Calcium: 9.2 mg/dL (ref 8.9–10.3)
Chloride: 101 mmol/L (ref 98–111)
Creatinine, Ser: 0.99 mg/dL (ref 0.44–1.00)
GFR, Estimated: 57 mL/min — ABNORMAL LOW (ref >60)
Glucose, Bld: 98 mg/dL (ref 70–99)
Potassium: 4.3 mmol/L (ref 3.5–5.1)
Sodium: 133 mmol/L — ABNORMAL LOW (ref 135–145)
Total Bilirubin: 0.7 mg/dL (ref 0.3–1.2)
Total Protein: 6.6 g/dL (ref 6.5–8.1)

## 2022-01-20 LAB — POC OCCULT BLOOD, ED: Fecal Occult Bld: NEGATIVE

## 2022-01-20 NOTE — ED Provider Notes (Signed)
[facility] DEPART[facility] Provider Note   CSN: [id number] Arrival date & time: 01/20/22  1416     History  Chief Complaint  Patient presents with   Melena    AMYLA HEFFNER is a 81 y.o. female with a history of hypothyroidism, GAD, HTN, GERD, and recent TIA presenting to the ED with blood in her stool.  Patient states that she has had ongoing issues with constipation for the past 4 to 6 months.  On Friday, after straining to have a bowel movement, she noted blood in the toilet.  She continued to have constipation through Saturday, but took a laxative Saturday night and had a normal bowel movement today without any blood noted.  She is complaining of some mild pain in the rectum.  She also endorses fatigue and lightheadedness that started yesterday.  She states the lightheadedness typically occurs with standing or with exertion.  Denies any chest pain or shortness of breath.  No fevers, cough, or other infectious symptoms.  HPI     Home Medications Prior to Admission medications   Medication Sig Start Date End Date Taking? Authorizing Provider  ALPRAZolam (XANAX) 0.25 MG tablet TAKE 1 TO 2 TABLETS TWICE DAILY AS NEEDED FOR ANXIETY Patient taking differently: Take 0.25 mg by mouth 2 (two) times daily as needed for anxiety. 12/24/21   Marin Olp, MD  amLODipine (NORVASC) 5 MG tablet Take 1 tablet (5 mg total) by mouth daily. 01/14/22   Marin Olp, MD  aspirin 81 MG EC tablet Take 1 tablet (81 mg total) by mouth daily. Swallow whole. 01/03/22   Danford, Suann Larry, MD  Aspirin-Acetaminophen-Caffeine (GOODY HEADACHE PO) Take 1 packet by mouth daily as needed (headache).    [provider]  busPIRone (BUSPAR) 5 MG tablet TAKE 1 TABLET THREE TIMES DAILY Patient taking differently: Take 5 mg by mouth in the morning, at noon, and at bedtime. 03/19/21   Marin Olp, MD  Cholecalciferol (VITAMIN D3 PO) Take 1 capsule by mouth 2 (two) times daily.     [provider]  clopidogrel (PLAVIX) 75 MG tablet Take 1 tablet (75 mg total) by mouth daily for 21 days. 01/03/22 01/24/22  DanfordSuann Larry, MD  levothyroxine (SYNTHROID) 75 MCG tablet TAKE 1 TABLET EVERY DAY Patient taking differently: Take 75 mcg by mouth daily. 05/24/21   Marin Olp, MD  MAGNESIUM PO Take 1 tablet by mouth daily.    [provider]  Multiple Vitamins-Minerals (ZINC PO) Take 1 tablet by mouth daily.    [provider]  Pitavastatin Calcium 1 MG TABS Take 1 tablet (1 mg total) by mouth daily. 01/14/22   Marin Olp, MD  Polyethyl Glycol-Propyl Glycol (SYSTANE OP) Apply 1 drop to eye 2 (two) times daily as needed (dry eyes).    [provider]  valsartan (DIOVAN) 80 MG tablet Take 1 tablet (80 mg total) by mouth daily. 01/14/22   Marin Olp, MD  vitamin B-12 (CYANOCOBALAMIN) 1000 MCG tablet Take 1,000 mcg by mouth daily.    [provider]      Allergies    Shellfish allergy and Simvastatin    Review of Systems   Review of Systems  Constitutional:  Positive for fatigue. Negative for fever.  Respiratory:  Negative for cough and shortness of breath.   Cardiovascular:  Negative for chest pain and palpitations.  Gastrointestinal:  Positive for blood in stool and constipation. Negative for abdominal pain, nausea and  vomiting.  Genitourinary:  Negative for difficulty urinating and dysuria.  Neurological:  Positive for light-headedness. Negative for dizziness and headaches.   Physical Exam Updated Vital Signs BP (!) 163/78   Pulse 82   Temp 98 F (36.7 C) (Oral)   Resp 17   LMP  (LMP Unknown)   SpO2 99%  Physical Exam Vitals and nursing note reviewed. Exam conducted with a chaperone present.  Constitutional:      General: She is not in acute distress.    Appearance: She is not toxic-appearing or diaphoretic.     Comments: Elderly and chronically ill appearing.  HENT:     Head: Normocephalic and  atraumatic.     Right Ear: External ear normal.     Left Ear: External ear normal.     Nose: Nose normal.  Eyes:     General: No scleral icterus. Cardiovascular:     Rate and Rhythm: Normal rate and regular rhythm.     Heart sounds: Normal heart sounds. No murmur heard.   No friction rub. No gallop.  Pulmonary:     Effort: No respiratory distress.     Breath sounds: Normal breath sounds. No wheezing, rhonchi or rales.  Abdominal:     General: There is no distension.     Palpations: Abdomen is soft.     Tenderness: There is no abdominal tenderness. There is no guarding or rebound.  Genitourinary:    Comments: Rectal exam without bright red blood per rectum, normal tone, no visible hemorrhoids. No tenderness over the rectum. No rectal fissures. Musculoskeletal:        General: No deformity.     Cervical back: Neck supple.     Right lower leg: No edema.     Left lower leg: No edema.  Skin:    General: Skin is warm and dry.  Neurological:     General: No focal deficit present.     Mental Status: She is alert and oriented to person, place, and time.    ED Results / Procedures / Treatments   Labs (all labs ordered are listed, but only abnormal results are displayed) Labs Reviewed  CBC WITH DIFFERENTIAL/PLATELET - Abnormal; Notable for the following components:      Result Value   nRBC 0.3 (*)    All other components within normal limits  COMPREHENSIVE METABOLIC PANEL - Abnormal; Notable for the following components:   Sodium 133 (*)    GFR, Estimated 57 (*)    All other components within normal limits  URINALYSIS, ROUTINE W REFLEX MICROSCOPIC - Abnormal; Notable for the following components:   Color, Urine STRAW (*)    Hgb urine dipstick MODERATE (*)    Ketones, ur 5 (*)    All other components within normal limits  POC OCCULT BLOOD, ED    EKG EKG Interpretation  Date/Time:  Sunday Jan 20 2022 15:09:56 EDT Ventricular Rate:  78 PR Interval:  180 QRS Duration: 98 QT  Interval:  383 QTC Calculation: 437 R Axis:   55 Text Interpretation: Sinus rhythm Confirmed by Octaviano Glow [phone] on 01/20/2022 3:31:59 PM  Radiology No results found.  Procedures Procedures    Medications Ordered in ED Medications - No data to display  ED Course/ Medical Decision Making/ A&P Clinical Course as of 01/20/22 1839  Sun Jan 20, 2022  1545 81 yo female presenting with blood in bowel movement on Friday.  Hgb wnl here 13.8. [MT]  1609 Electrolytes unremarkable.  Will check UA.  If within normal limits, anticipate discharge home in the company of her son.  Advised PCP follow-up for her reported fatigue at home.  I have a low suspicion is time for stroke, acute anemia, dehydration, or sepsis. [MT]    Clinical Course User Index [MT] Trifan, Carola Rhine, MD                           Medical Decision Making Amount and/or Complexity of Data Reviewed Labs: ordered. ECG/medicine tests: ordered.   VISTA SAWATZKY is a 81 y.o. female with a history of hypothyroidism, GAD, HTN, GERD, and recent TIA presenting to the ED with blood in her stool.  On exam, the patient is afebrile and hypertensive with systolics in the 329J.  Her abdomen is soft and nontender to palpation in all quadrants.  Normal cardiac and pulmonary exam.  On rectal exam, she has no bright red blood per rectum.  Hemoccult testing obtained and is negative.  Differential diagnosis includes external hemorrhoids, internal hemorrhoids, diverticulosis, diverticulitis.  Given that she is not currently bleeding and only had the 1 episode in the setting of constipation, I think her symptoms are most likely from diverticulosis or mild internal hemorrhoids.  She does not have any active bleeding at this time.  I have recommended adding MiraLAX daily to help with her constipation symptoms.  UA is not concerning for infection.  CBC without leukocytosis and with a normal hemoglobin of 13.8.  Electrolytes are largely within  normal limits except for mild hyponatremia to 133.  Patient is not orthostatic.  She does not have a UTI based on work-up today.  No signs of acute infection.  Electrolytes are normal.  Unclear cause of her fatigue at this time, but she is able to ambulate without assistance in the ED is not currently complaining of any lightheadedness.  I do feel she is safe for discharge at this time.  I have recommended that she follow-up with her PCP within the next week and patient verbalizes understanding.  Strict return precautions were discussed and the patient was discharged home in stable condition.  Final Clinical Impression(s) / ED Diagnoses Final diagnoses:  Gastrointestinal hemorrhage, unspecified gastrointestinal hemorrhage type  Other fatigue    Rx / DC Orders ED Discharge Orders     None         Sondra Come, MD 01/20/22 1840    Wyvonnia Dusky, MD 01/20/22 [phone]

## 2022-01-20 NOTE — Discharge Instructions (Signed)
Your work-up was reassuring today including no signs of a urinary tract infection, normal electrolytes, and normal hemoglobin. Please follow-up with your primary care doctor within the next week if your symptoms persist.

## 2022-01-20 NOTE — Telephone Encounter (Signed)
If she has an appt to see me soon - I can figure out which direction to go with BP/Lipid control  [facility]

## 2022-01-20 NOTE — ED Triage Notes (Signed)
Pt from [facility] with reports of weakness and blood in her stool. Pt reports bright red blood in her stool on Friday. Denies blood in stool Saturday or Sunday. Pt reports she has been constipated and taking laxatives.

## 2022-01-21 NOTE — Telephone Encounter (Signed)
[name]- I mainly wanted her to see lipid clinic but I am fine if she sees Dr. Ellyn Hack since it appears that's required to get her into lipid clinic  [facility] team- for her blood pressure lets have her cut amlodipine in half so 2.5 mg of her 5 mg pill (since this can cause swelling) and increase valsartan to 2 tablets (so '160mg'$ ) and update me on Monday of next week with how blood pressure and swelling is doing. Do not need to increase her prescriptions yet until we see how she does

## 2022-01-22 ENCOUNTER — Telehealth (HOSPITAL_COMMUNITY): Payer: Self-pay | Admitting: Pharmacist

## 2022-01-22 NOTE — Telephone Encounter (Signed)
Unable to reach patient.  Three attempts made.

## 2022-01-22 NOTE — Telephone Encounter (Signed)
Lm for pt tcb. 

## 2022-01-24 MED ORDER — VALSARTAN 80 MG PO TABS: 80.0000 mg | ORAL_TABLET | Freq: Two times a day (BID) | ORAL | 0 refills | Status: DC

## 2022-01-24 NOTE — Telephone Encounter (Signed)
Called and spoke with pt and below message given. Pt requested script for Valsartan to be sent to [facility] as she does not have enough to double meds. Rx sent in, pt will update [place] on Monday.

## 2022-01-25 ENCOUNTER — Telehealth: Payer: Self-pay

## 2022-01-25 NOTE — Telephone Encounter (Signed)
Patient has called in stating that she seen Dr. Yong Channel on 5/15 and had shown him a tick bite.  States tick bite now has red bumps underneath it.    Would like to know if this is normal.    I have transferred patient over to triage for advice.

## 2022-01-25 NOTE — Telephone Encounter (Signed)
Patient Name: [name] CE Gender: Female DOB: 1941-03-04 Age: 81 Y 21 M 9 D Return Phone Number: [phone] (Primary), [phone] (Secondary) Address: City/ State/ Zip: [place] [address] Client [place] [facility] [place] [place] [facility] Provider Garret Reddish- MD Contact Type Call Who Is Calling Patient / Member / Family / Caregiver Call Type Triage / Clinical Relationship To Patient Self Return Phone Number [phone] (Primary) Chief Complaint Tick Bite Reason for Call Symptomatic / Request for [place] states had a tick bite on May 15th and now there are some red bumps and she is wondering if she should be seen. Translation No Nurse Assessment Nurse: Ysidro Evert, RN, Levada Dy Date/Time (Eastern Time): 01/25/2022 11:58:25 AM Confirm and document reason for call. If symptomatic, describe symptoms. ---Caller states she had a tick bite on her left knee on 5/15 and she noticed there are red bumps around the area. No fever Does the patient have any new or worsening symptoms? ---Yes Will a triage be completed? ---Yes Related visit to physician within the last 2 weeks? ---No Does the PT have any chronic conditions? (i.e. diabetes, asthma, this includes High risk factors for pregnancy, etc.) ---No Is this a behavioral health or substance abuse call? ---No Guidelines Guideline Title Affirmed Question Affirmed Notes Nurse Date/Time (Eastern Time) Tick Bite Unknown type of tick bite with no complications Earleen Reaper 01/25/2022 11:59:18 AM Disp. Time Eilene Ghazi Time) Disposition Final User 01/25/2022 12:02:28 [place], RN, Marin Shutter Disagree/Comply Comply Caller Understands Yes PreDisposition Did not know what to do Care Advice Given Per Guideline HOME CARE: * You should be able to treat this at home. CARE ADVICE given per Tick Bites (Adult)  guideline. * Fever or rash occur in the next 4 weeks CALL BACK IF: * Bite begins to look infected * You become worse

## 2022-02-05 ENCOUNTER — Encounter: Payer: Self-pay | Admitting: Adult Health

## 2022-02-05 ENCOUNTER — Ambulatory Visit: Payer: Medicare HMO | Admitting: Cardiology

## 2022-02-05 ENCOUNTER — Ambulatory Visit: Payer: Medicare HMO | Admitting: Adult Health

## 2022-02-05 VITALS — BP 165/87 | HR 70 | Ht 65.0 in | Wt 132.4 lb

## 2022-02-05 DIAGNOSIS — Z8673 Personal history of transient ischemic attack (TIA), and cerebral infarction without residual deficits: Secondary | ICD-10-CM

## 2022-02-05 DIAGNOSIS — I10 Essential (primary) hypertension: Secondary | ICD-10-CM

## 2022-02-05 DIAGNOSIS — E7800 Pure hypercholesterolemia, unspecified: Secondary | ICD-10-CM

## 2022-02-05 NOTE — Progress Notes (Signed)
PATIENT: [name] DOB: Apr 23, 1941  REASON FOR VISIT: follow up HISTORY FROM: patient PRIMARY NEUROLOGIST: Dr. Leonie Man  Chief Complaint  Patient presents with   Follow-up    Pt in 68 with husband   Pt  is here for TIA follow up Pt states she feels good. Pt states she has no questions or concerns for this visit      HISTORY OF PRESENT ILLNESS:   HISTORY Ms. [name] is a 81 y.o. female with history of anxiety, arthritis and HLD presenting with sudden onset left sided weakness and numbness which has since resolved.  MRI was negative for stroke.   TIA vs. complicated migraine Episode of whole body weakness left more than right, feeling nauseous, visual disturbance with wavy lines, with whole head headache.  Resolved within 1 hour. Code Stroke CT head No acute abnormality. Small vessel disease. Atrophy. ASPECTS 10.    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 Therapy recommendations:  outpatient PT Disposition:  likely home  Today:  Fortunately the patient's symptoms resolved and she has not had any strokelike symptoms since. Patient has already seen her primary care and they have been adjusting her blood pressure medication and cholesterol medication.  Looks like she has been referred to the lipid clinic.  As she has had trouble tolerating statin medication.  Currently taking ASA only. Returns today for FU    REVIEW OF SYSTEMS: Out of a complete 14 system review of symptoms, the patient complains only of the following symptoms, and all other reviewed systems are  negative.  ALLERGIES: Allergies  Allergen Reactions   Shellfish Allergy Nausea And Vomiting and Other (See Comments)    Severe stomach pains, fever, sweating   Simvastatin Other (See Comments)    Myalgia    HOME MEDICATIONS: Outpatient Medications Prior to Visit  Medication Sig Dispense Refill   ALPRAZolam (XANAX) 0.25 MG tablet TAKE 1 TO 2 TABLETS TWICE DAILY AS NEEDED FOR ANXIETY (Patient taking differently: Take 0.25 mg by mouth 2 (two) times daily as needed for anxiety.) 120 tablet 0   amLODipine (NORVASC) 5 MG tablet Take 1 tablet (5 mg total) by mouth daily. 90 tablet 3   aspirin 81 MG EC tablet Take 1 tablet (81 mg total) by mouth daily. Swallow whole. 30 tablet 11   Aspirin-Acetaminophen-Caffeine (GOODY HEADACHE PO) Take 1 packet by mouth daily as needed (headache).     busPIRone (BUSPAR) 5 MG tablet TAKE 1 TABLET THREE TIMES DAILY (Patient taking differently: Take 5 mg by mouth in the morning, at noon, and at bedtime.) 270 tablet 3   Cholecalciferol (VITAMIN D3 PO) Take 1 capsule by mouth 2 (two) times daily.     levothyroxine (SYNTHROID) 75 MCG tablet TAKE 1 TABLET EVERY DAY (Patient taking differently: Take 75 mcg by mouth daily.) 90 tablet 3   MAGNESIUM PO Take 1 tablet by mouth daily.     Multiple Vitamins-Minerals (ZINC PO) Take 1 tablet by mouth daily.     Pitavastatin Calcium 1 MG TABS Take 1 tablet (1 mg total) by mouth daily. 90 tablet  3   Polyethyl Glycol-Propyl Glycol (SYSTANE OP) Apply 1 drop to eye 2 (two) times daily as needed (dry eyes).     valsartan (DIOVAN) 80 MG tablet Take 1 tablet (80 mg total) by mouth 2 (two) times daily. 60 tablet 0   vitamin B-12 (CYANOCOBALAMIN) 1000 MCG tablet Take 1,000 mcg by mouth daily.     No facility-administered medications prior to visit.    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, bilateral    SVD (spontaneous vaginal delivery)    x 4   Thyroid disease     PAST SURGICAL HISTORY: Past Surgical History:  Procedure Laterality Date   ABDOMINAL HYSTERECTOMY     APPENDECTOMY     BREAST SURGERY Right    LUMPECTOMY- FIBROID TUMOR benign   COLONOSCOPY     hx polyps/ [facility]-mother    ENTEROCELE REPAIR     EXCISIONAL HEMORRHOIDECTOMY     fnger surgery  04/2018   POLYPECTOMY      FAMILY HISTORY: Family History  Problem Relation Age of Onset   Colon cancer Mother 60   Anxiety disorder Mother    Deep vein thrombosis Mother        died age 81   Heart disease Father 77       smoker, high stress job   Colon cancer Brother 59   Colon cancer Maternal Aunt 32   Colon cancer Son 40   Colon cancer Paternal Grandmother    Colon cancer Other 32   Hodgkin's lymphoma Brother 49   Breast cancer Daughter    Colon polyps Neg Hx    Esophageal cancer Neg Hx    Rectal cancer Neg Hx    Stomach cancer Neg Hx     SOCIAL HISTORY: Social History   Socioeconomic History   Marital status: Married    Spouse name: Not on file   Number of children: 4   Years of education: Not on file   Highest education level: Not on file  Occupational History   Not on file  Tobacco Use   Smoking status: Former    Packs/day: 1.00    Years: 13.00    Pack years: 13.00    Types: Cigarettes    Start date: 09/02/1965    Quit date: 09/02/1978    Years since quitting: 43.4   Smokeless tobacco: Never   Tobacco comments:    quit in 1980  Vaping Use   Vaping Use: Never used  Substance and Sexual Activity   Alcohol use: No    Alcohol/week: 0.0 standard drinks   Drug use: No   Sexual activity: Not on file  Other Topics Concern   Not on file  Social History Narrative   Married 61 years in 2019. 4 children, 3 living. 5 grandkids.    Poodle 12.5 in 2019.       Housewife.    Some college- [employer]      Hobbies: travel, walking, work out in yard   Social Determinants of Adult nurse Strain: Not on file  Food Insecurity: Not on file  Transportation Needs: Not on file  Physical Activity: Not on file  Stress: Not on file  Social Connections: Not on file  Intimate Partner Violence: Not on file  PHYSICAL EXAM  Vitals:   02/05/22 1349  BP: (!) 165/87  Pulse: 70  Weight: 132 lb 6.4 oz (60.1 kg)  Height: '5\' 5"'$  (1.651 m)   Body mass index is 22.03 kg/m.  Generalized: Well developed, in no acute distress   Neurological examination  Mentation: Alert oriented to time, place, history taking. Follows all commands speech and language fluent Cranial nerve II-XII: Pupils were equal round reactive to light. Extraocular movements were full, visual field were full on confrontational test. Facial sensation and strength were normal. Head turning and shoulder shrug  were normal and symmetric. Motor: The motor testing reveals 5 over 5 strength of all 4 extremities. Good symmetric motor tone is noted throughout.  Sensory: Sensory testing is intact to soft touch on all 4 extremities. No evidence of extinction is noted.  Coordination: Cerebellar testing reveals good finger-nose-finger and heel-to-shin bilaterally.  Gait and station: Gait is normal. Tandem gait is normal. Romberg is negative. No drift is seen.  Reflexes: Deep tendon reflexes are symmetric and normal bilaterally.   DIAGNOSTIC DATA (LABS, IMAGING, TESTING) - I reviewed patient records, labs, notes, testing and imaging myself where available.  Lab Results  Component Value Date   WBC 7.6 01/20/2022   HGB 13.8 01/20/2022   HCT 43.0 01/20/2022   MCV 96.0 01/20/2022   PLT 208 01/20/2022      Component Value Date/Time   NA 133 (L) 01/20/2022 1502   K 4.3 01/20/2022 1502   CL 101 01/20/2022 1502   CO2 24 01/20/2022 1502   GLUCOSE 98 01/20/2022 1502   BUN 13 01/20/2022 1502   CREATININE 0.99 01/20/2022 1502   CREATININE 0.92 05/14/2019 1512   CALCIUM 9.2 01/20/2022 1502   PROT 6.6  01/20/2022 1502   ALBUMIN 3.9 01/20/2022 1502   AST 15 01/20/2022 1502   ALT 14 01/20/2022 1502   ALKPHOS 67 01/20/2022 1502   BILITOT 0.7 01/20/2022 1502   GFRNONAA 57 (L) 01/20/2022 1502   GFRAA 70 (L) 08/20/2014 1345   Lab Results  Component Value Date   CHOL 222 (H) 01/02/2022   HDL 56 01/02/2022   LDLCALC 149 (H) 01/02/2022   LDLDIRECT 143.0 02/23/2018   TRIG 84 01/02/2022   CHOLHDL 4.0 01/02/2022   Lab Results  Component Value Date   HGBA1C 5.2 01/01/2022   No results found for: VITAMINB12 Lab Results  Component Value Date   TSH 0.654 01/01/2022      ASSESSMENT AND PLAN 81 y.o. 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 here with:   TIA vs. Complicated migraine:  No Residual deficits. Continue aspirin 81 mg daily  for secondary stroke prevention.  Discussed secondary stroke prevention measures and importance of close PCP follow up for aggressive stroke risk factor management. I have gone over the pathophysiology of stroke, warning signs and symptoms, risk factors and their management in some detail with instructions to go to the closest emergency room for symptoms of concern. HTN: BP goal <130/90.  PCP is making adjustments. Today 165/87. Currently on Amlodipine  '5mg'$  daily and Valsartan 80 mg BID. HLD: LDL goal <70. Recent LDL 149.  Has been referred to the lipid clinic by PCP DMII: A1c goal<7.0. Recent A1c 5.2.  Encouraged patient to monitor diet and encouraged exercise FU with our office PRN      Ward Givens, MSN, NP-C 02/05/2022, 1:25 PM [facility]  [address], [address], [place] [address] [phone]

## 2022-02-05 NOTE — Patient Instructions (Addendum)
Continue aspirin 81 mg daily  for secondary stroke prevention.  Discussed secondary stroke prevention measures and importance of close PCP follow up for aggressive stroke risk factor management. I have gone over the pathophysiology of stroke, warning signs and symptoms, risk factors and their management in some detail with instructions to go to the closest emergency room for symptoms of concern. HTN: BP goal <130/90.   HLD: LDL goal <70. Recent LDL 149.   DMII: A1c goal<7.0. Recent A1c 5.2.    monitor diet and encouraged exercise

## 2022-02-14 ENCOUNTER — Ambulatory Visit (INDEPENDENT_AMBULATORY_CARE_PROVIDER_SITE_OTHER): Payer: Medicare HMO | Admitting: Family Medicine

## 2022-02-14 ENCOUNTER — Encounter: Payer: Self-pay | Admitting: Family Medicine

## 2022-02-14 VITALS — BP 136/68 | HR 71 | Temp 98.0°F | Ht 65.0 in | Wt 131.2 lb

## 2022-02-14 DIAGNOSIS — I10 Essential (primary) hypertension: Secondary | ICD-10-CM

## 2022-02-14 DIAGNOSIS — E039 Hypothyroidism, unspecified: Secondary | ICD-10-CM | POA: Diagnosis not present

## 2022-02-14 DIAGNOSIS — E7800 Pure hypercholesterolemia, unspecified: Secondary | ICD-10-CM | POA: Diagnosis not present

## 2022-02-14 MED ORDER — ROSUVASTATIN CALCIUM 10 MG PO TABS
10.0000 mg | ORAL_TABLET | ORAL | 3 refills | Status: DC
Start: 2022-02-14 — End: 2022-04-12

## 2022-02-14 NOTE — Progress Notes (Signed)
Phone [phone] In person visit   Subjective:   [name] is a 81 y.o. year old very pleasant female patient who presents for/with See problem oriented charting Chief Complaint  Patient presents with   Follow-up   Hypertension   Hypothyroidism   dizzy spells    Pt c/o dizzy spells when she stands up but when she puts her head down it goes away and this does not last long. She denies chest pain and SOB.    Past Medical History-  Patient Active Problem List   Diagnosis Date Noted   History of TIA (transient ischemic attack) 01/01/2022    Priority: High   GERD (gastroesophageal reflux disease) 02/07/2020    Priority: Medium    GAD (generalized anxiety disorder) 06/01/2018    Priority: Medium    Essential hypertension 06/01/2018    Priority: Medium    Hypercholesterolemia 12/10/2011    Priority: Medium    Hypothyroidism 12/10/2011    Priority: Medium    Other and unspecified ovarian cyst 10/25/2013    Priority: Low   Vaginal atrophy 06/03/2013    Priority: Low   Hx of colonic polyps 12/10/2011    Priority: Low   Osteopenia 10/03/2020   Atypical chest pain 01/06/2020    Medications- reviewed and updated Current Outpatient Medications  Medication Sig Dispense Refill   ALPRAZolam (XANAX) 0.25 MG tablet TAKE 1 TO 2 TABLETS TWICE DAILY AS NEEDED FOR ANXIETY (Patient taking differently: Take 0.25 mg by mouth 2 (two) times daily as needed for anxiety.) 120 tablet 0   amLODipine (NORVASC) 5 MG tablet Take 1 tablet (5 mg total) by mouth daily. 90 tablet 3   aspirin 81 MG EC tablet Take 1 tablet (81 mg total) by mouth daily. Swallow whole. 30 tablet 11   Aspirin-Acetaminophen-Caffeine (GOODY HEADACHE PO) Take 1 packet by mouth daily as needed (headache).     busPIRone (BUSPAR) 5 MG tablet TAKE 1 TABLET THREE TIMES DAILY (Patient taking differently: Take 5 mg by mouth in the morning, at noon, and at bedtime.) 270 tablet 3   Cholecalciferol (VITAMIN D3 PO) Take 1 capsule by  mouth 2 (two) times daily.     levothyroxine (SYNTHROID) 75 MCG tablet TAKE 1 TABLET EVERY DAY (Patient taking differently: Take 75 mcg by mouth daily.) 90 tablet 3   MAGNESIUM PO Take 1 tablet by mouth daily.     Multiple Vitamins-Minerals (ZINC PO) Take 1 tablet by mouth daily.     Polyethyl Glycol-Propyl Glycol (SYSTANE OP) Apply 1 drop to eye 2 (two) times daily as needed (dry eyes).     rosuvastatin (CRESTOR) 10 MG tablet Take 1 tablet (10 mg total) by mouth once a week. 13 tablet 3   valsartan (DIOVAN) 80 MG tablet Take 1 tablet (80 mg total) by mouth 2 (two) times daily. 60 tablet 0   vitamin B-12 (CYANOCOBALAMIN) 1000 MCG tablet Take 1,000 mcg by mouth daily.     No current facility-administered medications for this visit.     Objective:  BP 136/68   Pulse 71   Temp 98 F (36.7 C)   Ht '5\' 5"'$  (1.651 m)   Wt 131 lb 3.2 oz (59.5 kg)   LMP  (LMP Unknown)   SpO2 98%   BMI 21.83 kg/m  Gen: NAD, resting comfortably CV: RRR no murmurs rubs or gallops Lungs: CTAB no crackles, wheeze, rhonchi Abdomen: soft/nontender/nondistended/normal bowel sounds.  Ext: no edema Skin: warm, dry Neuro: grossly normal, moves all extremities, no lightheadedness  with standing today    Assessment and Plan    #History of TIA-now on long-term aspirin-she finished dual antiplatelet 3-week course (saw neurology on June 5) #hyperlipidemia S: Medication: Statin intolerant in the past- depressed mood on simvastatin 10 mg.   - livalo was going to be 1000 unfortunately Lab Results  Component Value Date   CHOL 222 (H) 01/02/2022   HDL 56 01/02/2022   LDLCALC 149 (H) 01/02/2022   LDLDIRECT 143.0 02/23/2018   TRIG 84 01/02/2022   CHOLHDL 4.0 01/02/2022   A/P: With history of TIA we need to control cholesterol better.  She has had depressed mood on simvastatin so we obviously will not trial that.  Livalo was going to be $1000 so that is not an option.  Will trial rosuvastatin 10 mg just once a week-we  want to be very cautious due to history of depression on simvastatin-she will let me know if any symptoms develop  #hypertension-new diagnosis 12/18/2020 with history of whitecoat hypertension S: medication: amlodipine 5 mg, valsartan 80 mg twice daily. Is getting some lightheadedness with cooking in the kitchen for prolonged periods- if leans over for a minute or two starts to feel better (did not feel this way until after we went up to full dose on BP medicines). She did not mention to neurology. Happens several times a week once or twice a day. No neck pain. No left shoulder or arm pain. No sypmtoms with brisk walking which she is trying to do Home readings #s:  over last 5 home readings  avg 136.6 75.4    BP Readings from Last 3 Encounters:  02/14/22 136/68  02/05/22 (!) 165/87  01/20/22 (!) 163/78   A/P: Blood pressure is reasonably well controlled.  He is getting some lightheadedness so I do not want to increase dose further although neurology would like for systolic readings to be under 130-with her side effects with increasing medicine I do not think we can increase blood pressure at this time.  If she has worsening symptoms I want her to let us know as in fact we may need to reduce medicine - I gave her option if blood pressure over 155 of taking amlodipine twice a day- I would refill early if needed -she is also going to verify that she is on 5 mg at home- she thinks may still be on 2.5 mg of amlodipine    #hypothyroidism S: compliant On thyroid medication-Levothyroxine 52mg Lab Results  Component Value Date   TSH 0.654 01/01/2022  A/P:  Controlled. Continue current medications.    #mammogram- will get with Dr. HMatthew Sarasbefore next visit  Recommended follow up: Return for next already scheduled visit or sooner if needed. Future Appointments  Date Time Provider [place] 06/07/2022  1:00 PM HMarin Olp MD [facility]    Lab/Order associations:   ICD-10-CM   1.  Essential hypertension  I10     2. Hypercholesterolemia  E78.00     3. Acquired hypothyroidism  E03.9       Meds ordered this encounter  Medications   rosuvastatin (CRESTOR) 10 MG tablet    Sig: Take 1 tablet (10 mg total) by mouth once a week.    Dispense:  13 tablet    Refill:  3    Return precautions advised.  SGarret Reddish MD

## 2022-02-14 NOTE — Patient Instructions (Addendum)
Will trial rosuvastatin 10 mg just once a week-we want to be very cautious due to history of depression on simvastatin-she will let me know if any symptoms develop  If dizziness worsens or other new symptoms let us know asap  Blood pressure goal <135/85 on average at home- you are very close to this but hesitate to increase further due to side effects  Recommended follow up: Return for next already scheduled visit or sooner if needed.

## 2022-02-17 ENCOUNTER — Other Ambulatory Visit: Payer: Self-pay | Admitting: Family Medicine

## 2022-02-25 DIAGNOSIS — Z01419 Encounter for gynecological examination (general) (routine) without abnormal findings: Secondary | ICD-10-CM | POA: Diagnosis not present

## 2022-02-25 DIAGNOSIS — Z1231 Encounter for screening mammogram for malignant neoplasm of breast: Secondary | ICD-10-CM | POA: Diagnosis not present

## 2022-02-25 DIAGNOSIS — Z6822 Body mass index (BMI) 22.0-22.9, adult: Secondary | ICD-10-CM | POA: Diagnosis not present

## 2022-03-12 ENCOUNTER — Telehealth: Payer: Self-pay | Admitting: Family Medicine

## 2022-03-12 NOTE — Telephone Encounter (Signed)
Pt has had both the Penumovax 23 and Prevnar, does she need the 20?

## 2022-03-12 NOTE — Telephone Encounter (Signed)
Patient states [facility] told Patient she needs a Pneumonia vaccine. Patient requests to be called at ph# [phone] to be advised if Patient really needs the vaccine.

## 2022-03-12 NOTE — Telephone Encounter (Signed)
She is up-to-date on her pneumonia vaccines.  Technically she can get Prevnar 20 if she would like since it has been over 5 years since her last pneumonia shot but since she has had Prevnar 13 and Pneumovax 23 this is not required-it is more of an individual decision

## 2022-03-13 NOTE — Telephone Encounter (Signed)
Called and lm on pt vm tcb, when pt returns call please give below message.

## 2022-03-14 NOTE — Telephone Encounter (Signed)
FYI Patient called in regards to VM left by [facility]. I informed patient of message below and she verbalized understanding. She will wait until her visit in August to pursue additional Prevnar 20 vaccination.

## 2022-03-15 ENCOUNTER — Encounter: Payer: Medicare HMO | Admitting: Family Medicine

## 2022-03-21 ENCOUNTER — Other Ambulatory Visit: Payer: Self-pay | Admitting: Family Medicine

## 2022-03-21 MED ORDER — ALPRAZOLAM 0.25 MG PO TABS: 0.2500 mg | ORAL_TABLET | Freq: Two times a day (BID) | ORAL | 0 refills | Status: DC | PRN

## 2022-03-21 NOTE — Telephone Encounter (Signed)
..   Encourage patient to contact the pharmacy for refills or they can request refills through [facility]:  02/14/22  NEXT APPOINTMENT DATE: 06/07/22  MEDICATION: ALPRAZolam (XANAX) 0.25 MG tablet  Is the patient out of medication? No, has a week- two weeks worth  PHARMACY: [facility], [address]  [place], [place] [address]  Phone:  [phone]  Fax:  [phone]  DEA #:  --  Let patient know to contact pharmacy at the end of the day to make sure medication is ready.  Please notify patient to allow 48-72 hours to process

## 2022-04-02 ENCOUNTER — Telehealth: Payer: Self-pay | Admitting: Family Medicine

## 2022-04-02 NOTE — Telephone Encounter (Signed)
Patient states she was informed by [facility] that she will need another prescription written for her Xanax. Upon asking patient if they were asking for a PA, she stated she was unsure. Please Advise.

## 2022-04-03 ENCOUNTER — Ambulatory Visit (INDEPENDENT_AMBULATORY_CARE_PROVIDER_SITE_OTHER): Payer: Medicare HMO | Admitting: Internal Medicine

## 2022-04-03 ENCOUNTER — Encounter: Payer: Self-pay | Admitting: Internal Medicine

## 2022-04-03 VITALS — BP 128/72 | HR 73 | Temp 98.0°F | Ht 65.0 in | Wt 131.0 lb

## 2022-04-03 DIAGNOSIS — M791 Myalgia, unspecified site: Secondary | ICD-10-CM

## 2022-04-03 DIAGNOSIS — R55 Syncope and collapse: Secondary | ICD-10-CM | POA: Diagnosis not present

## 2022-04-03 DIAGNOSIS — R42 Dizziness and giddiness: Secondary | ICD-10-CM

## 2022-04-03 DIAGNOSIS — R0789 Other chest pain: Secondary | ICD-10-CM

## 2022-04-03 NOTE — Telephone Encounter (Signed)
FYI--Patient called back to clarify that when she said she wanted the medication sent to [facility], she meant [facility] she was able to go to [place] to pick it up this time but wanted to make sure the medication can be sent to [facility] from now on.

## 2022-04-03 NOTE — Progress Notes (Signed)
[name] is a 81 y.o. female who presents today for an office visit.  Assessment/Plan:   Chronic progressive complicated presyncopal malaise, cause uncertain/unknown, potential serious underlying illness.   This is a very pleasant 81 year old lady who has difficult to diagnose malaise with presyncope which seems to not be related to her blood pressure or dehydration based on my exam today with orthostatics, therefore I am inclined to agree with her concern that the symptoms are secondary to her rosuvastatin.  Therefore I agreed with the plan to see if symptoms would resolve by stopping rosuvastatin and do a blood work-up to look for other possible causes as she does have multiple potential comorbidities.  Review of systems discovered pain going down her arm that was concerning for a cardiac etiology and I diagnosed as atypical chest pain even though it was not really in the chest.  She declined EKG offer as she does not have any discomfort at present   Problem List Items Addressed This Visit       Active Problems   Atypical chest pain   Relevant Orders   Troponin I   Other Visit Diagnoses     Postural dizziness with presyncope    -  Primary   Relevant Orders   CBC   Comp Met (CMET)   B12 and Folate Panel   TSH   T4, free   Muscle pain       Relevant Orders   CK (Creatine Kinase)       First, try stopping rosuvastatin for 1-2 weeks, see if that helps. If so call and let Dr. Yong Channel know.  If problem persists, resume rosuvastatin, and then try stopping either amlodipine or valsartan, but not both. If you do this, you will need to monitor your blood pressure afterwards.       Subjective:  HPI:  Pt here for feeling light headed and as if she is going to pass out for almost two months. Feels awful sometimes, new headaches and has to lay her head down often to prevent passing out.  Headaches are not necessarily occurring at times of lightheadedness.   So no headache right  now, but the sense of dizziness is occurring.  Denies any falls. Almost fell a couple times.  She mentioned this issue to Dr. Yong Channel at last visit, but the problem has gotten a lot worse sense then, and presyncopal sensation much more frequent.  She feels the blood pressure meds didn't affect her, but that the symptoms might be a/w her  statin med- she took cholesterol me don Monday and noticed it got worse after.  Has h/o poor statin tolerance.    She notes that last night she had some pain going down her left arm, didn't seem to be in her chest. Its come and gone a few times but not today- prior ekg w/u was negative.       Objective:  Physical Exam: BP 128/72   Pulse 73   Temp 98 F (36.7 C) (Temporal)   Ht $R'5\' 5"'sh$  (1.651 m)   Wt 131 lb (59.4 kg)   LMP  (LMP Unknown)   SpO2 92%   BMI 21.80 kg/m   Gen: No acute distress, resting comfortably Psych: Normal affect and thought content  Problem specific physical exam findings:   Orthostatics performed by physician with nurse present systolic 409->811 and hr 91Y->78G going from lying flat to standing up- no lightheadness provoked by this maneuver and she was steady on  her foot. Oral mucosa was moist and pink        Vicki Pacas, MD 04/03/2022 4:11 PM

## 2022-04-03 NOTE — Patient Instructions (Addendum)
  First, try stopping rosuvastatin for 1-2 weeks, see if that helps. If so call and let Dr. Yong Channel know.  If problem persists, resume rosuvastatin, and then try stopping either amlodipine or valsartan, but not both. If you do this, you will need to monitor your bloodpressure afterwards.    Follow up with Dr. Yong Channel in 2 weeks about the symptoms and possibly replacing any medication that you decide to stop

## 2022-04-03 NOTE — Telephone Encounter (Signed)
Noted  

## 2022-04-04 LAB — COMPREHENSIVE METABOLIC PANEL
ALT: 12 U/L (ref 0–35)
AST: 19 U/L (ref 0–37)
Albumin: 4.2 g/dL (ref 3.5–5.2)
Alkaline Phosphatase: 74 U/L (ref 39–117)
BUN: 19 mg/dL (ref 6–23)
CO2: 27 mEq/L (ref 19–32)
Calcium: 9.3 mg/dL (ref 8.4–10.5)
Chloride: 99 mEq/L (ref 96–112)
Creatinine, Ser: 1.07 mg/dL (ref 0.40–1.20)
GFR: 48.36 mL/min — ABNORMAL LOW (ref >60.00)
Glucose, Bld: 95 mg/dL (ref 70–99)
Potassium: 4.8 mEq/L (ref 3.5–5.1)
Sodium: 134 mEq/L — ABNORMAL LOW (ref 135–145)
Total Bilirubin: 0.4 mg/dL (ref 0.2–1.2)
Total Protein: 6.8 g/dL (ref 6.0–8.3)

## 2022-04-04 LAB — CBC
HCT: 38.6 % (ref 36.0–46.0)
Hemoglobin: 13.1 g/dL (ref 12.0–15.0)
MCHC: 34.1 g/dL (ref 30.0–36.0)
MCV: 90.4 fl (ref 78.0–100.0)
Platelets: 215 10*3/uL (ref 150.0–400.0)
RBC: 4.27 Mil/uL (ref 3.87–5.11)
RDW: 13.1 % (ref 11.5–15.5)
WBC: 6.6 10*3/uL (ref 4.0–10.5)

## 2022-04-04 LAB — B12 AND FOLATE PANEL
Folate: 11 ng/mL (ref >5.9)
Vitamin B-12: >1500 pg/mL — ABNORMAL HIGH (ref 211–911)

## 2022-04-04 LAB — TSH: TSH: 0.25 u[IU]/mL — ABNORMAL LOW (ref 0.35–5.50)

## 2022-04-04 LAB — TROPONIN I: Troponin I: 3 ng/L (ref <=47)

## 2022-04-04 LAB — CK: Total CK: 61 U/L (ref 7–177)

## 2022-04-04 LAB — T4, FREE: Free T4: 1.17 ng/dL (ref 0.60–1.60)

## 2022-04-04 NOTE — Progress Notes (Signed)
Call patient- she says she wont use mychart.   The thyroid function is borderline but I think ok.  Recommend stay on 75 mcg daily. Blood counts and B12 are fine so those are definitely not causing her symptoms let her know. Sodium is a little low but it has been for 2 years so I dont think its related to recent symptoms.   We have a very sensitive test for heart injury that's negative so symptoms are less likely to be heart related... but I still cant totally rule that out.    Labs do suggest slight dehydration and with the low sodium, I do recommend drink salty fluids like gatorade and chicken noodle soup and see if that helps symptoms.   Overall my best guess is still that it is a rare side effect of the cholesterol medicine- there were no serious concerns identified from the labwork.   Nonetheless- if the problem is worsening despite holding the medicine... she should go visit cardiology to get heart checked.

## 2022-04-09 ENCOUNTER — Telehealth: Payer: Self-pay | Admitting: Cardiology

## 2022-04-09 NOTE — Telephone Encounter (Signed)
Pt c/o Syncope: STAT if syncope occurred within 30 minutes and pt complains of lightheadedness High Priority if episode of passing out, completely, today or in last 24 hours   Did you pass out today?  No   When is the last time you passed out?  Yesterday, 8/07 afternoon  Has this occurred multiple times?  Yes, occurs multiple times daily and only last for a few seconds  Did you have any symptoms prior to passing out?  No

## 2022-04-09 NOTE — Telephone Encounter (Signed)
Spoke to patient.   She states she is having  fainting like spells daily . They do don't last but a few seconds. She states she  went to primary office and saw Dr Minna Merritts. He recommend she see cardiology . Dr Minna Merritts did to [facility].  Offered patient next available appointment. She states she was available Friday  04/12/22 . Appointment schedule for 2:45 pm  Patient verbalized understanding  RN recommend patient not to drive.patient verbalized understanding.

## 2022-04-10 DIAGNOSIS — L821 Other seborrheic keratosis: Secondary | ICD-10-CM | POA: Diagnosis not present

## 2022-04-10 DIAGNOSIS — L853 Xerosis cutis: Secondary | ICD-10-CM | POA: Diagnosis not present
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
## 2022-04-11 ENCOUNTER — Other Ambulatory Visit: Payer: Self-pay | Admitting: Family Medicine

## 2022-04-12 ENCOUNTER — Encounter: Payer: Self-pay | Admitting: Adult Health

## 2022-04-12 ENCOUNTER — Ambulatory Visit: Payer: Medicare HMO | Admitting: Adult Health

## 2022-04-12 VITALS — BP 150/64 | HR 73 | Ht 65.0 in | Wt 130.4 lb

## 2022-04-12 DIAGNOSIS — E7800 Pure hypercholesterolemia, unspecified: Secondary | ICD-10-CM

## 2022-04-12 DIAGNOSIS — F411 Generalized anxiety disorder: Secondary | ICD-10-CM

## 2022-04-12 DIAGNOSIS — Z79899 Other long term (current) drug therapy: Secondary | ICD-10-CM | POA: Diagnosis not present

## 2022-04-12 DIAGNOSIS — I10 Essential (primary) hypertension: Secondary | ICD-10-CM

## 2022-04-12 MED ORDER — EZETIMIBE 10 MG PO TABS: 10.0000 mg | ORAL_TABLET | Freq: Every day | ORAL | 1 refills | Status: DC

## 2022-04-12 NOTE — Patient Instructions (Signed)
Medication Instructions:  Stop Crestor. Start Zetia 10 mg ( Take 1 Tablet Daily). *If you need a refill on your cardiac medications before your next appointment, please call your pharmacy*   Lab Work: No Labs If you have labs (blood work) drawn today and your tests are completely normal, you will receive your results only by: [place] (if you have MyChart) OR A paper copy in the mail If you have any lab test that is abnormal or we need to change your treatment, we will call you to review the results.   Testing/Procedures: No Testing   Follow-Up: At [facility], you and your health needs are our priority.  As part of our continuing mission to provide you with exceptional heart care, we have created designated Provider Care Teams.  These Care Teams include your primary Cardiologist (physician) and Advanced Practice Providers (APPs -  Physician Assistants and Nurse Practitioners) who all work together to provide you with the care you need, when you need it.  We recommend signing up for the patient portal called "MyChart".  Sign up information is provided on this After Visit Summary.  MyChart is used to connect with patients for Virtual Visits (Telemedicine).  Patients are able to view lab/test results, encounter notes, upcoming appointments, etc.  Non-urgent messages can be sent to your provider as well.   To learn more about what you can do with MyChart, go to [email or web address].    Your next appointment:   6 month(s)  The format for your next appointment:   In Person  Provider:   Glenetta Hew, MD

## 2022-04-16 ENCOUNTER — Ambulatory Visit: Payer: Medicare HMO | Admitting: Family Medicine

## 2022-05-04 ENCOUNTER — Other Ambulatory Visit: Payer: Self-pay | Admitting: Family Medicine

## 2022-05-07 ENCOUNTER — Other Ambulatory Visit: Payer: Self-pay | Admitting: Family Medicine

## 2022-05-21 ENCOUNTER — Other Ambulatory Visit: Payer: Self-pay

## 2022-05-21 MED ORDER — EZETIMIBE 10 MG PO TABS: 10.0000 mg | ORAL_TABLET | Freq: Every day | ORAL | 1 refills | Status: DC

## 2022-06-05 ENCOUNTER — Other Ambulatory Visit: Payer: Self-pay | Admitting: Family Medicine

## 2022-06-07 ENCOUNTER — Ambulatory Visit (INDEPENDENT_AMBULATORY_CARE_PROVIDER_SITE_OTHER): Payer: Medicare HMO | Admitting: Family Medicine

## 2022-06-07 ENCOUNTER — Encounter: Payer: Self-pay | Admitting: Family Medicine

## 2022-06-07 VITALS — BP 138/64 | HR 75 | Temp 98.0°F | Ht 65.0 in | Wt 128.8 lb

## 2022-06-07 DIAGNOSIS — Z79899 Other long term (current) drug therapy: Secondary | ICD-10-CM

## 2022-06-07 DIAGNOSIS — E039 Hypothyroidism, unspecified: Secondary | ICD-10-CM | POA: Diagnosis not present

## 2022-06-07 DIAGNOSIS — Z23 Encounter for immunization: Secondary | ICD-10-CM | POA: Diagnosis not present

## 2022-06-07 DIAGNOSIS — E7800 Pure hypercholesterolemia, unspecified: Secondary | ICD-10-CM

## 2022-06-07 DIAGNOSIS — Z0000 Encounter for general adult medical examination without abnormal findings: Secondary | ICD-10-CM

## 2022-06-07 LAB — COMPREHENSIVE METABOLIC PANEL
ALT: 13 U/L (ref 0–35)
AST: 21 U/L (ref 0–37)
Albumin: 4.1 g/dL (ref 3.5–5.2)
Alkaline Phosphatase: 69 U/L (ref 39–117)
BUN: 22 mg/dL (ref 6–23)
CO2: 27 mEq/L (ref 19–32)
Calcium: 9.2 mg/dL (ref 8.4–10.5)
Chloride: 99 mEq/L (ref 96–112)
Creatinine, Ser: 0.94 mg/dL (ref 0.40–1.20)
GFR: 56.43 mL/min — ABNORMAL LOW (ref >60.00)
Glucose, Bld: 91 mg/dL (ref 70–99)
Potassium: 4.1 mEq/L (ref 3.5–5.1)
Sodium: 134 mEq/L — ABNORMAL LOW (ref 135–145)
Total Bilirubin: 0.5 mg/dL (ref 0.2–1.2)
Total Protein: 6.8 g/dL (ref 6.0–8.3)

## 2022-06-07 LAB — VITAMIN B12: Vitamin B-12: >1500 pg/mL — ABNORMAL HIGH (ref 211–911)

## 2022-06-07 LAB — TSH: TSH: 0.16 u[IU]/mL — ABNORMAL LOW (ref 0.35–5.50)

## 2022-06-07 LAB — LDL CHOLESTEROL, DIRECT: Direct LDL: 100 mg/dL

## 2022-06-07 MED ORDER — AMLODIPINE BESYLATE 2.5 MG PO TABS: 2.5000 mg | ORAL_TABLET | Freq: Every day | ORAL | 3 refills | Status: DC

## 2022-06-07 NOTE — Progress Notes (Signed)
Phone [phone]   Subjective:  Patient presents today for their annual physical. Chief complaint-noted.   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, bilateral    SVD (spontaneous vaginal delivery)    x 4   Thyroid disease    Patient Active Problem List   Diagnosis Date Noted   History of TIA (transient ischemic attack) 01/01/2022    Priority: High   GERD (gastroesophageal reflux disease) 02/07/2020    Priority: Medium    GAD (generalized anxiety disorder) 06/01/2018    Priority: Medium    Essential hypertension 06/01/2018    Priority: Medium    Hypercholesterolemia 12/10/2011    Priority: Medium    Hypothyroidism 12/10/2011    Priority: Medium    Other and unspecified ovarian cyst 10/25/2013    Priority: Low   Vaginal atrophy 06/03/2013    Priority: Low   Hx of colonic polyps 12/10/2011    Priority: Low   Osteopenia 10/03/2020   Atypical chest pain 01/06/2020   Past Surgical History:  Procedure Laterality Date   ABDOMINAL HYSTERECTOMY     APPENDECTOMY     BREAST SURGERY Right    LUMPECTOMY- FIBROID TUMOR benign   COLONOSCOPY     hx polyps/ [facility]-mother    ENTEROCELE REPAIR     EXCISIONAL HEMORRHOIDECTOMY     fnger surgery  04/2018   POLYPECTOMY      Family History  Problem Relation Age of Onset   Colon cancer Mother 49   Anxiety disorder Mother    Deep vein thrombosis Mother        died age 21   Heart disease Father 44       smoker, high stress job   Colon cancer Brother 55   Hodgkin's lymphoma Brother 27   Colon cancer Maternal Aunt 19   Colon cancer Paternal Grandmother    Breast cancer Daughter    Colon cancer Son 3   Colon cancer Other 32   Colon  polyps Neg Hx    Esophageal cancer Neg Hx    Rectal cancer Neg Hx    Stomach cancer Neg Hx    Transient ischemic attack Neg Hx    Stroke Neg Hx     Medications- reviewed and updated Current Outpatient Medications  Medication Sig Dispense Refill   ALPRAZolam (XANAX) 0.25 MG tablet TAKE 1 TO 2 TABLETS TWICE DAILY AS NEEDED FOR ANXIETY 120 tablet 0   amLODipine (NORVASC) 2.5 MG tablet Take 1 tablet (2.5 mg total) by mouth daily. 90 tablet 3   aspirin 81 MG EC tablet Take 1 tablet (81 mg total) by mouth daily. Swallow whole. 30 tablet 11   Aspirin-Acetaminophen-Caffeine (GOODY HEADACHE PO) Take 1 packet by mouth daily as needed (headache).     busPIRone (BUSPAR) 5 MG tablet TAKE 1 TABLET THREE TIMES DAILY 270 tablet 3   Cholecalciferol (VITAMIN D3 PO) Take 1 capsule by mouth 2 (two) times daily.     ezetimibe (ZETIA) 10 MG tablet Take 1 tablet (10 mg total) by mouth daily. 30 tablet 1   levothyroxine (SYNTHROID) 75 MCG tablet TAKE 1 TABLET EVERY DAY 90 tablet 3   MAGNESIUM PO Take  1 tablet by mouth daily.     omeprazole (PRILOSEC) 40 MG capsule TAKE 1 CAPSULE EVERY DAY 90 capsule 3   Polyethyl Glycol-Propyl Glycol (SYSTANE OP) Apply 1 drop to eye 2 (two) times daily as needed (dry eyes).     valsartan (DIOVAN) 80 MG tablet TAKE 1 TABLET TWICE DAILY 180 tablet 3   vitamin B-12 (CYANOCOBALAMIN) 1000 MCG tablet Take 1,000 mcg by mouth daily.     No current facility-administered medications for this visit.    Allergies-reviewed and updated Allergies  Allergen Reactions   Statins Other (See Comments)    On rosuvastatin-malaise/dizziness and near syncope   Shellfish Allergy Nausea And Vomiting and Other (See Comments)    Severe stomach pains, fever, sweating   Simvastatin Other (See Comments)    Myalgia    Social History   Social History Narrative   Married 61 years in 2019. 4 children, 3 living. 5 grandkids.    Poodle 12.5 in 2019.       Housewife.    Some college- [employer]       Hobbies: travel, walking, work out in yard   Objective  Objective:  BP 138/64   Pulse 75   Temp 98 F (36.7 C)   Ht '5\' 5"'$  (1.651 m)   Wt 128 lb 12.8 oz (58.4 kg)   LMP  (LMP Unknown)   SpO2 98%   BMI 21.43 kg/m  Gen: NAD, resting comfortably HEENT: Mucous membranes are moist. Oropharynx normal Neck: no thyromegaly CV: RRR no murmurs rubs or gallops Lungs: CTAB no crackles, wheeze, rhonchi Abdomen: soft/nontender/nondistended/normal bowel sounds. No rebound or guarding.  Ext: no edema Skin: warm, dry Neuro: grossly normal, moves all extremities, PERRLA   Assessment and Plan   81 y.o. female presenting for annual physical.  Health Maintenance counseling: 1. Anticipatory guidance: Patient counseled regarding regular dental exams - q6 months, eye exams - yearly,  avoiding smoking and second hand smoke , limiting alcohol to 1 beverage per day- never drank , no illicit drugs .   2. Risk factor reduction:  Advised patient of need for regular exercise and diet rich and fruits and vegetables to reduce risk of heart attack and stroke.  Exercise- walking has slowed- heat has bothered her - active in home but not walking regularly- encouraged.  Diet/weight management-Down 8 pounds in the last year-advise no further weight loss. Tsh was slightly low which could contribute- update today Wt Readings from Last 3 Encounters:  06/07/22 128 lb 12.8 oz (58.4 kg)  04/12/22 130 lb 6.4 oz (59.1 kg)  04/03/22 131 lb (59.4 kg)  3. Immunizations/screenings/ancillary studies- flu shot today, covid shot at pharmacy Immunization History  Administered Date(s) Administered   Fluad Quad(high Dose 65+) 05/14/2019, 06/13/2020, 06/27/2021, 06/07/2022   Influenza, High Dose Seasonal PF 07/25/2014, 05/25/2015, 06/26/2016, 06/17/2017, 06/01/2018   Influenza,inj,Quad PF,6+ Mos 06/03/2013   Moderna Sars-Covid-2 Vaccination 09/24/2019, 10/22/2019, 07/10/2020   Pneumococcal Conjugate-13 07/25/2014   Pneumococcal  Polysaccharide-23 07/04/2011   Td 03/08/2021   Tetanus 07/19/2013   Zoster Recombinat (Shingrix) 04/02/2021, 09/21/2021   4. Cervical cancer screening- past age based screening recommendations-no vaginal bleeding or discharge- did not have pelvic exam this year 5. Breast cancer screening-  breast exam-still sees GYN-and mammogram -prefers to still do the GYN 6. Colon cancer screening - has had colonoscopies in the past with GI was recommended no further colonoscopy-letter on file from Dr. Henrene Pastor 7. Skin cancer screening-follows with dermatology. advised regular sunscreen use. Denies worrisome, changing, or  new skin lesions.  8. Birth control/STD check-postmenopausal only active with husband 9. Osteoporosis screening at 2- mild osteopenia reported-follows with GYN  10. Smoking associated screening -former smoker-quit in 1980s-no regular screening  Status of chronic or acute concerns   #History of TIA #hyperlipidemia S: Medication: Aspirin 81 mg -Zetia 10 mg only as statin intolerant in the past- depressed mood on simvastatin 10 mg.   -Livalo expensive - Rosuvastatin 10 mg weekly-did not tolerate with presyncope and malaise Lab Results  Component Value Date   CHOL 222 (H) 01/02/2022   HDL 56 01/02/2022   LDLCALC 149 (H) 01/02/2022   LDLDIRECT 143.0 02/23/2018   TRIG 84 01/02/2022   CHOLHDL 4.0 01/02/2022    A/P: lipids hopefully improved on zetia- still does not tolerate statin. No signs of recurrence of TIA. Continue zetia and aspirin.   #hypertension-new diagnosis 12/18/2020 with history of whitecoat hypertension S: medication: valsartan 80 mg twice daily -stopped amlodipine 5 mg due to edema- had tolerated 2.5 mg in the past Home readings #s: most recently have varied from 108-144/59-79 even without the amlodipine on board  BP Readings from Last 3 Encounters:  06/07/22 138/64  04/12/22 (!) 150/64  04/03/22 128/72  A/P: blood pressure high normal in office- will add amlodipine  back in but at 2.5 mg instead of 5 mg due to swelling on 5 mg    #hypothyroidism S: compliant On thyroid medication-Levothyroxine 43mg Lab Results  Component Value Date   TSH 0.25 (L) 04/03/2022  A/P:TSH slightly off last visit-repeat today and if still off may need adjustment  #GAD/depression anxiety S: Medication: Alprazolam 0.25 mg 3-4 times a day (most 3x a day), buspirone 5 mg 3 times daily-hope is for alprazolam to decrease -Had recommended therapy/counseling in the past and patient have declined -Still strongly prefer patient to be on SSRI-zoloft with worsened depression and fatigue  -07/19/2013 Controlled Substance Contract Signed. - denies regular depressed mood or anhedonia- occasionally can feel down A/P: overall reasonable contorl for nottolerating SSRI- continue currnet medicine   #GERD S: Medication: Omeprazole 40 mg -Check B12 with high risk medication use long-term- she does take this A/P: doing well- could try and recommended 20 mg dose OTC trial- she will consider   Recommended follow up: Return in about 6 months (around 12/07/2022) for followup or sooner if needed.Schedule b4 you leave.  Lab/Order associations: fasting   ICD-10-CM   1. Preventative health care  Z00.00     2. Need for immunization against influenza  Z23 Flu Vaccine QUAD High Dose(Fluad)    3. Acquired hypothyroidism  E03.9 TSH    4. Hypercholesterolemia  E78.00 Comprehensive metabolic panel    LDL cholesterol, direct    5. High risk medication use  Z79.899 B12     Meds ordered this encounter  Medications   amLODipine (NORVASC) 2.5 MG tablet    Sig: Take 1 tablet (2.5 mg total) by mouth daily.    Dispense:  90 tablet    Refill:  3    Return precautions advised.  SGarret Reddish MD

## 2022-06-07 NOTE — Patient Instructions (Addendum)
Please stop by lab before you go If you have mychart- we will send your results within 3 business days of [place] receiving them.  If you do not have mychart- we will call you about results within 5 business days of [place] receiving them.  *please also note that you will see labs on mychart as soon as they post. I will later go in and write notes on them- will say "notes from Dr. Yong Channel"   Let us know if you get covid and RSV shot and location  Recommended follow up: Return in about 6 months (around 12/07/2022) for followup or sooner if needed.Schedule b4 you leave.

## 2022-06-10 ENCOUNTER — Other Ambulatory Visit: Payer: Self-pay

## 2022-06-10 DIAGNOSIS — E039 Hypothyroidism, unspecified: Secondary | ICD-10-CM

## 2022-06-10 MED ORDER — LEVOTHYROXINE SODIUM 75 MCG PO TABS: ORAL_TABLET | ORAL | 3 refills | Status: DC

## 2022-06-12 ENCOUNTER — Telehealth: Payer: Self-pay | Admitting: Family Medicine

## 2022-06-12 NOTE — Telephone Encounter (Signed)
Pharmacist, [facility] at [place] Client Site [place] [facility] [place] Night Provider Garret Reddish- MD Contact Type Call Who Is Calling Patient / Member / Family / Caregiver Caller Name [place] Phone Number [phone] Patient Name [name] Patient DOB [birth year] Call Type Message Only Information Provided Reason for Call Request for Lab/Test Results Initial Comment Caller wants to know what her lab work showed. Additional Comment Declined triage. Disp. Time Disposition Final User 06/11/2022 5:02:22 PM General Information Provided Yes Norton Blizzard Call Closed By: Norton Blizzard Transaction Date/Time: 06/11/2022 4:59:55 PM (ET)

## 2022-06-12 NOTE — Telephone Encounter (Signed)
Called and spoke with pt and results reviewed. 

## 2022-07-02 ENCOUNTER — Telehealth: Payer: Self-pay | Admitting: Cardiology

## 2022-07-02 NOTE — Telephone Encounter (Signed)
*  STAT* If patient is at the pharmacy, call can be transferred to refill team.   1. Which medications need to be refilled? (please list name of each medication and dose if known)   ezetimibe (ZETIA) 10 MG tablet  2. Which pharmacy/location (including street and city if local pharmacy) is medication to be sent to?  [place], [place]  3. Do they need a 30 day or 90 day supply?  90 day  Patient still has some of this medication.

## 2022-07-03 MED ORDER — EZETIMIBE 10 MG PO TABS: 10.0000 mg | ORAL_TABLET | Freq: Every day | ORAL | 3 refills | Status: DC

## 2022-07-17 ENCOUNTER — Other Ambulatory Visit (INDEPENDENT_AMBULATORY_CARE_PROVIDER_SITE_OTHER): Payer: Medicare HMO

## 2022-07-17 DIAGNOSIS — E039 Hypothyroidism, unspecified: Secondary | ICD-10-CM

## 2022-07-17 LAB — TSH: TSH: 0.76 u[IU]/mL (ref 0.35–5.50)

## 2022-07-18 ENCOUNTER — Telehealth: Payer: Self-pay | Admitting: Family Medicine

## 2022-07-18 NOTE — Telephone Encounter (Signed)
Patient states approx. 6 weeks ago, Patient's stools were hard and she started taking more Miralax but it stopped helping and Patient noticed red blood in the toilet which has progressively gotten worse. Patient states every time she has bowel movements the toilet is filled with red, red blood as well as the toilet paper when wiping.  Spoke to Patient Coordinator at Triage Julian Hy) who states Triage Nurse will call Patient when available.

## 2022-07-18 NOTE — Telephone Encounter (Signed)
FYI

## 2022-07-18 NOTE — Telephone Encounter (Signed)
Okay please check and make sure she was evaluated-if not we need to work her in with one of our providers on Friday

## 2022-07-18 NOTE — Telephone Encounter (Signed)
Patient Name: [name] Gender: Female DOB: Oct 15, 1940 Age: 81 Y 9 M 30 D Return Phone Number: [phone] (Primary), [phone] (Secondary) Address: City/ State/ Zip: [place] [address] Client [place] [facility] [place] [facility] Provider Garret Reddish- MD Contact Type Call Who Is Calling Patient / Member / Family / Caregiver Call Type Triage / Clinical Relationship To Patient Self Return Phone Number [phone] (Primary) Chief Complaint Blood In Stool Reason for Call Symptomatic / Request for [place] states she has a pt on the line that approx. 6 weeks ago and her stool was hard and she took stool softener. Her stool has blood in it and it has progressed to the point the toilet would be filled with blood and also the toilet paper. Translation No Nurse Assessment Nurse: Altamease Oiler, RN, Adriana Date/Time (Eastern Time): 07/18/2022 11:49:36 AM Confirm and document reason for call. If symptomatic, describe symptoms. ---pt states about 6 weeks ago there was a little bit of blood and difficulty passing stools. has been getting worse despite miralax. now there is blood in the toilet when she has a bm. has gotten worse in the last 5-7 days. Does the patient have any new or worsening symptoms? ---Yes Will a triage be completed? ---Yes Related visit to physician within the last 2 weeks? ---No Does the PT have any chronic conditions? (i.e. diabetes, asthma, this includes High risk factors for pregnancy, etc.) ---Yes List chronic conditions. ---thyroid high cholesterol htn Is this a behavioral health or substance abuse call? ---No Guidelines Guideline Title Affirmed Question Affirmed Notes Nurse Date/Time (Eastern Time) Rectal Bleeding [1] MODERATE rectal bleeding (small blood clots, passing blood without stool, Altamease Oiler, RN, Adriana 07/18/2022  11:51:51 AM  Guidelines Guideline Title Affirmed Question Affirmed Notes Nurse Date/Time Eilene Ghazi Time) or toilet water turns red) AND [2] more than once a day Disp. Time Eilene Ghazi Time) Disposition Final User 07/18/2022 11:57:02 AM Go to ED Now Yes Altamease Oiler, RN, Adriana Final Disposition 07/18/2022 11:57:02 AM Go to ED Now Yes Altamease Oiler, RN, Darylene Price Disagree/Comply Comply Caller Understands Yes PreDisposition Call Doctor Care Advice Given Per Guideline GO TO ED NOW: CARE ADVICE given per Rectal Bleeding (Adult) guideline. NOTE TO TRIAGER - DRIVING: * Another adult should drive. Referrals [facility] - ED

## 2022-07-19 ENCOUNTER — Other Ambulatory Visit: Payer: Self-pay

## 2022-07-19 ENCOUNTER — Emergency Department (HOSPITAL_COMMUNITY)
Admission: EM | Admit: 2022-07-19 | Discharge: 2022-07-19 | Disposition: A | Discharge status: Home / Self Care | Payer: Medicare HMO | Attending: Emergency Medicine | Admitting: Emergency Medicine

## 2022-07-19 ENCOUNTER — Encounter (HOSPITAL_COMMUNITY): Payer: Self-pay

## 2022-07-19 DIAGNOSIS — Z79899 Other long term (current) drug therapy: Secondary | ICD-10-CM | POA: Diagnosis not present

## 2022-07-19 DIAGNOSIS — R030 Elevated blood-pressure reading, without diagnosis of hypertension: Secondary | ICD-10-CM

## 2022-07-19 DIAGNOSIS — K625 Hemorrhage of anus and rectum: Secondary | ICD-10-CM | POA: Insufficient documentation

## 2022-07-19 DIAGNOSIS — I10 Essential (primary) hypertension: Secondary | ICD-10-CM

## 2022-07-19 DIAGNOSIS — Z7982 Long term (current) use of aspirin: Secondary | ICD-10-CM | POA: Insufficient documentation

## 2022-07-19 DIAGNOSIS — K5900 Constipation, unspecified: Secondary | ICD-10-CM | POA: Diagnosis not present

## 2022-07-19 LAB — COMPREHENSIVE METABOLIC PANEL
ALT: 12 U/L (ref 0–44)
AST: 21 U/L (ref 15–41)
Albumin: 3.6 g/dL (ref 3.5–5.0)
Alkaline Phosphatase: 62 U/L (ref 38–126)
Anion gap: 6 (ref 5–15)
BUN: 17 mg/dL (ref 8–23)
CO2: 27 mmol/L (ref 22–32)
Calcium: 9 mg/dL (ref 8.9–10.3)
Chloride: 106 mmol/L (ref 98–111)
Creatinine, Ser: 1 mg/dL (ref 0.44–1.00)
GFR, Estimated: 56 mL/min — ABNORMAL LOW (ref >60)
Glucose, Bld: 76 mg/dL (ref 70–99)
Potassium: 3.6 mmol/L (ref 3.5–5.1)
Sodium: 139 mmol/L (ref 135–145)
Total Bilirubin: 0.7 mg/dL (ref 0.3–1.2)
Total Protein: 6.2 g/dL — ABNORMAL LOW (ref 6.5–8.1)

## 2022-07-19 LAB — TYPE AND SCREEN
ABO/RH(D): B POS
Antibody Screen: NEGATIVE

## 2022-07-19 LAB — POC OCCULT BLOOD, ED: Fecal Occult Bld: NEGATIVE

## 2022-07-19 LAB — CBC
HCT: 40 % (ref 36.0–46.0)
Hemoglobin: 12.8 g/dL (ref 12.0–15.0)
MCH: 29.7 pg (ref 26.0–34.0)
MCHC: 32 g/dL (ref 30.0–36.0)
MCV: 92.8 fL (ref 80.0–100.0)
Platelets: 225 10*3/uL (ref 150–400)
RBC: 4.31 MIL/uL (ref 3.87–5.11)
RDW: 12.4 % (ref 11.5–15.5)
WBC: 5.9 10*3/uL (ref 4.0–10.5)
nRBC: 0 % (ref 0.0–0.2)

## 2022-07-19 NOTE — Discharge Instructions (Addendum)
It was our pleasure to provide your ER care today - we hope that you feel better.  Your blood work/blood count and stool appear normal.   Follow up with your GI doctor in the next couple weeks  - call office to arrange appointment.    If stools firm/hard, or constipation, make sure to get adequate fiber in diet, stay well hydrated, and take colace (stool softener) 2x/day, and take miralax (laxative) once per day as need.  Also follow up with primary care doctor - have blood pressure rechecked then as it is mildly high today.  Return to ER if worse, new symptoms, heavy bleeding, faint, severe abdominal pain, or other concern.

## 2022-07-19 NOTE — ED Triage Notes (Signed)
Pt arrived POV from home c/o rectal bleeding that pt states has been going on for awhile, since the last time she was in the hospital. Pt states it is bright red blood. Pt does endorse having a hx of hemorrhoids.

## 2022-07-19 NOTE — ED Provider Notes (Signed)
[facility] EMERGENCY DEPARTMENT Provider Note   CSN: [id number] Arrival date & time: 07/19/22  0730     History  Chief Complaint  Patient presents with   Rectal Bleeding    [name] is a 81 y.o. female.  Pt c/o intermittent rectal bleeding in the past several months. States w bm, will noted brb in toilet bowel. Stool is brown, no melena. Denies any other abnormal bruising or bleeding. No vaginal bleeding. No hematuria. No nosebleeding. Denies feeling faint or dizzy. No abd pain. Remote hx colonoscopy, Dr Henrene Pastor.  Intermittent has been straining to have bm, firm stools, has tried miralax prn.   The history is provided by the patient and medical records.  Rectal Bleeding Associated symptoms: no abdominal pain, no epistaxis, no fever, no light-headedness and no vomiting        Home Medications Prior to Admission medications   Medication Sig Start Date End Date Taking? Authorizing Provider  ALPRAZolam Duanne Moron) 0.25 MG tablet TAKE 1 TO 2 TABLETS TWICE DAILY AS NEEDED FOR ANXIETY 05/08/22   Marin Olp, MD  amLODipine (NORVASC) 2.5 MG tablet Take 1 tablet (2.5 mg total) by mouth daily. 06/07/22   Marin Olp, MD  aspirin 81 MG EC tablet Take 1 tablet (81 mg total) by mouth daily. Swallow whole. 01/03/22   Danford, Suann Larry, MD  Aspirin-Acetaminophen-Caffeine (GOODY HEADACHE PO) Take 1 packet by mouth daily as needed (headache).    [provider]  busPIRone (BUSPAR) 5 MG tablet TAKE 1 TABLET THREE TIMES DAILY 05/07/22   Marin Olp, MD  Cholecalciferol (VITAMIN D3 PO) Take 1 capsule by mouth 2 (two) times daily.    [provider]  ezetimibe (ZETIA) 10 MG tablet Take 1 tablet (10 mg total) by mouth daily. 07/03/22   Leonie Man, MD  levothyroxine (SYNTHROID) 75 MCG tablet Take 1 tab 6 days a week and hold on Sundays. 06/10/22   Marin Olp, MD  MAGNESIUM PO Take 1 tablet by mouth daily.    [provider]   omeprazole (PRILOSEC) 40 MG capsule TAKE 1 CAPSULE EVERY DAY 05/08/22   Marin Olp, MD  Polyethyl Glycol-Propyl Glycol (SYSTANE OP) Apply 1 drop to eye 2 (two) times daily as needed (dry eyes).    [provider]  valsartan (DIOVAN) 80 MG tablet TAKE 1 TABLET TWICE DAILY 04/11/22   Marin Olp, MD  vitamin B-12 (CYANOCOBALAMIN) 1000 MCG tablet Take 1,000 mcg by mouth daily.    [provider]      Allergies    Statins, Shellfish allergy, and Simvastatin    Review of Systems   Review of Systems  Constitutional:  Negative for chills and fever.  HENT:  Negative for nosebleeds.   Gastrointestinal:  Positive for hematochezia. Negative for abdominal pain, blood in stool and vomiting.  Genitourinary:  Negative for hematuria.  Neurological:  Negative for light-headedness.    Physical Exam Updated Vital Signs BP (!) 159/77 (BP Location: Right Arm)   Pulse 70   Temp 98.1 F (36.7 C)   Resp 14   Ht 1.626 m ('5\' 4"'$ )   Wt 58.1 kg   LMP  (LMP Unknown)   SpO2 97%   BMI 21.97 kg/m  Physical Exam Vitals and nursing note reviewed.  Constitutional:      Appearance: Normal appearance. She is well-developed.  HENT:     Head: Atraumatic.     Nose: Nose normal.  Mouth/Throat:     Mouth: Mucous membranes are moist.  Eyes:     General: No scleral icterus.    Conjunctiva/sclera: Conjunctivae normal.  Neck:     Trachea: No tracheal deviation.  Cardiovascular:     Rate and Rhythm: Normal rate.     Pulses: Normal pulses.     Heart sounds: Normal heart sounds. No murmur heard.    No friction rub. No gallop.  Pulmonary:     Effort: Pulmonary effort is normal. No respiratory distress.     Breath sounds: Normal breath sounds.  Abdominal:     General: Bowel sounds are normal. There is no distension.     Palpations: Abdomen is soft. There is no mass.     Tenderness: There is no abdominal tenderness. There is no guarding.  Genitourinary:    Comments: Rectal exam  - skin tag ?old external hemorrhoid. No mass felt. Stool is light brown, soft. No obvious fissure or inflamed/bleeding hemorrhoids seen.  Musculoskeletal:        General: No swelling.     Cervical back: Normal range of motion and neck supple. No rigidity. No muscular tenderness.  Skin:    General: Skin is warm and dry.     Findings: No rash.  Neurological:     Mental Status: She is alert.     Comments: Alert, speech normal.   Psychiatric:        Mood and Affect: Mood normal.     ED Results / Procedures / Treatments   Labs (all labs ordered are listed, but only abnormal results are displayed) Results for orders placed or performed during the hospital encounter of 07/19/22  Comprehensive metabolic panel  Result Value Ref Range   Sodium 139 135 - 145 mmol/L   Potassium 3.6 3.5 - 5.1 mmol/L   Chloride 106 98 - 111 mmol/L   CO2 27 22 - 32 mmol/L   Glucose, Bld 76 70 - 99 mg/dL   BUN 17 8 - 23 mg/dL   Creatinine, Ser 1.00 0.44 - 1.00 mg/dL   Calcium 9.0 8.9 - 10.3 mg/dL   Total Protein 6.2 (L) 6.5 - 8.1 g/dL   Albumin 3.6 3.5 - 5.0 g/dL   AST 21 15 - 41 U/L   ALT 12 0 - 44 U/L   Alkaline Phosphatase 62 38 - 126 U/L   Total Bilirubin 0.7 0.3 - 1.2 mg/dL   GFR, Estimated 56 (L) >60 mL/min   Anion gap 6 5 - 15  CBC  Result Value Ref Range   WBC 5.9 4.0 - 10.5 K/uL   RBC 4.31 3.87 - 5.11 MIL/uL   Hemoglobin 12.8 12.0 - 15.0 g/dL   HCT 40.0 36.0 - 46.0 %   MCV 92.8 80.0 - 100.0 fL   MCH 29.7 26.0 - 34.0 pg   MCHC 32.0 30.0 - 36.0 g/dL   RDW 12.4 11.5 - 15.5 %   Platelets 225 150 - 400 K/uL   nRBC 0.0 0.0 - 0.2 %  Type and screen [place]  Result Value Ref Range   ABO/RH(D) B POS    Antibody Screen NEG    Sample Expiration      07/22/2022,2359 Performed at [facility], [address], [place], [place] [address]      EKG None  Radiology No results found.  Procedures Procedures    Medications Ordered in ED Medications - No data to  display  ED Course/ Medical Decision Making/ A&P  Medical Decision Making Problems Addressed: Elevated blood pressure reading: acute illness or injury Essential hypertension: chronic illness or injury with exacerbation, progression, or side effects of treatment Rectal bleeding: acute illness or injury that poses a threat to life or bodily functions  Amount and/or Complexity of Data Reviewed External Data Reviewed: notes. Labs: ordered. Decision-making details documented in ED Course.  Risk OTC drugs. Decision regarding hospitalization.   Continuous pulse ox and cardiac monitoring. Labs ordered/sent.   Diff dx includes rectal bleeding, diverticular bleeding, internal/external hemorrhoid, anal fissure, etc - dispo decision including potential need for admission considered if hgb low, etc. - will get labs and reassess.   Reviewed nursing notes and prior charts for additional history. External reports reviewed.   Cardiac monitor: sinus rhythm, rate 70.  Labs reviewed/interpreted by me - hgb normal.   Pt appears stable for d/c.   Rec pcp/gi f/u.  Colace and miralax prn.   Return precautions provided.            Final Clinical Impression(s) / ED Diagnoses Final diagnoses:  None    Rx / DC Orders ED Discharge Orders     None         Lajean Saver, MD 07/19/22 1303

## 2022-07-19 NOTE — Telephone Encounter (Signed)
Dr. Yong Channel, notified pt is in the ED now.

## 2022-08-29 ENCOUNTER — Telehealth: Payer: Self-pay | Admitting: Family Medicine

## 2022-08-29 NOTE — Telephone Encounter (Signed)
  Encourage patient to contact the pharmacy for refills or they can request refills through [facility]:  Please schedule appointment if longer than 1 year  NEXT APPOINTMENT DATE:  MEDICATION:  ALPRAZolam (XANAX) 0.25 MG tablet   Is the patient out of medication? Yes  PHARMACY: [facility] - [place], [place] Phone: [phone]  Fax: [phone]    Let patient know to contact pharmacy at the end of the day to make sure medication is ready.  Please notify patient to allow 48-72 hours to process

## 2022-08-30 MED ORDER — ALPRAZOLAM 0.25 MG PO TABS: ORAL_TABLET | ORAL | 0 refills | Status: DC

## 2022-08-30 NOTE — Telephone Encounter (Signed)
I sent to mail order as requested- sometimes they do not accept controlled substances

## 2022-09-18 DIAGNOSIS — Z0100 Encounter for examination of eyes and vision without abnormal findings: Secondary | ICD-10-CM | POA: Diagnosis not present

## 2022-09-18 DIAGNOSIS — H524 Presbyopia: Secondary | ICD-10-CM | POA: Diagnosis not present

## 2022-10-29 ENCOUNTER — Observation Stay (HOSPITAL_COMMUNITY)
Admission: EM | Admit: 2022-10-29 | Discharge: 2022-10-30 | Disposition: A | Discharge status: Home / Self Care | Payer: Medicare HMO | Attending: Internal Medicine | Admitting: Internal Medicine

## 2022-10-29 ENCOUNTER — Emergency Department (HOSPITAL_COMMUNITY): Payer: Medicare HMO

## 2022-10-29 DIAGNOSIS — R55 Syncope and collapse: Principal | ICD-10-CM

## 2022-10-29 DIAGNOSIS — Z79899 Other long term (current) drug therapy: Secondary | ICD-10-CM | POA: Insufficient documentation

## 2022-10-29 DIAGNOSIS — R079 Chest pain, unspecified: Secondary | ICD-10-CM | POA: Diagnosis not present

## 2022-10-29 DIAGNOSIS — E871 Hypo-osmolality and hyponatremia: Secondary | ICD-10-CM

## 2022-10-29 DIAGNOSIS — I10 Essential (primary) hypertension: Secondary | ICD-10-CM | POA: Diagnosis not present

## 2022-10-29 DIAGNOSIS — Z1152 Encounter for screening for COVID-19: Secondary | ICD-10-CM | POA: Insufficient documentation

## 2022-10-29 DIAGNOSIS — R42 Dizziness and giddiness: Secondary | ICD-10-CM | POA: Diagnosis not present

## 2022-10-29 DIAGNOSIS — Z8673 Personal history of transient ischemic attack (TIA), and cerebral infarction without residual deficits: Secondary | ICD-10-CM | POA: Insufficient documentation

## 2022-10-29 DIAGNOSIS — R0789 Other chest pain: Secondary | ICD-10-CM | POA: Diagnosis not present

## 2022-10-29 DIAGNOSIS — Z7982 Long term (current) use of aspirin: Secondary | ICD-10-CM | POA: Insufficient documentation

## 2022-10-29 DIAGNOSIS — F411 Generalized anxiety disorder: Secondary | ICD-10-CM | POA: Diagnosis present

## 2022-10-29 DIAGNOSIS — E039 Hypothyroidism, unspecified: Secondary | ICD-10-CM | POA: Diagnosis present

## 2022-10-29 DIAGNOSIS — Z87891 Personal history of nicotine dependence: Secondary | ICD-10-CM | POA: Diagnosis not present

## 2022-10-29 DIAGNOSIS — G4489 Other headache syndrome: Secondary | ICD-10-CM | POA: Diagnosis not present

## 2022-10-29 LAB — CBC
HCT: 37.1 % (ref 36.0–46.0)
Hemoglobin: 12.2 g/dL (ref 12.0–15.0)
MCH: 30.2 pg (ref 26.0–34.0)
MCHC: 32.9 g/dL (ref 30.0–36.0)
MCV: 91.8 fL (ref 80.0–100.0)
Platelets: 191 10*3/uL (ref 150–400)
RBC: 4.04 MIL/uL (ref 3.87–5.11)
RDW: 12.7 % (ref 11.5–15.5)
WBC: 7.4 10*3/uL (ref 4.0–10.5)
nRBC: 0 % (ref 0.0–0.2)

## 2022-10-29 LAB — BASIC METABOLIC PANEL
Anion gap: 7 (ref 5–15)
BUN: 17 mg/dL (ref 8–23)
CO2: 26 mmol/L (ref 22–32)
Calcium: 8.6 mg/dL — ABNORMAL LOW (ref 8.9–10.3)
Chloride: 98 mmol/L (ref 98–111)
Creatinine, Ser: 0.93 mg/dL (ref 0.44–1.00)
GFR, Estimated: >60 mL/min (ref >60)
Glucose, Bld: 104 mg/dL — ABNORMAL HIGH (ref 70–99)
Potassium: 4.1 mmol/L (ref 3.5–5.1)
Sodium: 131 mmol/L — ABNORMAL LOW (ref 135–145)

## 2022-10-29 LAB — URINALYSIS, ROUTINE W REFLEX MICROSCOPIC
Bacteria, UA: NONE SEEN
Bilirubin Urine: NEGATIVE
Glucose, UA: NEGATIVE mg/dL
Hgb urine dipstick: NEGATIVE
Ketones, ur: NEGATIVE mg/dL
Nitrite: NEGATIVE
Protein, ur: NEGATIVE mg/dL
Specific Gravity, Urine: 1.008 (ref 1.005–1.030)
pH: 7 (ref 5.0–8.0)

## 2022-10-29 LAB — RESP PANEL BY RT-PCR (RSV, FLU A&B, COVID)  RVPGX2
Influenza A by PCR: NEGATIVE
Influenza B by PCR: NEGATIVE
Resp Syncytial Virus by PCR: NEGATIVE
SARS Coronavirus 2 by RT PCR: NEGATIVE

## 2022-10-29 LAB — TROPONIN I (HIGH SENSITIVITY): Troponin I (High Sensitivity): 3 ng/L (ref <18)

## 2022-10-29 MED ORDER — ALPRAZOLAM 0.5 MG PO TABS
0.5000 mg | ORAL_TABLET | Freq: Once | ORAL | Status: AC
  Administered 2022-10-29: 0.5 mg via ORAL
  Filled 2022-10-29: qty 1

## 2022-10-29 NOTE — Assessment & Plan Note (Addendum)
-  Presented with non-specific symptoms of feeling fatigue/weakness with brief chest pain, left sided numbness and then proceed to have syncope with loss of consciousness -she has hx of TIA and is compliant with aspirin. Will check CT head to rule out stroke -keep on continuous telemetry and obtain echocardiogram. Troponin and EKG so far are reassuring without signs of ACS. -check TSH although normal 3 months ago and she reports compliance with medication

## 2022-10-29 NOTE — ED Triage Notes (Signed)
BIB [facility] form home for a syncopal episode. Pt states she began to feel "nervous and shaky", took her prescribed Xanax. Shortly after, pt began to feel the same way, and then got very weak and fell into the wall. Pt denies hitting her head. C/o generalized weakness and a headache now. 152/74 BP 64 HR 98% r/a 117 cbg
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
## 2022-10-29 NOTE — ED Provider Notes (Signed)
[facility] Provider Note   CSN: [id number] Arrival date & time: 10/29/22  1957     History  Chief Complaint  Patient presents with   Loss of Consciousness    [name] is a 82 y.o. female with history of hypertension presented to ED with generalized weakness.  The patient reports that she felt lightheaded and fatigued all day, exhausted like her muscles were tired, and had several episodes where she had an ache in her left arm.  She says she began to feel very lightheaded while walking around the house and try to sit down on the couch.  Subsequently she had brief loss of consciousness approximately 1 to 2 minutes per her recollection.  Her husband was with her in the home.  She woke up feeling groggy on the couch afterwards.  She denies any active chest pain continues to feel somewhat lightheaded.  She reports he drinks plenty of water does not feel dehydrated.  She reports a very distant history of syncope when she was a teenager but since then has not had any syncope or near syncope.  She denies any history of MI, arrhythmia, PE, DVT.  She denies any numbness or weakness of the extremities.  HPI     Home Medications Prior to Admission medications   Medication Sig Start Date End Date Taking? Authorizing Provider  ALPRAZolam (XANAX) 0.25 MG tablet TAKE 1 TO 2 TABLETS TWICE DAILY AS NEEDED FOR ANXIETY (ideally just one a day) . Do not drive for 8 hours after taking. Patient taking differently: Take 0.25 mg by mouth 2 (two) times daily as needed for anxiety.  Do not drive for 8 hours after taking. 08/30/22  Yes Marin Olp, MD  amLODipine (NORVASC) 2.5 MG tablet Take 1 tablet (2.5 mg total) by mouth daily. 06/07/22  Yes Marin Olp, MD  aspirin 81 MG EC tablet Take 1 tablet (81 mg total) by mouth daily. Swallow whole. 01/03/22  Yes Danford, Suann Larry, MD  Aspirin-Acetaminophen-Caffeine (GOODY HEADACHE PO) Take 1 packet by mouth  daily as needed (headache).   Yes [provider]  busPIRone (BUSPAR) 5 MG tablet TAKE 1 TABLET THREE TIMES DAILY Patient taking differently: Take 5 mg by mouth 3 (three) times daily. 05/07/22  Yes Marin Olp, MD  Cholecalciferol (VITAMIN D3 PO) Take 1 capsule by mouth 2 (two) times daily.   Yes [provider]  ezetimibe (ZETIA) 10 MG tablet Take 1 tablet (10 mg total) by mouth daily. 07/03/22  Yes Leonie Man, MD  levothyroxine (SYNTHROID) 75 MCG tablet Take 1 tab 6 days a week and hold on Sundays. 06/10/22  Yes Marin Olp, MD  MAGNESIUM PO Take 1 tablet by mouth daily.   Yes [provider]  omeprazole (PRILOSEC) 40 MG capsule TAKE 1 CAPSULE EVERY DAY 05/08/22  Yes Marin Olp, MD  Polyethyl Glycol-Propyl Glycol (SYSTANE OP) Apply 1 drop to eye 2 (two) times daily as needed (dry eyes).   Yes [provider]  valsartan (DIOVAN) 80 MG tablet TAKE 1 TABLET TWICE DAILY Patient taking differently: Take 80 mg by mouth 2 (two) times daily. 04/11/22  Yes Marin Olp, MD  vitamin B-12 (CYANOCOBALAMIN) 1000 MCG tablet Take 1,000 mcg by mouth daily.   Yes [provider]  prednisoLONE acetate (PRED FORTE) 1 % ophthalmic suspension Place 1 drop into both eyes 4 (four) times daily. Patient not taking: Reported on 10/29/2022 09/23/22  [provider]      Allergies    Statins, Shellfish allergy, and Simvastatin    Review of Systems   Review of Systems  Physical Exam Updated Vital Signs BP (!) 150/67   Pulse 66   Temp 97.9 F (36.6 C) (Oral)   Resp 19   Ht '5\' 4"'$  (1.626 m)   Wt 56.7 kg   LMP  (LMP Unknown)   SpO2 95%   BMI 21.46 kg/m  Physical Exam Constitutional:      General: She is not in acute distress. HENT:     Head: Normocephalic and atraumatic.  Eyes:     Conjunctiva/sclera: Conjunctivae normal.     Pupils: Pupils are equal, round, and reactive to light.  Cardiovascular:     Rate and Rhythm: Normal  rate and regular rhythm.  Pulmonary:     Effort: Pulmonary effort is normal. No respiratory distress.  Abdominal:     General: There is no distension.     Tenderness: There is no abdominal tenderness.  Skin:    General: Skin is warm and dry.  Neurological:     General: No focal deficit present.     Mental Status: She is alert and oriented to person, place, and time. Mental status is at baseline.     Sensory: No sensory deficit.     Motor: No weakness.  Psychiatric:        Mood and Affect: Mood normal.        Behavior: Behavior normal.     ED Results / Procedures / Treatments   Labs (all labs ordered are listed, but only abnormal results are displayed) Labs Reviewed  BASIC METABOLIC PANEL - Abnormal; Notable for the following components:      Result Value   Sodium 131 (*)    Glucose, Bld 104 (*)    Calcium 8.6 (*)    All other components within normal limits  URINALYSIS, ROUTINE W REFLEX MICROSCOPIC - Abnormal; Notable for the following components:   Color, Urine STRAW (*)    Leukocytes,Ua SMALL (*)    All other components within normal limits  RESP PANEL BY RT-PCR (RSV, FLU A&B, COVID)  RVPGX2  CBC  TROPONIN I (HIGH SENSITIVITY)  TROPONIN I (HIGH SENSITIVITY)    EKG EKG Interpretation  Date/Time:  Tuesday October 29 2022 20:09:22 EST Ventricular Rate:  61 PR Interval:    QRS Duration: 92 QT Interval:  409 QTC Calculation: 412 R Axis:   75 Text Interpretation: Sinus rhythm Confirmed by Octaviano Glow [phone]) on 10/29/2022 8:26:44 PM  Radiology DG Chest 2 View  Result Date: 10/29/2022 CLINICAL DATA:  Syncope. EXAM: CHEST - 2 VIEW COMPARISON:  12/13/2019 FINDINGS: Lungs are adequately inflated without focal airspace consolidation or effusion. Cardiomediastinal silhouette and remainder of the exam is unchanged. IMPRESSION: No active cardiopulmonary disease. Electronically Signed   By: Marin Olp M.D.   On: 10/29/2022 20:39    Procedures Procedures     Medications Ordered in ED Medications  ALPRAZolam Duanne Moron) tablet 0.5 mg (has no administration in time range)    ED Course/ Medical Decision Making/ A&P Clinical Course as of 10/29/22 2336  Tue Oct 29, 2022  2336 Admitted to Dr Flossie Buffy [MT]    Clinical Course User Index [MT] Langston Masker Carola Rhine, MD                             Medical Decision Making Amount and/or Complexity of  Data Reviewed Labs: ordered. Radiology: ordered.  Risk Prescription drug management. Decision regarding hospitalization.   This patient presents to the ED with concern for lightheadedness, syncope. This involves an extensive number of treatment options, and is a complaint that carries with it a high risk of complications and morbidity.  The differential diagnosis includes arrhythmia versus anemia versus infection versus other  Additional history obtained from EMS  I ordered and personally interpreted labs.  The pertinent results include: No emergent findings on patient's labs  I reviewed her prior medical records including echocardiogram most recent performed in May 2023 which showed a normal EF of 60 to 65%, no significant findings otherwise.  I ordered imaging studies including x-ray of the chest I independently visualized and interpreted imaging which showed no emergent findings I agree with the radiologist interpretation  The patient was maintained on a cardiac monitor.  I personally viewed and interpreted the cardiac monitored which showed an underlying rhythm of: Sinus rhythm  Per my interpretation the patient's ECG shows sinus rhythm with no acute ischemic findings  I ordered medication including Xanax which is the patient's typical evening medication for anxiety  I have reviewed the patients home medicines and have made adjustments as needed  Test Considered: Low suspicion for acute PE or CVA to require CT angiogram of the chest or neuroimaging  After the interventions noted above, I  reevaluated the patient and found that they have: stayed the same  Dispostion:  After consideration of the diagnostic results and the patients response to treatment, I feel that the patent would benefit from observation admission.  She can be maintained on telemetry to evaluate for potential arrhythmia overnight.  It is not clear what caused this episode today but it appears to be quite atypical for this patient, who has not experienced syncope since she was a teenager.         Final Clinical Impression(s) / ED Diagnoses Final diagnoses:  Near syncope    Rx / DC Orders ED Discharge Orders     None         Wyvonnia Dusky, MD 10/29/22 2336

## 2022-10-29 NOTE — H&P (Incomplete)
History and Physical    Patient: [name] [id number] DOB: [birth year] DOA: 10/29/2022 DOS: the patient was seen and examined on 10/29/2022 PCP: Marin Olp, MD  Patient coming from: Home  Chief Complaint:  Chief Complaint  Patient presents with  . Loss of Consciousness   HPI: [name] is a 82 y.o. female with medical history significant of hypertension, hypothyroidism, GAD, GERD, hyperlipidemia, TIA who presents following syncope with loss of consciousness.   In the ED, she was afebrile, BP of 150/67, heart rate of 60, on room air.  CBC without leukocytosis or anemia.  Mild hyponatremia of 131, K of 4.1, creatinine of 0.9 .  Troponin reassuring at 3.  EKG on my review and normal sinus rhythm.  UA is negative.  Negative flu/COVID/RSV. Review of Systems: {ROS_Text:26778} Past Medical History:  Diagnosis Date  . Anxiety   . Arthritis    neck, back  . Hx of colonoscopy with polypectomy   . Hyperlipidemia    diet controlled  . 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 SVD (spontaneous vaginal delivery)    x 4  . Thyroid disease    Past Surgical History:  Procedure Laterality Date  . ABDOMINAL HYSTERECTOMY    . APPENDECTOMY    . BREAST SURGERY Right    LUMPECTOMY- FIBROID TUMOR benign  . COLONOSCOPY     hx polyps/ [facility]-mother   . ENTEROCELE REPAIR    . EXCISIONAL HEMORRHOIDECTOMY    . fnger surgery  04/2018  . POLYPECTOMY     Social History:  reports that she quit smoking about 44 years ago. Her smoking use included cigarettes. She started smoking about 57 years ago. She has a 13.00 pack-year smoking history. She has never used smokeless tobacco. She reports that she does not drink alcohol and does not use drugs.  Allergies  Allergen Reactions  . Statins Other (See Comments)    On rosuvastatin-malaise/dizziness and near syncope  . Shellfish Allergy Nausea And Vomiting and Other (See Comments)    Severe stomach pains, fever, sweating  . Simvastatin Other  (See Comments)    Myalgia    Family History  Problem Relation Age of Onset  . Colon cancer Mother 3  . Anxiety disorder Mother   . Deep vein thrombosis Mother        died age 6  . Heart disease Father 46       smoker, high stress job  . Colon cancer Brother 88  . Hodgkin's lymphoma Brother 19  . Colon cancer Maternal Aunt 22  . Colon cancer Paternal Grandmother   . Breast cancer Daughter   . Colon cancer Son 8  . Colon cancer Other 32  . Colon polyps Neg Hx   . Esophageal cancer Neg Hx   . Rectal cancer Neg Hx   . Stomach cancer Neg Hx   . Transient ischemic attack Neg Hx   . Stroke Neg Hx     Prior to Admission medications   Medication Sig Start Date End Date Taking? Authorizing Provider  ALPRAZolam (XANAX) 0.25 MG tablet TAKE 1 TO 2 TABLETS TWICE DAILY AS NEEDED FOR ANXIETY (ideally just one a day) . Do not drive for 8 hours after taking. Patient taking differently: Take 0.25 mg by mouth 2 (two) times daily as needed for anxiety.  Do not drive for 8 hours after taking. 08/30/22  Yes Marin Olp, MD  amLODipine (NORVASC) 2.5 MG tablet Take 1 tablet (2.5 mg total) by mouth  daily. 06/07/22  Yes Marin Olp, MD  aspirin 81 MG EC tablet Take 1 tablet (81 mg total) by mouth daily. Swallow whole. 01/03/22  Yes Danford, Suann Larry, MD  Aspirin-Acetaminophen-Caffeine (GOODY HEADACHE PO) Take 1 packet by mouth daily as needed (headache).   Yes [provider]  busPIRone (BUSPAR) 5 MG tablet TAKE 1 TABLET THREE TIMES DAILY Patient taking differently: Take 5 mg by mouth 3 (three) times daily. 05/07/22  Yes Marin Olp, MD  Cholecalciferol (VITAMIN D3 PO) Take 1 capsule by mouth 2 (two) times daily.   Yes [provider]  ezetimibe (ZETIA) 10 MG tablet Take 1 tablet (10 mg total) by mouth daily. 07/03/22  Yes Leonie Man, MD  levothyroxine (SYNTHROID) 75 MCG tablet Take 1 tab 6 days a week and hold on Sundays. 06/10/22  Yes Marin Olp, MD   MAGNESIUM PO Take 1 tablet by mouth daily.   Yes [provider]  omeprazole (PRILOSEC) 40 MG capsule TAKE 1 CAPSULE EVERY DAY 05/08/22  Yes Marin Olp, MD  Polyethyl Glycol-Propyl Glycol (SYSTANE OP) Apply 1 drop to eye 2 (two) times daily as needed (dry eyes).   Yes [provider]  valsartan (DIOVAN) 80 MG tablet TAKE 1 TABLET TWICE DAILY Patient taking differently: Take 80 mg by mouth 2 (two) times daily. 04/11/22  Yes Marin Olp, MD  vitamin B-12 (CYANOCOBALAMIN) 1000 MCG tablet Take 1,000 mcg by mouth daily.   Yes [provider]  prednisoLONE acetate (PRED FORTE) 1 % ophthalmic suspension Place 1 drop into both eyes 4 (four) times daily. Patient not taking: Reported on 10/29/2022 09/23/22   [provider]    Physical Exam: Vitals:   10/29/22 2016 10/29/22 2130 10/29/22 2200  BP: (!) 153/80 123/61 (!) 150/67  Pulse: 63 62 66  Resp: '16 13 19  '$ Temp: 97.9 F (36.6 C)    TempSrc: Oral    SpO2: 97% 97% 95%  Weight: 56.7 kg    Height: '5\' 4"'$  (1.626 m)     *** Data Reviewed: {Tip this will not be part of the note when signed- Document your independent interpretation of telemetry tracing, EKG, lab, Radiology test or any other diagnostic tests. Add any new diagnostic test ordered today. (Optional):26781} {Results:26384}  Assessment and Plan: No notes have been filed under this hospital service. Service: Hospitalist     Advance Care Planning:   Code Status: Prior ***  Consults: ***  Family Communication: ***  Severity of Illness: {Observation/Inpatient:21159}  Author: Orene Desanctis, DO 10/29/2022 11:46 PM  For on call review [email or web address].

## 2022-10-29 NOTE — Assessment & Plan Note (Signed)
-   Mild with sodium of 131.  Monitor after receiving fluids.

## 2022-10-30 ENCOUNTER — Encounter (HOSPITAL_COMMUNITY): Payer: Self-pay | Admitting: Family Medicine

## 2022-10-30 ENCOUNTER — Observation Stay (HOSPITAL_COMMUNITY): Payer: Medicare HMO

## 2022-10-30 ENCOUNTER — Observation Stay (HOSPITAL_BASED_OUTPATIENT_CLINIC_OR_DEPARTMENT_OTHER): Payer: Medicare HMO

## 2022-10-30 ENCOUNTER — Other Ambulatory Visit: Payer: Self-pay

## 2022-10-30 DIAGNOSIS — E039 Hypothyroidism, unspecified: Secondary | ICD-10-CM | POA: Diagnosis not present

## 2022-10-30 DIAGNOSIS — F411 Generalized anxiety disorder: Secondary | ICD-10-CM | POA: Diagnosis not present

## 2022-10-30 DIAGNOSIS — R200 Anesthesia of skin: Secondary | ICD-10-CM | POA: Diagnosis not present

## 2022-10-30 DIAGNOSIS — R55 Syncope and collapse: Secondary | ICD-10-CM

## 2022-10-30 DIAGNOSIS — E871 Hypo-osmolality and hyponatremia: Secondary | ICD-10-CM | POA: Diagnosis not present

## 2022-10-30 DIAGNOSIS — I10 Essential (primary) hypertension: Secondary | ICD-10-CM | POA: Diagnosis not present

## 2022-10-30 LAB — BASIC METABOLIC PANEL
Anion gap: 5 (ref 5–15)
BUN: 14 mg/dL (ref 8–23)
CO2: 26 mmol/L (ref 22–32)
Calcium: 9.1 mg/dL (ref 8.9–10.3)
Chloride: 103 mmol/L (ref 98–111)
Creatinine, Ser: 0.95 mg/dL (ref 0.44–1.00)
GFR, Estimated: 59 mL/min — ABNORMAL LOW (ref >60)
Glucose, Bld: 94 mg/dL (ref 70–99)
Potassium: 4.2 mmol/L (ref 3.5–5.1)
Sodium: 134 mmol/L — ABNORMAL LOW (ref 135–145)

## 2022-10-30 LAB — ECHOCARDIOGRAM COMPLETE
Area-P 1/2: 3.15 cm2
Calc EF: 66.4 %
Height: 64 in
S' Lateral: 1.7 cm
Single Plane A2C EF: 67.1 %
Single Plane A4C EF: 63.1 %
Weight: 2057.6 oz

## 2022-10-30 LAB — TROPONIN I (HIGH SENSITIVITY): Troponin I (High Sensitivity): 3 ng/L (ref <18)

## 2022-10-30 MED ORDER — EZETIMIBE 10 MG PO TABS
10.0000 mg | ORAL_TABLET | Freq: Every day | ORAL | Status: DC
  Administered 2022-10-30: 10 mg via ORAL
  Filled 2022-10-30: qty 1

## 2022-10-30 MED ORDER — PANTOPRAZOLE SODIUM 40 MG PO TBEC
40.0000 mg | DELAYED_RELEASE_TABLET | Freq: Every day | ORAL | Status: DC
  Administered 2022-10-30: 40 mg via ORAL
  Filled 2022-10-30: qty 1

## 2022-10-30 MED ORDER — LEVOTHYROXINE SODIUM 50 MCG PO TABS
75.0000 ug | ORAL_TABLET | Freq: Every day | ORAL | Status: DC
  Administered 2022-10-30: 75 ug via ORAL
  Filled 2022-10-30: qty 1

## 2022-10-30 MED ORDER — ACETAMINOPHEN 325 MG PO TABS
650.0000 mg | ORAL_TABLET | Freq: Once | ORAL | Status: AC | PRN
  Administered 2022-10-30: 650 mg via ORAL
  Filled 2022-10-30: qty 2

## 2022-10-30 MED ORDER — ALPRAZOLAM 0.25 MG PO TABS
0.2500 mg | ORAL_TABLET | Freq: Two times a day (BID) | ORAL | Status: DC | PRN
  Administered 2022-10-30: 0.25 mg via ORAL
  Filled 2022-10-30: qty 1

## 2022-10-30 MED ORDER — ASPIRIN 81 MG PO TBEC
81.0000 mg | DELAYED_RELEASE_TABLET | Freq: Every day | ORAL | Status: DC
  Administered 2022-10-30: 81 mg via ORAL
  Filled 2022-10-30: qty 1

## 2022-10-30 MED ORDER — BUSPIRONE HCL 5 MG PO TABS
5.0000 mg | ORAL_TABLET | Freq: Three times a day (TID) | ORAL | Status: DC
  Administered 2022-10-30 (×2): 5 mg via ORAL
  Filled 2022-10-30 (×2): qty 1

## 2022-10-30 MED ORDER — AMLODIPINE BESYLATE 5 MG PO TABS
2.5000 mg | ORAL_TABLET | Freq: Every day | ORAL | Status: DC
  Administered 2022-10-30: 2.5 mg via ORAL
  Filled 2022-10-30: qty 1

## 2022-10-30 MED ORDER — VITAMIN B-12 1000 MCG PO TABS
1000.0000 ug | ORAL_TABLET | Freq: Every day | ORAL | Status: DC
  Filled 2022-10-30: qty 1

## 2022-10-30 MED ORDER — ENOXAPARIN SODIUM 40 MG/0.4ML IJ SOSY
40.0000 mg | PREFILLED_SYRINGE | INTRAMUSCULAR | Status: DC
  Filled 2022-10-30: qty 0.4

## 2022-10-30 MED ORDER — IRBESARTAN 150 MG PO TABS
75.0000 mg | ORAL_TABLET | Freq: Two times a day (BID) | ORAL | Status: DC
  Administered 2022-10-30 (×2): 75 mg via ORAL
  Filled 2022-10-30 (×3): qty 1

## 2022-10-30 NOTE — Progress Notes (Signed)
Patient medication education for discharge was completed.

## 2022-10-30 NOTE — Assessment & Plan Note (Signed)
Continue PRN Xanax

## 2022-10-30 NOTE — Progress Notes (Signed)
Mobility Specialist - Progress Note  Pre-mobility: 92 bpm HR,  During mobility: 126 bpm HR,  Post-mobility: 103 bpm HR,    10/30/22 1524  Mobility  Activity Ambulated independently in hallway  Level of Assistance Independent  Assistive Device None  Distance Ambulated (ft) 700 ft  Range of Motion/Exercises Active  Activity Response Tolerated well  $Mobility charge 1 Mobility   Pt was found on recliner chair and agreeable to ambulate. Had no complaints and at EOS returned to recliner chair with all necessities in reach.  Ferd Hibbs Mobility Specialist

## 2022-10-30 NOTE — Assessment & Plan Note (Signed)
continue levothyroxine

## 2022-10-30 NOTE — ED Notes (Signed)
ED TO INPATIENT HANDOFF REPORT  Name/Age/Gender [name] 82 y.o. female  Code Status    Code Status Orders  (From admission, onward)           Start     Ordered   10/30/22 0005  Full code  Continuous       Question:  By:  Answer:  Consent: discussion documented in EHR   10/30/22 0005           Code Status History     Date Active Date Inactive Code Status Order ID Comments User Context   01/01/2022 1330 01/02/2022 2023 Full Code [id number]  Orma Flaming, MD ED       Home/SNF/Other Home  Chief Complaint Syncope [R55]  Level of Care/Admitting Diagnosis ED Disposition     ED Disposition  Admit   Condition  --   [place]: [facility] [100102]  Level of Care: Telemetry [5]  Admit to tele based on following criteria: Eval of Syncope  May place patient in observation at [facility] or [place] if equivalent level of care is available:: No  Covid Evaluation: Asymptomatic - no recent exposure (last 10 days) testing not required  Diagnosis: Syncope [206001]  Admitting Physician: Orene Desanctis [id number]  Attending Physician: Orene Desanctis [id number]          Medical History 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, bilateral    SVD (spontaneous vaginal delivery)    x 4   Thyroid disease     Allergies Allergies  Allergen Reactions   Statins Other (See Comments)    On rosuvastatin-malaise/dizziness and near syncope   Shellfish Allergy Nausea And Vomiting and Other (See Comments)    Severe stomach pains, fever, sweating   Simvastatin Other (See Comments)    Myalgia    IV Location/Drains/Wounds Patient Lines/Drains/Airways Status     Active Line/Drains/Airways     Name Placement date Placement time Site Days   Peripheral IV 10/29/22 20 G Right Antecubital 10/29/22  2053  Antecubital   1   External Urinary Catheter 10/29/22  2045  --  1            Labs/Imaging Results for orders placed or performed during the hospital encounter of 10/29/22 (from the past 48 hour(s))  Basic metabolic panel     Status: Abnormal   Collection Time: 10/29/22  8:18 PM  Result Value Ref Range   Sodium 131 (L) 135 - 145 mmol/L   Potassium 4.1 3.5 - 5.1 mmol/L   Chloride 98 98 - 111 mmol/L   CO2 26 22 - 32 mmol/L   Glucose, Bld 104 (H) 70 - 99 mg/dL    Comment: Glucose reference range applies only to samples taken after fasting for at least 8 hours.   BUN 17 8 - 23 mg/dL   Creatinine, Ser 0.93 0.44 - 1.00 mg/dL   Calcium 8.6 (L) 8.9 - 10.3 mg/dL   GFR, Estimated >60 >60 mL/min    Comment: (NOTE) Calculated using the CKD-EPI Creatinine Equation (2021)    Anion gap 7 5 - 15    Comment: Performed at [facility], [place] [address] [place], [place] [address]  CBC     Status: None   Collection Time: 10/29/22  8:18  PM  Result Value Ref Range   WBC 7.4 4.0 - 10.5 K/uL   RBC 4.04 3.87 - 5.11 MIL/uL   Hemoglobin 12.2 12.0 - 15.0 g/dL   HCT 37.1 36.0 - 46.0 %   MCV 91.8 80.0 - 100.0 fL   MCH 30.2 26.0 - 34.0 pg   MCHC 32.9 30.0 - 36.0 g/dL   RDW 12.7 11.5 - 15.5 %   Platelets 191 150 - 400 K/uL   nRBC 0.0 0.0 - 0.2 %    Comment: Performed at [facility], [place] [address], [place], [place] [address]  Troponin I (High Sensitivity)     Status: None   Collection Time: 10/29/22  8:18 PM  Result Value Ref Range   Troponin I (High Sensitivity) 3 <18 ng/L    Comment: (NOTE) Elevated high sensitivity troponin I (hsTnI) values and significant  changes across serial measurements may suggest ACS but many other  chronic and acute conditions are known to elevate hsTnI results.  Refer to the "Links" section for chest pain algorithms and additional  guidance. Performed at [facility], [place] [address] [place], [place] [address]   Resp panel  by RT-PCR (RSV, Flu A&B, Covid) Anterior Nasal Swab     Status: None   Collection Time: 10/29/22  8:27 PM   Specimen: Anterior Nasal Swab  Result Value Ref Range   SARS Coronavirus 2 by RT PCR NEGATIVE NEGATIVE    Comment: (NOTE) SARS-CoV-2 target nucleic acids are NOT DETECTED.  The SARS-CoV-2 RNA is generally detectable in upper respiratory specimens during the acute phase of infection. The lowest concentration of SARS-CoV-2 viral copies this assay can detect is 138 copies/mL. A negative result does not preclude SARS-Cov-2 infection and should not be used as the sole basis for treatment or other patient management decisions. A negative result may occur with  improper specimen collection/handling, submission of specimen other than nasopharyngeal swab, presence of viral mutation(s) within the areas targeted by this assay, and inadequate number of viral copies(<138 copies/mL). A negative result must be combined with clinical observations, patient history, and epidemiological information. The expected result is Negative.  Fact Sheet for Patients:  EntrepreneurPulse.com.au  Fact Sheet for Healthcare Providers:  IncredibleEmployment.be  This test is no t yet approved or cleared by the [place] FDA and  has been authorized for detection and/or diagnosis of SARS-CoV-2 by FDA under an Emergency Use Authorization (EUA). This EUA will remain  in effect (meaning this test can be used) for the duration of the COVID-19 declaration under Section 564(b)(1) of the Act, 21 U.S.C.section 360bbb-3(b)(1), unless the authorization is terminated  or revoked sooner.       Influenza A by PCR NEGATIVE NEGATIVE   Influenza B by PCR NEGATIVE NEGATIVE    Comment: (NOTE) The Xpert Xpress SARS-CoV-2/FLU/RSV plus assay is intended as an aid in the diagnosis of influenza from Nasopharyngeal swab specimens and should not be used as a sole basis for treatment. Nasal  washings and aspirates are unacceptable for Xpert Xpress SARS-CoV-2/FLU/RSV testing.  Fact Sheet for Patients: EntrepreneurPulse.com.au  Fact Sheet for Healthcare Providers: IncredibleEmployment.be  This test is not yet approved or cleared by the [place] FDA and has been authorized for detection and/or diagnosis of SARS-CoV-2 by FDA under an Emergency Use Authorization (EUA). This EUA will remain in effect (meaning this test can be used) for the duration of the COVID-19 declaration under Section 564(b)(1) of the Act, 21 U.S.C. section 360bbb-3(b)(1), unless the  authorization is terminated or revoked.     Resp Syncytial Virus by PCR NEGATIVE NEGATIVE    Comment: (NOTE) Fact Sheet for Patients: EntrepreneurPulse.com.au  Fact Sheet for Healthcare Providers: IncredibleEmployment.be  This test is not yet approved or cleared by the [place] FDA and has been authorized for detection and/or diagnosis of SARS-CoV-2 by FDA under an Emergency Use Authorization (EUA). This EUA will remain in effect (meaning this test can be used) for the duration of the COVID-19 declaration under Section 564(b)(1) of the Act, 21 U.S.C. section 360bbb-3(b)(1), unless the authorization is terminated or revoked.  Performed at [facility], [place] [address], [place], [place] [address]   Urinalysis, Routine w reflex microscopic -Urine, Clean Catch     Status: Abnormal   Collection Time: 10/29/22  8:27 PM  Result Value Ref Range   Color, Urine STRAW (A) YELLOW   APPearance CLEAR CLEAR   Specific Gravity, Urine 1.008 1.005 - 1.030   pH 7.0 5.0 - 8.0   Glucose, UA NEGATIVE NEGATIVE mg/dL   Hgb urine dipstick NEGATIVE NEGATIVE   Bilirubin Urine NEGATIVE NEGATIVE   Ketones, ur NEGATIVE NEGATIVE mg/dL   Protein, ur NEGATIVE NEGATIVE mg/dL   Nitrite NEGATIVE NEGATIVE   Leukocytes,Ua SMALL (A) NEGATIVE   RBC /  HPF 0-5 0 - 5 RBC/hpf   WBC, UA 0-5 0 - 5 WBC/hpf   Bacteria, UA NONE SEEN NONE SEEN   Squamous Epithelial / HPF 0-5 0 - 5 /HPF    Comment: Performed at [facility] [place] [address], [place], [place] [address]  Troponin I (High Sensitivity)     Status: None   Collection Time: 10/29/22 10:51 PM  Result Value Ref Range   Troponin I (High Sensitivity) 3 <18 ng/L    Comment: (NOTE) Elevated high sensitivity troponin I (hsTnI) values and significant  changes across serial measurements may suggest ACS but many other  chronic and acute conditions are known to elevate hsTnI results.  Refer to the "Links" section for chest pain algorithms and additional  guidance. Performed at [facility], [place] [address], [place], [place] [address]    DG Chest 2 View  Result Date: 10/29/2022 CLINICAL DATA:  Syncope. EXAM: CHEST - 2 VIEW COMPARISON:  12/13/2019 FINDINGS: Lungs are adequately inflated without focal airspace consolidation or effusion. Cardiomediastinal silhouette and remainder of the exam is unchanged. IMPRESSION: No active cardiopulmonary disease. Electronically Signed   By: Marin Olp M.D.   On: 10/29/2022 20:39    Pending Labs Unresulted Labs (From admission, onward)     Start     Ordered   10/30/22 [id number]  Basic metabolic panel  Tomorrow morning,   R        10/30/22 0005            Vitals/Pain Today's Vitals   10/29/22 2016 10/29/22 2130 10/29/22 2200 10/30/22 0000  BP: (!) 153/80 123/61 (!) 150/67   Pulse: 63 62 66   Resp: '16 13 19   '$ Temp: 97.9 F (36.6 C)   97.9 F (36.6 C)  TempSrc: Oral     SpO2: 97% 97% 95%   Weight: 56.7 kg     Height: '5\' 4"'$  (1.626 m)     PainSc: 0-No pain       Isolation Precautions No active isolations  Medications Medications  enoxaparin (LOVENOX) injection 40 mg (has no administration in time range)  aspirin EC tablet 81 mg (has no administration in time range)  amLODipine (NORVASC) tablet 2.5 mg  (has no administration in time range)  ezetimibe (ZETIA) tablet 10 mg (has no administration in time range)  irbesartan (AVAPRO) tablet 75 mg (has no administration in time range)  ALPRAZolam (XANAX) tablet 0.25 mg (has no administration in time range)  busPIRone (BUSPAR) tablet 5 mg (has no administration in time range)  levothyroxine (SYNTHROID) tablet 75 mcg (has no administration in time range)  pantoprazole (PROTONIX) EC tablet 40 mg (has no administration in time range)  cyanocobalamin (VITAMIN B12) tablet 1,000 mcg (has no administration in time range)  ALPRAZolam (XANAX) tablet 0.5 mg (0.5 mg Oral Given 10/29/22 2340)    Mobility walks with person assist

## 2022-10-30 NOTE — Discharge Summary (Signed)
Physician Discharge Summary  Vicki GIESEKING [id number] DOB: 07-Jul-1941 DOA: 10/29/2022  PCP: Marin Olp, MD  Admit date: 10/29/2022 Discharge date: 10/30/2022  Admitted From: Home  Discharge disposition: Home   Recommendations for Outpatient Follow-Up:   Follow up with your primary care provider in one week.  Check CBC, BMP, magnesium in the next visit  Discharge Diagnosis:   Principal Problem:   Syncope and collapse Active Problems:   Essential hypertension   Hypothyroidism   GAD (generalized anxiety disorder)   Hyponatremia   Syncope   Discharge Condition: Improved.  Diet recommendation: Low sodium, heart healthy.    Wound care: None.  Code status: Full.   History of Present Illness:   [name] is a 82 y.o. female with medical history significant of hypertension, hypothyroidism, GAD, GERD, hyperlipidemia, TIA send hospital with loss of consciousness.  Patient had been having sudden onset of fatigue and weakness felt dizzy with headache and felt like she might pass out followed by syncope for few seconds.  She also had brief midsternal chest pain and left arm numbness for around 20 minutes or so.  In the ED, patient had stable vitals.  Had mild hyponatremia.  Troponin was negative.  EKG showed normal sinus rhythm.  Urinalysis was negative.   Negative flu/COVID/RSV.  Patient was then placed in the hospital for further evaluation and treatment.   Hospital Course:   Following conditions were addressed during hospitalization as listed below,  Syncope and collapse Possibly vasovagal.  Telemetry without any arrhythmias.  Preceded by nonspecific symptoms including fatigue weakness previous chest pain numbness followed by syncope.    CT head done in the ED showed no acute intracranial abnormality but cerebral atrophy. Troponin and EKG negative.  TSH at 0.16 and suppressed.  Patient did have 2D echocardiogram which showed preserved LV function.  Patient stated  that she was in a lot of stress and could have contributed to that.  Essential hypertension Continue amlodipine, Avapro.   Hypothyroidism -continue levothyroxine TSH 0.1.  Might need reduction in the dosage.  Advised to follow-up with her primary care physician in 4 to 6 weeks for TSH follow-up.   GAD (generalized anxiety disorder) Continue PRN Xanax from home.   Hyponatremia Mild with sodium of 134.    Disposition.  At this time, patient is stable for disposition home with outpatient PCP follow-up.  Patient was able to ambulate without any issues prior to discharge.  Medical Consultants:   None.  Procedures:    None Subjective:   Today, patient was seen and examined at bedside.  Wanting to go home.  Feels okay.  Denies any dizziness lightheadedness chest pain shortness of breath.  Was able to ambulate okay without any issues.  Discharge Exam:   Vitals:   10/30/22 0850 10/30/22 1223  BP: (!) 150/61 (!) 145/79  Pulse: 70 74  Resp: 17 18  Temp: (!) 97.5 F (36.4 C) 98.2 F (36.8 C)  SpO2: 98% 97%   Vitals:   10/30/22 0200 10/30/22 0441 10/30/22 0850 10/30/22 1223  BP: (!) 164/78 (!) 153/74 (!) 150/61 (!) 145/79  Pulse:  62 70 74  Resp:  '15 17 18  '$ Temp:  97.8 F (36.6 C) (!) 97.5 F (36.4 C) 98.2 F (36.8 C)  TempSrc:  Oral Oral Oral  SpO2:  100% 98% 97%  Weight:      Height:        General: Alert awake, not in obvious distress, elderly female. HENT:  pupils equally reacting to light,  No scleral pallor or icterus noted. Oral mucosa is moist.  Chest:  Clear breath sounds.  Diminished breath sounds bilaterally. No crackles or wheezes.  CVS: S1 &S2 heard. No murmur.  Regular rate and rhythm. Abdomen: Soft, nontender, nondistended.  Bowel sounds are heard.   Extremities: No cyanosis, clubbing or edema.  Peripheral pulses are palpable. Psych: Alert, awake and oriented, normal mood CNS:  No cranial nerve deficits.  Power equal in all extremities.   Skin: Warm and  dry.  No rashes noted.  The results of significant diagnostics from this hospitalization (including imaging, microbiology, ancillary and laboratory) are listed below for reference.     Diagnostic Studies:   CT HEAD WO CONTRAST (5MM)  Result Date: 10/30/2022 CLINICAL DATA:  Initial evaluation for syncope/presyncope, stroke suspected. Left-sided numbness. EXAM: CT HEAD WITHOUT CONTRAST TECHNIQUE: Contiguous axial images were obtained from the base of the skull through the vertex without intravenous contrast. RADIATION DOSE REDUCTION: This exam was performed according to the departmental dose-optimization program which includes automated exposure control, adjustment of the mA and/or kV according to patient size and/or use of iterative reconstruction technique. COMPARISON:  Prior study from 01/01/2022. FINDINGS: Brain: Mild age-related cerebral atrophy with chronic small vessel ischemic disease. No acute intracranial hemorrhage. No acute large vessel territory infarct. No mass lesion, midline shift or mass effect. No hydrocephalus or extra-axial fluid collection. Vascular: No abnormal hyperdense vessel. Scattered vascular calcifications noted within the carotid siphons. Skull: Scalp soft tissues and calvarium within normal limits. Sinuses/Orbits: Globes and orbital soft tissues demonstrate no acute finding. Mild scattered mucosal thickening noted about the ethmoidal air cells. No mastoid effusion. Other: None. IMPRESSION: 1. No acute intracranial abnormality. 2. Mild age-related cerebral atrophy with chronic small vessel ischemic disease. Electronically Signed   By: Jeannine Boga M.D.   On: 10/30/2022 02:08   DG Chest 2 View  Result Date: 10/29/2022 CLINICAL DATA:  Syncope. EXAM: CHEST - 2 VIEW COMPARISON:  12/13/2019 FINDINGS: Lungs are adequately inflated without focal airspace consolidation or effusion. Cardiomediastinal silhouette and remainder of the exam is unchanged. IMPRESSION: No active  cardiopulmonary disease. Electronically Signed   By: Marin Olp M.D.   On: 10/29/2022 20:39     Labs:   Basic Metabolic Panel: Recent Labs  Lab 10/29/22 2018 10/30/22 0408  NA 131* 134*  K 4.1 4.2  CL 98 103  CO2 26 26  GLUCOSE 104* 94  BUN 17 14  CREATININE 0.93 0.95  CALCIUM 8.6* 9.1   GFR Estimated Creatinine Clearance: 38.7 mL/min (by C-G formula based on SCr of 0.95 mg/dL). Liver Function Tests: No results for input(s): "AST", "ALT", "ALKPHOS", "BILITOT", "PROT", "ALBUMIN" in the last 168 hours. No results for input(s): "LIPASE", "AMYLASE" in the last 168 hours. No results for input(s): "AMMONIA" in the last 168 hours. Coagulation profile No results for input(s): "INR", "PROTIME" in the last 168 hours.  CBC: Recent Labs  Lab 10/29/22 2018  WBC 7.4  HGB 12.2  HCT 37.1  MCV 91.8  PLT 191   Cardiac Enzymes: No results for input(s): "CKTOTAL", "CKMB", "CKMBINDEX", "TROPONINI" in the last 168 hours. BNP: Invalid input(s): "POCBNP" CBG: No results for input(s): "GLUCAP" in the last 168 hours. D-Dimer No results for input(s): "DDIMER" in the last 72 hours. Hgb A1c No results for input(s): "HGBA1C" in the last 72 hours. Lipid Profile No results for input(s): "CHOL", "HDL", "LDLCALC", "TRIG", "CHOLHDL", "LDLDIRECT" in the last 72 hours. Thyroid function studies No  results for input(s): "TSH", "T4TOTAL", "T3FREE", "THYROIDAB" in the last 72 hours.  Invalid input(s): "FREET3" Anemia work up No results for input(s): "VITAMINB12", "FOLATE", "FERRITIN", "TIBC", "IRON", "RETICCTPCT" in the last 72 hours. Microbiology Recent Results (from the past 240 hour(s))  Resp panel by RT-PCR (RSV, Flu A&B, Covid) Anterior Nasal Swab     Status: None   Collection Time: 10/29/22  8:27 PM   Specimen: Anterior Nasal Swab  Result Value Ref Range Status   SARS Coronavirus 2 by RT PCR NEGATIVE NEGATIVE Final    Comment: (NOTE) SARS-CoV-2 target nucleic acids are NOT  DETECTED.  The SARS-CoV-2 RNA is generally detectable in upper respiratory specimens during the acute phase of infection. The lowest concentration of SARS-CoV-2 viral copies this assay can detect is 138 copies/mL. A negative result does not preclude SARS-Cov-2 infection and should not be used as the sole basis for treatment or other patient management decisions. A negative result may occur with  improper specimen collection/handling, submission of specimen other than nasopharyngeal swab, presence of viral mutation(s) within the areas targeted by this assay, and inadequate number of viral copies(<138 copies/mL). A negative result must be combined with clinical observations, patient history, and epidemiological information. The expected result is Negative.  Fact Sheet for Patients:  EntrepreneurPulse.com.au  Fact Sheet for Healthcare Providers:  IncredibleEmployment.be  This test is no t yet approved or cleared by the [place] FDA and  has been authorized for detection and/or diagnosis of SARS-CoV-2 by FDA under an Emergency Use Authorization (EUA). This EUA will remain  in effect (meaning this test can be used) for the duration of the COVID-19 declaration under Section 564(b)(1) of the Act, 21 U.S.C.section 360bbb-3(b)(1), unless the authorization is terminated  or revoked sooner.       Influenza A by PCR NEGATIVE NEGATIVE Final   Influenza B by PCR NEGATIVE NEGATIVE Final    Comment: (NOTE) The Xpert Xpress SARS-CoV-2/FLU/RSV plus assay is intended as an aid in the diagnosis of influenza from Nasopharyngeal swab specimens and should not be used as a sole basis for treatment. Nasal washings and aspirates are unacceptable for Xpert Xpress SARS-CoV-2/FLU/RSV testing.  Fact Sheet for Patients: EntrepreneurPulse.com.au  Fact Sheet for Healthcare Providers: IncredibleEmployment.be  This test is not yet  approved or cleared by the [place] FDA and has been authorized for detection and/or diagnosis of SARS-CoV-2 by FDA under an Emergency Use Authorization (EUA). This EUA will remain in effect (meaning this test can be used) for the duration of the COVID-19 declaration under Section 564(b)(1) of the Act, 21 U.S.C. section 360bbb-3(b)(1), unless the authorization is terminated or revoked.     Resp Syncytial Virus by PCR NEGATIVE NEGATIVE Final    Comment: (NOTE) Fact Sheet for Patients: EntrepreneurPulse.com.au  Fact Sheet for Healthcare Providers: IncredibleEmployment.be  This test is not yet approved or cleared by the [place] FDA and has been authorized for detection and/or diagnosis of SARS-CoV-2 by FDA under an Emergency Use Authorization (EUA). This EUA will remain in effect (meaning this test can be used) for the duration of the COVID-19 declaration under Section 564(b)(1) of the Act, 21 U.S.C. section 360bbb-3(b)(1), unless the authorization is terminated or revoked.  Performed at [facility], [place] [address] [place], [place] [address]      Discharge Instructions:   Discharge Instructions     Diet general   Complete by: As directed    Discharge instructions   Complete by: As directed    Follow-up  with your primary care provider in 1 week.  Increase hydration.  Take time to change positions especially from lying to standing up.   Increase activity slowly   Complete by: As directed       Allergies as of 10/30/2022       Reactions   Statins Other (See Comments)   On rosuvastatin-malaise/dizziness and near syncope   Shellfish Allergy Nausea And Vomiting, Other (See Comments)   Severe stomach pains, fever, sweating   Simvastatin Other (See Comments)   Myalgia        Medication List     STOP taking these medications    prednisoLONE acetate 1 % ophthalmic suspension Commonly known as: PRED  FORTE       TAKE these medications    ALPRAZolam 0.25 MG tablet Commonly known as: XANAX TAKE 1 TO 2 TABLETS TWICE DAILY AS NEEDED FOR ANXIETY (ideally just one a day) . Do not drive for 8 hours after taking. What changed:  how much to take how to take this when to take this reasons to take this additional instructions   amLODipine 2.5 MG tablet Commonly known as: NORVASC Take 1 tablet (2.5 mg total) by mouth daily.   Aspirin Low Dose 81 MG tablet Generic drug: aspirin EC Take 1 tablet (81 mg total) by mouth daily. Swallow whole.   busPIRone 5 MG tablet Commonly known as: BUSPAR TAKE 1 TABLET THREE TIMES DAILY   cyanocobalamin 1000 MCG tablet Commonly known as: VITAMIN B12 Take 1,000 mcg by mouth daily.   ezetimibe 10 MG tablet Commonly known as: ZETIA Take 1 tablet (10 mg total) by mouth daily.   GOODY HEADACHE PO Take 1 packet by mouth daily as needed (headache).   levothyroxine 75 MCG tablet Commonly known as: SYNTHROID Take 1 tab 6 days a week and hold on Sundays.   MAGNESIUM PO Take 1 tablet by mouth daily.   omeprazole 40 MG capsule Commonly known as: PRILOSEC TAKE 1 CAPSULE EVERY DAY   SYSTANE OP Apply 1 drop to eye 2 (two) times daily as needed (dry eyes).   valsartan 80 MG tablet Commonly known as: DIOVAN TAKE 1 TABLET TWICE DAILY   VITAMIN D3 PO Take 1 capsule by mouth 2 (two) times daily.          Time coordinating discharge: 39 minutes  Signed:  Ariahna Smiddy  [facility] 10/30/2022, 3:26 PM

## 2022-10-30 NOTE — Assessment & Plan Note (Signed)
-  continue home antihypertensives

## 2022-10-30 NOTE — Progress Notes (Signed)
Echocardiogram 2D Echocardiogram has been performed.  Frances Furbish 10/30/2022, 1:12 PM

## 2022-10-31 ENCOUNTER — Other Ambulatory Visit: Payer: Self-pay

## 2022-10-31 ENCOUNTER — Telehealth: Payer: Self-pay | Admitting: Family Medicine

## 2022-10-31 DIAGNOSIS — R109 Unspecified abdominal pain: Secondary | ICD-10-CM

## 2022-10-31 NOTE — Telephone Encounter (Signed)
See below

## 2022-10-31 NOTE — Telephone Encounter (Signed)
Could do 4 pm on the 1st- just may have to come back another day for labs depending on when I am able to get in room with her

## 2022-10-31 NOTE — Telephone Encounter (Signed)
Patient discharged from hospital 02/28. States she believes problems are from stomach issues, not heart. Has Hospital F/U 03/13 @ 8:20am;Wants to know if can be seen sooner.

## 2022-11-01 ENCOUNTER — Encounter: Payer: Self-pay | Admitting: Gastroenterology

## 2022-11-01 ENCOUNTER — Telehealth: Payer: Self-pay | Admitting: Internal Medicine

## 2022-11-01 NOTE — Telephone Encounter (Signed)
See below

## 2022-11-01 NOTE — Telephone Encounter (Signed)
Pt scheduled to see Alonza Bogus PA 11/05/22 at 1:30pm. Please let pt know about the appt.

## 2022-11-01 NOTE — Telephone Encounter (Signed)
Called patient and she confirmed appt.  Mailed paperwork

## 2022-11-01 NOTE — Telephone Encounter (Signed)
See if pt can come at 4:20 today.

## 2022-11-01 NOTE — Telephone Encounter (Signed)
Urgent referral in [place] from PCP.  Spoke with patient and she stated she has been in and out of the hospital with abd pain, rectal bleeding and passing out.  Please review and advise scheduling and urgency.  Thanks

## 2022-11-01 NOTE — Telephone Encounter (Signed)
Dr Yong Channel does not have a 4 pm avl for today - please advise

## 2022-11-05 ENCOUNTER — Ambulatory Visit: Payer: Medicare HMO | Admitting: Gastroenterology

## 2022-11-05 ENCOUNTER — Encounter: Payer: Self-pay | Admitting: Gastroenterology

## 2022-11-05 VITALS — BP 136/74 | HR 70 | Ht 64.0 in | Wt 130.0 lb

## 2022-11-05 DIAGNOSIS — K5900 Constipation, unspecified: Secondary | ICD-10-CM

## 2022-11-05 DIAGNOSIS — R55 Syncope and collapse: Secondary | ICD-10-CM | POA: Diagnosis not present

## 2022-11-05 DIAGNOSIS — K625 Hemorrhage of anus and rectum: Secondary | ICD-10-CM | POA: Insufficient documentation

## 2022-11-05 MED ORDER — LINACLOTIDE 72 MCG PO CAPS: 72.0000 ug | ORAL_CAPSULE | Freq: Every day | ORAL | 0 refills | Status: DC

## 2022-11-05 NOTE — Progress Notes (Signed)
Noted  

## 2022-11-05 NOTE — Progress Notes (Signed)
11/05/2022 [name] [id number] April 24, 1941   HISTORY OF PRESENT ILLNESS: This is an 82 year old female who is a patient Dr. Blanch Media.  She is here today with a couple of concerns.  Over the past couple of weeks she has had some episodes of syncope.  She was hospitalized and they did extensive evaluation with CT of the head, echo, EKG and monitoring, etc.  No source identified.  She does have constipation for which she is currently using stool softeners and MiraLAX daily, but the syncope does not necessarily occur in association with constipation and straining.  She is asking what else she could do for her constipation, however.  She also had 2 episodes of rectal bleeding over the past couple of months.  She says that it was quite a large amount, but resolved after one bowel movement each time  The syncope did not occur during or around the time of the bleeding either.  Hemoglobin is normal.  Last colonoscopy was June 2020 at which time she was found to have diverticulosis, hypertrophied anal papilla, and three 1-4 mm polyps removed.  1. 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 2. Surgical [P], descending, polyp - TUBULAR ADENOMA(S). - HIGH GRADE DYSPLASIA IS NOT IDENTIFIED.  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 Her smoking use included cigarettes. She started smoking  about 57 years ago. She has a 13.00 pack-year smoking history. She has never used smokeless tobacco. She reports that she does not drink alcohol and does not use drugs. family history includes Anxiety disorder in her mother; Breast cancer in her daughter; Colon cancer in her paternal grandmother; Colon cancer (age of onset: 67) in an other family member; Colon cancer (age of onset: 68) in her son; Colon cancer (age of onset: 48) in her brother; Colon cancer (age of onset: 33) in her mother; Colon cancer (age of onset: 55) in her maternal aunt; Deep vein thrombosis in her mother; Heart disease (age of onset: 56) in her father; Hodgkin's lymphoma (age of onset: 36) in her brother. Allergies  Allergen Reactions   Statins Other (See Comments)    On rosuvastatin-malaise/dizziness and near syncope   Shellfish Allergy Nausea And Vomiting and Other (See Comments)    Severe stomach pains, fever, sweating   Simvastatin Other (See Comments)    Myalgia      Outpatient Encounter Medications as of 11/05/2022  Medication Sig   ALPRAZolam (XANAX) 0.25 MG tablet TAKE 1 TO 2 TABLETS TWICE DAILY AS NEEDED FOR ANXIETY (ideally just one a day) . Do not drive for 8 hours after taking. (Patient taking differently: Take 0.25 mg by mouth 2 (two) times daily as needed for anxiety.  Do not  drive for 8 hours after taking.)   amLODipine (NORVASC) 2.5 MG tablet Take 1 tablet (2.5 mg total) by mouth daily.   aspirin 81 MG EC tablet Take 1 tablet (81 mg total) by mouth daily. Swallow whole.   Aspirin-Acetaminophen-Caffeine (GOODY HEADACHE PO) Take 1 packet by mouth daily as needed (headache).   busPIRone (BUSPAR) 5 MG tablet TAKE 1 TABLET THREE TIMES DAILY (Patient taking differently: Take 5 mg by mouth 3 (three) times daily.)   Cholecalciferol (VITAMIN D3 PO) Take 1 capsule by mouth 2 (two) times daily.   ezetimibe (ZETIA) 10 MG tablet Take 1 tablet (10 mg total) by mouth daily.   levothyroxine (SYNTHROID) 75 MCG tablet Take 1  tab 6 days a week and hold on Sundays.   linaclotide (LINZESS) 72 MCG capsule Take 1 capsule (72 mcg total) by mouth daily before breakfast. [id number], Exp: [id number]   MAGNESIUM PO Take 1 tablet by mouth daily.   omeprazole (PRILOSEC) 40 MG capsule TAKE 1 CAPSULE EVERY DAY   Polyethyl Glycol-Propyl Glycol (SYSTANE OP) Apply 1 drop to eye 2 (two) times daily as needed (dry eyes).   valsartan (DIOVAN) 80 MG tablet TAKE 1 TABLET TWICE DAILY (Patient taking differently: Take 80 mg by mouth 2 (two) times daily.)   vitamin B-12 (CYANOCOBALAMIN) 1000 MCG tablet Take 1,000 mcg by mouth daily.   No facility-administered encounter medications on file as of 11/05/2022.     REVIEW OF SYSTEMS  : All other systems reviewed and negative except where noted in the History of Present Illness.   PHYSICAL EXAM: BP 136/74   Pulse 70   Ht '5\' 4"'$  (1.626 m)   Wt 130 lb (59 kg)   LMP  (LMP Unknown)   BMI 22.31 kg/m  General: Well developed white female in no acute distress Head: Normocephalic and atraumatic Eyes:  Sclerae anicteric, conjunctiva pink. Ears: Normal auditory acuity Lungs: Clear throughout to auscultation; no W/R/R. Heart: Regular rate and rhythm; no M/R/G. Abdomen: Soft, non-distended.  BS present.  Non-tender. Musculoskeletal: Symmetrical with no gross deformities  Skin: No lesions on visible extremities Extremities: No edema  Neurological: Alert oriented x 4, grossly non-focal Psychological:  Alert and cooperative. Normal mood and affect  ASSESSMENT AND PLAN: *Syncope: Had extensive workup as inpatient recently.  This does not occur in association with rectal bleeding.  Did not occur in association with constipation and straining.  No abdominal pain.  Doubt intra-abdominal pathology, but if we were going to consider evaluation then possibly a CT abdomen and pelvis to look at all structures. *Constipation: Currently using stool softeners and MiraLAX, but wants to know if there is something better  or different she can try.  We discussed increasing the MiraLAX to twice daily.  For now she will try Linzess 72 mcg daily.  She was given samples.  She will call back in 7 to 10 days with an update at which time we can determine if she is going to continue that dose or titrate up and send a prescription. *Rectal bleeding:  Had two episodes of rectal bleeding over the past couple of months.  Somewhat large amounts but resolved after one BM.  ? Hemorrhoidal or low-grade diverticular.  UTD with colonoscopy < 4 years ago.  Hgb normal.   CC:  Marin Olp, MD

## 2022-11-05 NOTE — Patient Instructions (Addendum)
We have given you samples of the following medication to take: Linzess 72 mcg: Take one tablet by mouth once daily 30 minutes before a meal  Please call [name] in 7 to 10 days with an update on how you are doing.  Ask for [employer].  Thank you for entrusting me with your care and for choosing [employer], Alonza Bogus[employer] P[employer] - C.   If your blood pressure at your visit was 140/90 or greater, please contact your primary care physician to follow up on this.  _______________________________________________________  If you are age 83 or older, your body mass index should be between 23-30. Your Body mass index is 22.31 kg/m. If this is out of the aforementioned range listed, please consider follow up with your Primary Care Provider.  If you are age 75 or younger, your body mass index should be between 19-25. Your Body mass index is 22.31 kg/m. If this is out of the aformentioned range listed, please consider follow up with your Primary Care Provider.   ________________________________________________________  The [facility] providers would like to encourage you to use [facility] to communicate with providers for non-urgent requests or questions.  Due to long hold times on the telephone, sending your provider a message by [facility] may be a faster and more efficient way to get a response.  Please allow 48 business hours for a response.  Please remember that this is for non-urgent requests.  _______________________________________________________  Due to recent changes in healthcare laws, you may see the results of your imaging and laboratory studies on MyChart before your provider has had a chance to review them.  We understand that in some cases there may be results that are confusing or concerning to you. Not all laboratory results come back in the same time frame and the provider may be waiting for multiple results in order to interpret others.  Please give [name] 48 hours in order for your  provider to thoroughly review all the results before contacting the office for clarification of your results.

## 2022-11-13 ENCOUNTER — Inpatient Hospital Stay: Payer: Medicare HMO | Admitting: Family Medicine

## 2022-11-14 ENCOUNTER — Telehealth: Payer: Self-pay | Admitting: Gastroenterology

## 2022-11-14 NOTE — Telephone Encounter (Signed)
Inbound call from patient wanted to speak with a nurse in regards [place] .Please advise

## 2022-11-14 NOTE — Telephone Encounter (Signed)
The pt is calling to update on Linzess 72 mcg.  She states it did work well for a few days but has recently become constipated.  She would like to increase the Linzess to 145 mcg.  Ok to send to [facility] pharmacy-please advise Janett Billow

## 2022-11-15 MED ORDER — LINACLOTIDE 145 MCG PO CAPS: 145.0000 ug | ORAL_CAPSULE | Freq: Every day | ORAL | 6 refills | Status: DC

## 2022-11-15 NOTE — Telephone Encounter (Signed)
The pt has been advised that the prescription has been sent as ordered

## 2022-11-20 ENCOUNTER — Other Ambulatory Visit: Payer: Self-pay | Admitting: Family Medicine

## 2022-11-20 NOTE — Telephone Encounter (Signed)
Patient is calling to be updated on Linzess, advised the medication was sent on the date below. Patient is requesting some medication prior, states [employer] takes around 10 days to deliver the medicine.

## 2022-11-21 MED ORDER — LINACLOTIDE 72 MCG PO CAPS: 72.0000 ug | ORAL_CAPSULE | Freq: Every day | ORAL | 1 refills | Status: DC

## 2022-11-21 MED ORDER — LINACLOTIDE 72 MCG PO CAPS: 72.0000 ug | ORAL_CAPSULE | Freq: Every day | ORAL | 0 refills | Status: DC

## 2022-11-21 NOTE — Telephone Encounter (Signed)
Script in for Linzess to patient mail order and local pharmacy per patients request.

## 2022-11-21 NOTE — Telephone Encounter (Signed)
Left message with patient's husband for patient to call office.

## 2022-11-21 NOTE — Addendum Note (Signed)
Addended by: Horris Latino on: 11/21/2022 01:25 PM   Modules accepted: Orders

## 2022-12-05 ENCOUNTER — Ambulatory Visit: Payer: Medicare HMO | Admitting: Family Medicine

## 2022-12-17 ENCOUNTER — Inpatient Hospital Stay: Payer: Medicare HMO | Admitting: Family Medicine

## 2023-01-20 ENCOUNTER — Encounter: Payer: Self-pay | Admitting: Family Medicine

## 2023-01-20 ENCOUNTER — Ambulatory Visit (INDEPENDENT_AMBULATORY_CARE_PROVIDER_SITE_OTHER): Payer: Medicare HMO | Admitting: Family Medicine

## 2023-01-20 VITALS — BP 134/77 | HR 71 | Temp 97.6°F | Ht 64.0 in | Wt 129.0 lb

## 2023-01-20 DIAGNOSIS — E7800 Pure hypercholesterolemia, unspecified: Secondary | ICD-10-CM | POA: Diagnosis not present

## 2023-01-20 DIAGNOSIS — I10 Essential (primary) hypertension: Secondary | ICD-10-CM | POA: Diagnosis not present

## 2023-01-20 DIAGNOSIS — E039 Hypothyroidism, unspecified: Secondary | ICD-10-CM

## 2023-01-20 DIAGNOSIS — G720 Drug-induced myopathy: Secondary | ICD-10-CM

## 2023-01-20 NOTE — Progress Notes (Signed)
Phone [phone] In person visit   Subjective:   [name] is a 82 y.o. year old very pleasant female patient who presents for/with See problem oriented charting Chief Complaint  Patient presents with   Medical Management of Chronic Issues    Wants to discuss why you decreased nerve meds   Hypertension   Gastroesophageal Reflux   Back Pain   flax    Wants to know if flax seed can cause brain bleeds   Extremity Weakness    Pt c/o legs feeling like jello at times.   excercise    Can they do squats   Past Medical History-  Patient Active Problem List   Diagnosis Date Noted   Syncope and collapse 10/29/2022    Priority: High   History of TIA (transient ischemic attack) 01/01/2022    Priority: High   GERD (gastroesophageal reflux disease) 02/07/2020    Priority: Medium    GAD (generalized anxiety disorder) 06/01/2018    Priority: Medium    Essential hypertension 06/01/2018    Priority: Medium    Hypercholesterolemia 12/10/2011    Priority: Medium    Hypothyroidism 12/10/2011    Priority: Medium    Other and unspecified ovarian cyst 10/25/2013    Priority: Low   Vaginal atrophy 06/03/2013    Priority: Low   Hx of colonic polyps 12/10/2011    Priority: Low   Constipation 11/05/2022   Rectal bleeding 11/05/2022   Hyponatremia 10/29/2022   Osteopenia 10/03/2020   Atypical chest pain 01/06/2020    Medications- reviewed and updated Current Outpatient Medications  Medication Sig Dispense Refill   ALPRAZolam (XANAX) 0.25 MG tablet Take 1 tablet (0.25 mg total) by mouth 2 (two) times daily as needed for anxiety.  Do not drive for 8 hours after taking. 60 tablet 0   amLODipine (NORVASC) 2.5 MG tablet Take 1 tablet (2.5 mg total) by mouth daily. 90 tablet 3   aspirin 81 MG EC tablet Take 1 tablet (81 mg total) by mouth daily. Swallow whole. 30 tablet 11   Aspirin-Acetaminophen-Caffeine (GOODY HEADACHE PO) Take 1 packet by mouth daily as needed (headache).      busPIRone (BUSPAR) 5 MG tablet TAKE 1 TABLET THREE TIMES DAILY (Patient taking differently: Take 5 mg by mouth 3 (three) times daily.) 270 tablet 3   Cholecalciferol (VITAMIN D3 PO) Take 1 capsule by mouth 2 (two) times daily.     ezetimibe (ZETIA) 10 MG tablet Take 1 tablet (10 mg total) by mouth daily. 90 tablet 3   levothyroxine (SYNTHROID) 75 MCG tablet Take 1 tab 6 days a week and hold on Sundays. 81 tablet 3   linaclotide (LINZESS) 72 MCG capsule Take 1 capsule (72 mcg total) by mouth daily before breakfast. 30 capsule 0   MAGNESIUM PO Take 1 tablet by mouth daily.     omeprazole (PRILOSEC) 40 MG capsule TAKE 1 CAPSULE EVERY DAY 90 capsule 3   valsartan (DIOVAN) 80 MG tablet TAKE 1 TABLET TWICE DAILY (Patient taking differently: Take 80 mg by mouth 2 (two) times daily.) 180 tablet 3   vitamin B-12 (CYANOCOBALAMIN) 1000 MCG tablet Take 1,000 mcg by mouth daily.     No current facility-administered medications for this visit.     Objective:  BP 134/77 Comment: most recent home reading  Pulse 71   Temp 97.6 F (36.4 C)   Ht 5\' 4"  (1.626 m)   Wt 129 lb (58.5 kg)   LMP  (LMP Unknown)   SpO2  99%   BMI 22.14 kg/m  Gen: NAD, resting comfortably CV: RRR no murmurs rubs or gallops Lungs: CTAB no crackles, wheeze, rhonchi Ext: trace edema Skin: warm, dry     Assessment and Plan    # Vasovagal syncope-related to straining with stool in 2024-has seen GI and started on Linzess to reduce straining along with MiraLAX- miralax didn't help but linzess has  and is continuing this. Since starting linzess no syncopal episodes- has felt very mildly lightheaded - no more straining to point of blood in stool    #History of TIA-now on long-term aspirin  #hyperlipidemia S: Medication: Aspirin 81 mg -Zetia 10 mg only as statin intolerant in the past- depressed mood on simvastatin 10 mg.   -Livalo expensive - Rosuvastatin 10 mg weekly-did not tolerate with presyncope and malaise Lab Results   Component Value Date   CHOL 222 (H) 01/02/2022   HDL 56 01/02/2022   LDLCALC 149 (H) 01/02/2022   LDLDIRECT 100.0 06/07/2022   TRIG 84 01/02/2022   CHOLHDL 4.0 01/02/2022  A/P: lipids above ideal goal for history of TIA but max tolerable dose- statin myalgia- continue current medications  including zetia and aspirin  #hypertension-new diagnosis 12/18/2020 with history of whitecoat hypertension S: medication: amlodipine 2.5, valsartan 80 mg twice daily Home readings #s: high varbaile BP at home from 111-155/60's- 70s for most part. Average around 130/70 likely  BP Readings from Last 3 Encounters:  01/20/23 134/77  11/05/22 136/74  10/30/22 (!) 145/79   A/P: blood pressure reasonably well controlled- with syncope/presyncope history we have to be very cautious- going to tolerate 130s despite TIA history- continue current medications for now    #hypothyroidism S: compliant On thyroid medication-Levothyroxine Lab Results  Component Value Date   TSH 0.76 07/17/2022  A/P:hopefully stable- update TSH today. Continue current meds for now    #GAD/depression anxiety S: Medication: Alprazolam 0.25 mg 3-4 times a day-reduced recently to twice daily on 11/21/2022 especially in light of prior syncope or near syncopal episodes , buspirone 5 mg 3 times daily-hope is for alprazolam to decrease -Had recommended therapy/counseling in the past and patient have declined- declines again  -Still strongly prefer patient to be on SSRI-zoloft with worsened depression and fatigue  A/P: anxiety is slightly worse but she feels at the moment she can tolerate it- we would much prefer lower dose so prefer to stay at 0.25 mg twice daily on alprazolam if we can- if things worsen we could try 3x a day which would still be a reduction form prior 3-4x a day- she understands my concern with her long term health to reduce fall risk   # Flax seed-may increase risk of bleeding slightly- she plans to stop    # Back  pain S:intermittent issues in the thoracic spine, some low back pain- does not always line up with leg weakness and usually does not.   -Also reports leg weakness sensation at times (very sparingly- like legs are low energy an dlike jello). B12 is normal on last check and taking. Sits down and does get better. and wonders if she can do squats  A/P: back pain is intermittent- she wants to monitor- able to still do most of what she wants to do.  - for leg weakness with how intermittent it is and fact she can sit and feel secure- wants to try squats at home on her own and reach out if worsening- then we could refer to sports medicine or physical therapy  potentially.   Recommended follow up: Return in about 6 months (around 07/23/2023) for followup or sooner if needed.Schedule b4 you leave.  Lab/Order associations:   ICD-10-CM   1. Essential hypertension  I10 Comprehensive metabolic panel    CBC with Differential/Platelet    2. Hypercholesterolemia  E78.00 Comprehensive metabolic panel    CBC with Differential/Platelet    3. 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   Return precautions advised.  Tana Conch, MD

## 2023-01-20 NOTE — Patient Instructions (Addendum)
At earliest- fall COVID shot  anxiety is slightly worse but she feels at the moment she can tolerate it- we would much prefer lower dose so prefer to stay at 0.25 mg twice daily on alprazolam if we can- if things worsen we could try 3x a day which would still be a reduction form prior 3-4x a day- she understands my concern with her long term health to reduce fall risk   You are eligible to schedule your annual wellness visit with our nurse specialist Inetta Fermo.  Please consider scheduling this before you leave today  If any more episodes particularly of passing out- seek care immediately but glad you are so much better!   Recommended follow up: Return in about 6 months (around 07/23/2023) for followup or sooner if needed.Schedule b4 you leave.

## 2023-01-21 LAB — COMPREHENSIVE METABOLIC PANEL
ALT: 14 U/L (ref 0–35)
AST: 23 U/L (ref 0–37)
Albumin: 4 g/dL (ref 3.5–5.2)
Alkaline Phosphatase: 56 U/L (ref 39–117)
BUN: 22 mg/dL (ref 6–23)
CO2: 29 mEq/L (ref 19–32)
Calcium: 9.1 mg/dL (ref 8.4–10.5)
Chloride: 101 mEq/L (ref 96–112)
Creatinine, Ser: 0.94 mg/dL (ref 0.40–1.20)
GFR: 56.18 mL/min — ABNORMAL LOW (ref >60.00)
Glucose, Bld: 88 mg/dL (ref 70–99)
Potassium: 4.3 mEq/L (ref 3.5–5.1)
Sodium: 137 mEq/L (ref 135–145)
Total Bilirubin: 0.4 mg/dL (ref 0.2–1.2)
Total Protein: 6.7 g/dL (ref 6.0–8.3)

## 2023-01-21 LAB — CBC WITH DIFFERENTIAL/PLATELET
Basophils Absolute: 0.1 10*3/uL (ref 0.0–0.1)
Basophils Relative: 1.1 % (ref 0.0–3.0)
Eosinophils Absolute: 0.2 10*3/uL (ref 0.0–0.7)
Eosinophils Relative: 3.7 % (ref 0.0–5.0)
HCT: 38.1 % (ref 36.0–46.0)
Hemoglobin: 12.9 g/dL (ref 12.0–15.0)
Lymphocytes Relative: 28.1 % (ref 12.0–46.0)
Lymphs Abs: 1.7 10*3/uL (ref 0.7–4.0)
MCHC: 33.8 g/dL (ref 30.0–36.0)
MCV: 91.5 fl (ref 78.0–100.0)
Monocytes Absolute: 0.5 10*3/uL (ref 0.1–1.0)
Monocytes Relative: 8.2 % (ref 3.0–12.0)
Neutro Abs: 3.6 10*3/uL (ref 1.4–7.7)
Neutrophils Relative %: 58.9 % (ref 43.0–77.0)
Platelets: 237 10*3/uL (ref 150.0–400.0)
RBC: 4.17 Mil/uL (ref 3.87–5.11)
RDW: 13.2 % (ref 11.5–15.5)
WBC: 6.1 10*3/uL (ref 4.0–10.5)

## 2023-01-21 LAB — TSH: TSH: 1.14 u[IU]/mL (ref 0.35–5.50)

## 2023-02-04 ENCOUNTER — Telehealth: Payer: Self-pay | Admitting: Cardiology

## 2023-02-04 NOTE — Telephone Encounter (Signed)
Patient states symptoms going on for a while.  She has been to hospital in the past for this.  She states yesterday was worse.  She has a headache today and weakness. O2 at this time is 97 %. No chest pain, Left arm feels weak.  She states "it will get over after a while" and then stats it has resolved. . She states this happened with the spell she had today. Good grip with left arm hand, no weakness, no slurred speech, no asymmetry of face.  No nausea, vomiting,. Sweating. No SOB. When was at 43 yesterday, she only left on her finger til it read the initial result.  Had no SOB and no blue to lips, no struggling to breath.  Advised if she has another episode where she passes out, or any issues with weakness to one side and facial drooping, slurred speech to go to ED.  Also advised if any chest pain with pain to left arm, Increased SOB, nausea or sweating to also go to ED.  She is aware appt abailable at [facility] with another provider tomorrow but she prefers to see her PC at [facility] as she knows where this office is. We will see her Friday.

## 2023-02-04 NOTE — Telephone Encounter (Signed)
New Message:      Patient says she just had an episode where she was so weak.She said she felt  like she was going  to pass out.    Pt c/o Syncope: STAT if syncope occurred within 30 minutes and pt complains of lightheadedness High Priority if episode of passing out, completely, today or in last 24 hours   Did you pass out today? No, felt like isbhe was going to pass out   When is the last time you passed out?    Has this occurred multiple times? Yesterday and today   Did you have any symptoms prior to passing out? Weakness before she felt like she was going to pass out. She said  she had a headache  after the episode,. Her blood pressure was low yesterday 110/ 62, oxygen was also 88 - patient wanted an appointment -I made one for Friday(02-07-23). She thinks she needs one right away

## 2023-02-07 ENCOUNTER — Ambulatory Visit: Payer: Medicare HMO | Admitting: Cardiology

## 2023-02-13 NOTE — Telephone Encounter (Signed)
Spoke with pt, for the last 6 weeks she has been having more frequent faintly and weak spells. She does not know when they are going to happen and she has to lie down to let them pass. They will last about 5 minutes but she feels like they are lasting longer. She reports her oxygen level is good and her blood pressure is running 122/87/60, 122/70/70 and 110/62/79 even when weak. The only other symptom she has is left arm weakness. No chest pain or palpitations. After the spells are over, she feels fine. Follow up appointment for her to see the NP scheduled. She will call prior to appointment with concerns.

## 2023-02-13 NOTE — Telephone Encounter (Signed)
Pt c/o Syncope: STAT if syncope occurred within 30 minutes and pt complains of lightheadedness High Priority if episode of passing out, completely, today or in last 24 hours   Did you pass out today?   No, but feels like she is going to faint  When is the last time you passed out?   2-2.5 months ago   Has this occurred multiple times?   Mostly daily, except for 1-2 days   Did you have any symptoms prior to passing out?   Patient stated she starts out feeling weak and then feels like she is going to pass out.  Patient stated her left arm feels heavy and she feels like she is going to pass out which lasts from 5 minutes to 1.5 hrs.  Patient stated these spells are becoming more frequent.

## 2023-02-13 NOTE — Progress Notes (Signed)
Cardiology Clinic Note   Patient Name: [name] Date of Encounter: 02/20/2023  Primary Care Provider:  Shelva Majestic, MD Primary Cardiologist:  Bryan Lemma, MD  Patient Profile    82 year old  female with a history of hypertension (white coat), hyperlipidemia, non-cardiac chest pain, described as left upper quadrant and epigastric pain, with discomfort underneath the left rib. TIA on ASA, chronic back pain. . She is unable to tolerate statin, and low dose pravastatin was recommended. Other history to include hypothyroidism, atypical chest pain, and anxiety.  Essentially normal echocardiogram 12/2021 with Grade I diastolic dysfunction.    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, bilateral    SVD (spontaneous vaginal delivery)    x 4   Thyroid disease    Past Surgical History:  Procedure Laterality Date   ABDOMINAL HYSTERECTOMY     APPENDECTOMY     BREAST SURGERY Right    LUMPECTOMY- FIBROID TUMOR benign   COLONOSCOPY     hx polyps/ [facility]-mother    ENTEROCELE REPAIR     EXCISIONAL HEMORRHOIDECTOMY     fnger surgery  04/2018   POLYPECTOMY      Allergies  Allergies  Allergen Reactions   Statins Other (See Comments)    On rosuvastatin-malaise/dizziness and near syncope   Shellfish Allergy Nausea And Vomiting and Other (See Comments)    Severe stomach pains, fever, sweating   Simvastatin Other (See Comments)    Myalgia    History of Present Illness    Mrs. [name] returns today for ongoing assessment and management of hypertension (whitecoat), hyperlipidemia, noncardiac chest pain, hyperlipidemia intolerant to statin but able to tolerate low-dose pravastatin.     Called our office on 02/04/2023 with complaints of feeling as if she were going to faint with associated weakness.  She also complained of her left  arm feeling heavy.  She states that sensation last from 5 minutes to an hour and a half, also stating that the spells are becoming more frequent.  She reported that the symptoms have been going on for about 6 weeks.  She admits to being under a great deal of stress as her daughter has been diagnosed with cancer and recently hospitalized.  She is doing the brunt of her caretaking which is caused her a good bit of anxiety and fatigue.  Her primary care provider has decreased her antianxiety medications over the last 6 weeks to titrate her off of them.  She states over the last few weeks she has noted dizzy spells, and sleepy.  She states that once she was sitting on the couch and the next thing she remembers she had fallen over on the couch.  She has been taking her blood pressures at home and they have been stable at 133/64, 122/70, heart rate in the 70s.  She has been medically compliant.  He had recent labs by her PCP which did not reveal evidence of anemia, renal insufficiency, or hypokalemia.  He denies any chest pain, shortness of breath, or diaphoresis associated with these episodes.  Home Medications    Current Outpatient Medications  Medication Sig Dispense Refill   ALPRAZolam (XANAX) 0.25 MG tablet Take 1 tablet (0.25 mg total) by mouth 2 (two) times daily as needed for anxiety.  Do  not drive for 8 hours after taking. 60 tablet 0   amLODipine (NORVASC) 2.5 MG tablet Take 1 tablet (2.5 mg total) by mouth daily. 90 tablet 3   aspirin 81 MG EC tablet Take 1 tablet (81 mg total) by mouth daily. Swallow whole. 30 tablet 11   Aspirin-Acetaminophen-Caffeine (GOODY HEADACHE PO) Take 1 packet by mouth daily as needed (headache).     busPIRone (BUSPAR) 5 MG tablet TAKE 1 TABLET THREE TIMES DAILY (Patient taking differently: Take 5 mg by mouth 3 (three) times daily.) 270 tablet 3   Cholecalciferol (VITAMIN D3 PO) Take 1 capsule by mouth 2 (two) times daily.     ezetimibe (ZETIA) 10 MG tablet Take 1  tablet (10 mg total) by mouth daily. 90 tablet 3   levothyroxine (SYNTHROID) 75 MCG tablet Take 1 tab 6 days a week and hold on Sundays. 81 tablet 3   linaclotide (LINZESS) 72 MCG capsule Take 1 capsule (72 mcg total) by mouth daily before breakfast. 30 capsule 0   MAGNESIUM PO Take 1 tablet by mouth daily.     omeprazole (PRILOSEC) 40 MG capsule TAKE 1 CAPSULE EVERY DAY 90 capsule 3   valsartan (DIOVAN) 80 MG tablet TAKE 1 TABLET TWICE DAILY (Patient taking differently: Take 80 mg by mouth 2 (two) times daily.) 180 tablet 3   vitamin B-12 (CYANOCOBALAMIN) 1000 MCG tablet Take 1,000 mcg by mouth daily.     No current facility-administered medications for this visit.     Family History    Family History  Problem Relation Age of Onset   Colon cancer Mother 67   Anxiety disorder Mother    Deep vein thrombosis Mother        died age 48   Heart disease Father 16       smoker, high stress job   Colon cancer Brother 50   Hodgkin's lymphoma Brother 27   Colon cancer Maternal Aunt 32   Colon cancer Paternal Grandmother    Breast cancer Daughter    Colon cancer Son 66   Colon cancer Other 32   Colon polyps Neg Hx    Esophageal cancer Neg Hx    Rectal cancer Neg Hx    Stomach cancer Neg Hx    Transient ischemic attack Neg Hx    Stroke Neg Hx    She indicated that her mother is deceased. She indicated that her father is deceased. She indicated that only one of her two brothers is alive. She indicated that her paternal grandmother is deceased. She indicated that her daughter is alive. She indicated that her son is deceased. She indicated that her maternal aunt is deceased. She indicated that the status of her neg hx is unknown. She indicated that her other is deceased.  Social History    Social History   Socioeconomic History   Marital status: Married    Spouse name: Not on file   Number of children: 4   Years of education: Not on file   Highest education level: Not on file   Occupational History   Occupation: retired  Tobacco Use   Smoking status: Former    Packs/day: 1.00    Years: 13.00    Additional pack years: 0.00    Total pack years: 13.00    Types: Cigarettes    Start date: 09/02/1965    Quit date: 09/02/1978    Years since quitting: 44.4   Smokeless tobacco: Never   Tobacco comments:    quit in  1980  Vaping Use   Vaping Use: Never used  Substance and Sexual Activity   Alcohol use: No    Alcohol/week: 0.0 standard drinks of alcohol   Drug use: No   Sexual activity: Not on file  Other Topics Concern   Not on file  Social History Narrative   Married 61 years in 2019. 4 children, 3 living. 5 grandkids.    Poodle 12.5 in 2019.       Housewife.    Some college- [employer]      Hobbies: travel, walking, work out in yard   Social Determinants of Corporate investment banker Strain: Not on file  Food Insecurity: No Food Insecurity (10/30/2022)   Hunger Vital Sign    Worried About Running Out of Food in the Last Year: Never true    Ran Out of Food in the Last Year: Never true  Transportation Needs: No Transportation Needs (10/30/2022)   PRAPARE - Administrator, Civil Service (Medical): No    Lack of Transportation (Non-Medical): No  Physical Activity: Not on file  Stress: Not on file  Social Connections: Not on file  Intimate Partner Violence: Not At Risk (10/30/2022)   Humiliation, Afraid, Rape, and Kick questionnaire    Fear of Current or Ex-Partner: No    Emotionally Abused: No    Physically Abused: No    Sexually Abused: No     Review of Systems    General:  No chills, fever, night sweats or weight changes.  Cardiovascular:  No chest pain, dyspnea on exertion, edema, orthopnea, palpitations, paroxysmal nocturnal dyspnea. Dermatological: No rash, lesions/masses Respiratory: No cough, dyspnea Urologic: No hematuria, dysuria Abdominal:   No nausea, vomiting, diarrhea, bright red blood per rectum, melena, or  hematemesis Neurologic:  No visual changes, wkns, changes in mental status.  Positive for near syncope and dizziness. All other systems reviewed and are otherwise negative except as noted above.     Physical Exam    VS:  BP (!) 154/88   Pulse 63   Ht 5\' 4"  (1.626 m)   Wt 129 lb (58.5 kg)   LMP  (LMP Unknown)   SpO2 100%   BMI 22.14 kg/m  , BMI Body mass index is 22.14 kg/m.     GEN: Well nourished, well developed, in no acute distress. HEENT: normal. Neck: Supple, no JVD, carotid bruits, or masses. Cardiac: RRR, no murmurs, rubs, or gallops. No clubbing, cyanosis, edema.  Radials/DP/PT 2+ and equal bilaterally.  Respiratory:  Respirations regular and unlabored, clear to auscultation bilaterally. GI: Soft, nontender, nondistended, BS + x 4. MS: no deformity or atrophy. Skin: warm and dry, no rash. Neuro:  Strength and sensation are intact. Psych: Normal affect.  Easily tearful  Accessory Clinical Findings    ECG personally reviewed by me today-not completed this office visit.  Lab Results  Component Value Date   WBC 6.1 01/20/2023   HGB 12.9 01/20/2023   HCT 38.1 01/20/2023   MCV 91.5 01/20/2023   PLT 237.0 01/20/2023   Lab Results  Component Value Date   CREATININE 0.94 01/20/2023   BUN 22 01/20/2023   NA 137 01/20/2023   K 4.3 01/20/2023   CL 101 01/20/2023   CO2 29 01/20/2023   Lab Results  Component Value Date   ALT 14 01/20/2023   AST 23 01/20/2023   ALKPHOS 56 01/20/2023   BILITOT 0.4 01/20/2023   Lab Results  Component Value Date   CHOL 222 (H)  01/02/2022   HDL 56 01/02/2022   LDLCALC 149 (H) 01/02/2022   LDLDIRECT 100.0 06/07/2022   TRIG 84 01/02/2022   CHOLHDL 4.0 01/02/2022    Lab Results  Component Value Date   HGBA1C 5.2 01/01/2022    Review of Prior Studies: Echocardiogram 01/02/2022 1. Left ventricular ejection fraction, by estimation, is 60 to 65%. The  left ventricle has normal function. The left ventricle has no regional  wall  motion abnormalities. Left ventricular diastolic parameters are  consistent with Grade I diastolic  dysfunction (impaired relaxation).   2. Right ventricular systolic function is normal. The right ventricular  size is normal.   3. The mitral valve is normal in structure. No evidence of mitral valve  regurgitation. No evidence of mitral stenosis.   4. The aortic valve is normal in structure. Aortic valve regurgitation is  not visualized. No aortic stenosis is present.   5. The inferior vena cava is normal in size with greater than 50%  respiratory variability, suggesting right atrial pressure of 3 mmHg.   Assessment & Plan   1.  Near syncope: Will place a 2-week ZIO monitor to evaluate for cardiac arrhythmias, pauses, or atrial fibrillation.  Carotid artery ultrasound will also be completed to evaluate for blood flow issues.  She has a history of a previous TIA.  Continue aspirin therapy.  If this continues may need to follow-up with the MRI of the brain for further evaluation.  2.  Hypertension: Blood pressure is well-controlled on amlodipine 2.5 mg daily and valsartan 80 mg daily.  Blood pressures at home which she is reported and brought with her are stable.  Will not make any changes in her medication regimen at this time.  3.  Hypercholesterolemia: Continue Zetia as directed.  Labs are followed by PCP.  4.  Situational stress and anxiety: Daughter recently diagnosed with cancer.  The patient has been her primary caregiver along with her daughter's husband.  She is under good deal of stress related to this.  She will follow-up with her primary care provider concerning medication adjustments and possible supportive counseling.           Signed, Bettey Mare. Liborio Nixon, ANP, AACC   02/20/2023 4:32 PM      Office [phone] Fax [phone]  Notice: This dictation was prepared with Dragon dictation along with smaller phrase technology. Any transcriptional errors that result  from this process are unintentional and may not be corrected upon review.

## 2023-02-20 ENCOUNTER — Encounter: Payer: Self-pay | Admitting: Adult Health

## 2023-02-20 ENCOUNTER — Ambulatory Visit (INDEPENDENT_AMBULATORY_CARE_PROVIDER_SITE_OTHER): Payer: Medicare HMO

## 2023-02-20 ENCOUNTER — Other Ambulatory Visit: Payer: Self-pay | Admitting: Adult Health

## 2023-02-20 ENCOUNTER — Ambulatory Visit: Payer: Medicare HMO | Attending: Adult Health | Admitting: Adult Health

## 2023-02-20 VITALS — BP 154/88 | HR 63 | Ht 64.0 in | Wt 129.0 lb

## 2023-02-20 DIAGNOSIS — R55 Syncope and collapse: Secondary | ICD-10-CM

## 2023-02-20 DIAGNOSIS — E7800 Pure hypercholesterolemia, unspecified: Secondary | ICD-10-CM | POA: Diagnosis not present

## 2023-02-20 DIAGNOSIS — I10 Essential (primary) hypertension: Secondary | ICD-10-CM | POA: Diagnosis not present

## 2023-02-20 DIAGNOSIS — Z8673 Personal history of transient ischemic attack (TIA), and cerebral infarction without residual deficits: Secondary | ICD-10-CM

## 2023-02-20 NOTE — Patient Instructions (Signed)
Medication Instructions:  No Changes *If you need a refill on your cardiac medications before your next appointment, please call your pharmacy*   Lab Work: No labs If you have labs (blood work) drawn today and your tests are completely normal, you will receive your results only by: MyChart Message (if you have MyChart) OR A paper copy in the mail If you have any lab test that is abnormal or we need to change your treatment, we will call you to review the results.   Testing/Procedures: [address],  [address]. Your physician has requested that you have a carotid duplex. This test is an ultrasound of the carotid arteries in your neck. It looks at blood flow through these arteries that supply the brain with blood. Allow one hour for this exam. There are no restrictions or special instructions.   ZIO AT Long term monitor-Live Telemetry  Your physician has requested you wear a ZIO patch monitor for 14 days.  This is a single patch monitor. [employer] supplies one patch monitor per enrollment. Additional  stickers are not available.  Please do not apply patch if you will be having a Nuclear Stress Test, Echocardiogram, Cardiac CT, MRI,  or Chest Xray during the period you would be wearing the monitor. The patch cannot be worn during  these tests. You cannot remove and re-apply the ZIO AT patch monitor.  Your ZIO patch monitor will be mailed 3 day USPS to your address on file. It may take 3-5 days to  receive your monitor after you have been enrolled.  Once you have received your monitor, please review the enclosed instructions. Your monitor has  already been registered assigning a specific monitor serial # to you.   Billing and Patient Assistance Program information  Meredeth Ide has been supplied with any insurance information on record for billing. [employer] offers a sliding scale Patient Assistance Program for patients without insurance, or whose  insurance does not completely cover the  cost of the ZIO patch monitor. You must apply for the  Patient Assistance Program to qualify for the discounted rate. To apply, call [employer] at [phone],  select option 4, select option 2 , ask to apply for the Patient Assistance Program, (you can request an  interpreter if needed). [employer] will ask your household income and how many people are in your  household. [employer] will quote your out-of-pocket cost based on this information. They will also be able  to set up a 12 month interest free payment plan if needed.  Applying the monitor   Shave hair from upper left chest.  Hold the abrader disc by orange tab. Rub the abrader in 40 strokes over left upper chest as indicated in  your monitor instructions.  Clean area with 4 enclosed alcohol pads. Use all pads to ensure the area is cleaned thoroughly. Let  dry.  Apply patch as indicated in monitor instructions. Patch will be placed under collarbone on left side of  chest with arrow pointing upward.  Rub patch adhesive wings for 2 minutes. Remove the white label marked "1". Remove the white label  marked "2". Rub patch adhesive wings for 2 additional minutes.  While looking in a mirror, press and release button in center of patch. A small green light will flash 3-4  times. This will be your only indicator that the monitor has been turned on.  Do not shower for the first 24 hours. You may shower after the first 24 hours.  Press the button  if you feel a symptom. You will hear a small click. Record Date, Time and Symptom in  the Patient Log.   Starting the Gateway  In your kit there is a Audiological scientist box the size of a cellphone. This is Buyer, retail. It transmits all your  recorded data to [facility]. This box must always stay within 10 feet of you. Open the box and push the *  button. There will be a light that blinks orange and then green a few times. When the light stops  blinking, the Gateway is connected to the ZIO patch. Call [employer] at  [phone] to confirm your monitor is transmitting.  Returning your monitor  Remove your patch and place it inside the Gateway. In the lower half of the Gateway there is a white  bag with prepaid postage on it. Place Gateway in bag and seal. Mail package back to [employer] as soon as  possible. Your physician should have your final report approximately 7 days after you have mailed back  your monitor. Call [facility] Customer Care at [phone] if you have questions regarding your ZIO AT  patch monitor. Call them immediately if you see an orange light blinking on your monitor.  If your monitor falls off in less than 4 days, contact our Monitor department at [phone]. If your  monitor becomes loose or falls off after 4 days call [employer] at [phone] for suggestions on  securing your monitor   Your next appointment:   6 week(s)  Provider:   Joni Reining, DNP, ANP   or, Bryan Lemma, MD

## 2023-02-20 NOTE — Progress Notes (Unsigned)
Enrolled patient for a 14 day Zio AT Monitor to be mailed to patients home  [name] to read

## 2023-02-25 ENCOUNTER — Other Ambulatory Visit: Payer: Self-pay | Admitting: Family Medicine

## 2023-02-26 DIAGNOSIS — R55 Syncope and collapse: Secondary | ICD-10-CM

## 2023-02-27 ENCOUNTER — Ambulatory Visit (HOSPITAL_COMMUNITY)
Admission: RE | Admit: 2023-02-27 | Discharge: 2023-02-27 | Disposition: A | Discharge status: Home / Self Care | Payer: Medicare HMO | Source: Ambulatory Visit | Attending: Adult Health | Admitting: Adult Health

## 2023-02-27 DIAGNOSIS — R55 Syncope and collapse: Secondary | ICD-10-CM | POA: Insufficient documentation

## 2023-02-28 ENCOUNTER — Telehealth: Payer: Self-pay

## 2023-02-28 NOTE — Telephone Encounter (Addendum)
Called patient regarding results. Patient had understanding of results.----- Message from Jodelle Gross, NP sent at 02/27/2023  5:27 PM EDT ----- Normal carotid ultrasound. No blockages seen. Good report. No changes in her regimen.  KL

## 2023-03-24 ENCOUNTER — Telehealth: Payer: Self-pay

## 2023-03-24 NOTE — Telephone Encounter (Addendum)
Called patient regarding results. Patient had understanding of results.----- Message from Joni Reining sent at 03/24/2023  7:14 AM EDT ----- Zio monitor results have been reviewed. She had a short run of fast HR but not concerning. Heart rate is predominately normal with a few skipped beats. No plans to change her treatment.  Give reassurance that she does not have any heart rhythms that are worrisome.

## 2023-03-28 ENCOUNTER — Other Ambulatory Visit: Payer: Self-pay | Admitting: Family Medicine

## 2023-04-01 ENCOUNTER — Ambulatory Visit: Payer: Medicare HMO | Admitting: Adult Health

## 2023-04-12 ENCOUNTER — Other Ambulatory Visit: Payer: Self-pay | Admitting: Family Medicine

## 2023-04-12 ENCOUNTER — Other Ambulatory Visit: Payer: Self-pay | Admitting: Cardiology

## 2023-04-14 DIAGNOSIS — L821 Other seborrheic keratosis: Secondary | ICD-10-CM | POA: Diagnosis not present

## 2023-04-14 DIAGNOSIS — B079 Viral wart, unspecified: Secondary | ICD-10-CM | POA: Diagnosis not present

## 2023-04-15 ENCOUNTER — Telehealth: Payer: Self-pay | Admitting: Family Medicine

## 2023-04-15 NOTE — Telephone Encounter (Signed)
What are her home blood pressures without medication?

## 2023-04-15 NOTE — Telephone Encounter (Signed)
See below

## 2023-04-15 NOTE — Telephone Encounter (Signed)
Patient states she stopped talking Amlodipine 2 weeks ago due to swelling. Patient states she is feeling much better without it.  Requests to be advised if Dr. Durene Cal would like to prescribe a different medication.

## 2023-04-16 NOTE — Telephone Encounter (Signed)
Called and spoke with pt and the only 2 readings she has sent stopping Amlodipine are 133/69  and this morning 139/73.

## 2023-04-16 NOTE — Telephone Encounter (Signed)
We can stay off medication for now but if blood pressure gets above 140/90 on average we will need to reconsider-have her go ahead and check this daily or every other day

## 2023-04-16 NOTE — Telephone Encounter (Signed)
Called and spoke with pt and below message given. 

## 2023-04-28 ENCOUNTER — Other Ambulatory Visit: Payer: Self-pay | Admitting: Gastroenterology

## 2023-05-07 ENCOUNTER — Other Ambulatory Visit: Payer: Self-pay | Admitting: Family Medicine

## 2023-05-14 ENCOUNTER — Telehealth: Payer: Self-pay

## 2023-05-14 NOTE — Telephone Encounter (Signed)
Please call and schedule awv for pt to close the [facility].

## 2023-05-15 NOTE — Telephone Encounter (Signed)
FYI

## 2023-05-15 NOTE — Telephone Encounter (Signed)
Please see Karen's message below.

## 2023-05-15 NOTE — Telephone Encounter (Signed)
I do not need to do see declined annual wellness visits in the future-but thank you

## 2023-06-13 ENCOUNTER — Other Ambulatory Visit: Payer: Self-pay | Admitting: Family Medicine

## 2023-06-13 DIAGNOSIS — E039 Hypothyroidism, unspecified: Secondary | ICD-10-CM

## 2023-06-18 ENCOUNTER — Other Ambulatory Visit: Payer: Self-pay | Admitting: Family Medicine

## 2023-06-25 ENCOUNTER — Ambulatory Visit: Payer: Medicare HMO | Admitting: Internal Medicine

## 2023-06-25 ENCOUNTER — Encounter: Payer: Self-pay | Admitting: Internal Medicine

## 2023-06-25 VITALS — BP 176/80 | HR 74 | Temp 98.4°F | Ht 64.0 in | Wt 128.8 lb

## 2023-06-25 DIAGNOSIS — M79605 Pain in left leg: Secondary | ICD-10-CM | POA: Diagnosis not present

## 2023-06-25 DIAGNOSIS — M79604 Pain in right leg: Secondary | ICD-10-CM | POA: Diagnosis not present

## 2023-06-25 DIAGNOSIS — I739 Peripheral vascular disease, unspecified: Secondary | ICD-10-CM | POA: Insufficient documentation

## 2023-06-25 NOTE — Patient Instructions (Signed)
VISIT SUMMARY:  Today, we discussed your worsening leg pain, which has been present for the past one to two months. The pain is mainly in your right leg, extending from the calf to mid-thigh, and to a lesser extent in your left calf. You described the pain as a 'spring-like' sensation, and noted that it can be very severe at times. We reviewed your history, including your previous transient ischemic attack (TIA) and current medications.  YOUR PLAN:  -LEG PAIN: Leg pain can have various causes, including vascular issues, nerve problems, or muscle strain. Given your description and the relief you found with Goody powder, we suspect a possible vascular cause. We will perform an Ankle-Brachial Index (ABI) test to check for peripheral arterial disease, which is a condition where the blood vessels in the legs are narrowed or blocked. We are also increasing your aspirin dose to 325mg  daily to help improve blood flow. Please follow up in one week to review the test results and reassess your symptoms.  INSTRUCTIONS:  Please schedule a follow-up appointment in one week to review the results of your Ankle-Brachial Index (ABI) test and reassess your symptoms. Continue taking the increased dose of aspirin (325mg  daily) as prescribed.

## 2023-06-25 NOTE — Progress Notes (Signed)
[facility]: [phone]   -- Medical Office Visit --  Patient:  [name]      Age: 82 y.o.       Sex:  female  Date:   06/25/2023 Patient Care Team: Shelva Majestic, MD as PCP - General (Family Medicine) Marykay Lex, MD as PCP - Cardiology (Cardiology) Today's Healthcare Provider: Lula Olszewski, MD   Assessment   Plan      Assessment & Plan Intermittent claudication [facility]) Suspected although we can't induce it   . Given the presentation, there are several concerning features that warrant careful consideration: Key elements to consider:  Age 72 - increased risk for vascular issues Bilateral leg pain, R>L Calf-focused with radiation to mid-thigh No back pain or radicular symptoms Negative straight leg raise Cannot reproduce with activity Intermittent but severe Responds to NSAIDs (Goody's powder) History of TIA - suggesting underlying vascular risk factors  Given this presentation, the top concerns would be:  Vascular claudication   BUT: Cannot reproduce with walking Bilateral nature fits History of TIA suggests vascular risk factors   Critical limb ischemia Given age and vascular history Can be bilateral Can present with rest pain  Check bilateral pulses (dorsalis pedis and posterior tibial) 2+ posterior tibial bilateral  Compare bilateral temperature and color - good pulses, color, and warmth Measure ABIs   Additional workup to consider ON follow up NEXT week.  Arterial duplex ultrasound CBC, ESR, CRP to rule out vasculitis Basic metabolic panel Lipid panel  Given her age, vascular history, and pain characteristics, this needs prompt vascular evaluation to rule out critical limb ischemia or severe peripheral arterial disease.   Leg pain, bilateral She presents with bilateral leg pain, more severe in the right leg, extending from the calf to mid-thigh on the right and localized to the calf on the left. The pain,  described as a "spring-like" sensation, is not associated with numbness or tingling and does not worsen with a straight leg raise test, indicating it is unlikely related to a back issue. Relief from Goody powder, which contains aspirin, suggests a possible vascular cause, yet normal foot pulses complicate the diagnosis. We will order an Ankle-Brachial Index (ABI) to evaluate for potential peripheral arterial disease and increase the aspirin dose to 325mg  daily to improve blood flow. A follow-up appointment is scheduled in 1 week to review test results and reassess symptoms.  Peripheral arterial disease (HCC)  Assessment and Plan    ED Discharge Orders          Ordered    VAS [place] ABI WITH/WO TBI        06/25/23 1609          Diagnoses and all orders for this visit: Intermittent claudication (HCC) -     VAS [place] ABI WITH/WO TBI; Future Leg pain, bilateral  Recommended follow-up: 1 week with ankle-brachial index results. Take aspirin 325 in meantime. Future Appointments  Date Time Provider Department Center  07/02/2023  3:00 PM Lula Olszewski, MD [facility]  07/28/2023  1:00 PM Shelva Majestic, MD [facility]        Subjective   82 y.o. female who has Hypercholesterolemia; Hx of colonic polyps; Hypothyroidism; Vaginal atrophy; Other and unspecified ovarian cyst; GAD (generalized anxiety disorder); Essential hypertension; Atypical chest pain; GERD (gastroesophageal reflux disease); Osteopenia; History of TIA (transient ischemic attack); Hyponatremia; Syncope and collapse; Constipation; Rectal bleeding; and Leg pain, bilateral on their problem list.. Their main reasons/main concerns/chief  complaints for today's office visit are Leg Pain (For a few months from the knee down, requesting referral possibly.)   ------------------------------------------------------------------------------------------------------------------------ AI-Extracted: Discussed the use of AI scribe software for  clinical note transcription with the patient, who gave verbal consent to proceed.  History of Present Illness   The patient, an 82 year old with a history of transient ischemic attack (TIA), presents with a chief complaint of worsening leg pain over the past one to two months. The pain is described as a sensation of 'springs' moving within the legs, predominantly on the right side but also to a lesser extent on the left. The pain is most severe in the calf region, extending up to the mid-thigh on the right side, and does not extend above the knee on the left. The patient reports that the pain comes and goes, and at times can be very severe, particularly in the right calf.  The patient is unable to identify any specific triggers for the pain, but notes that she is often on her feet. She has not noticed any correlation between walking and the onset of pain, as she has not been walking much recently. The patient denies any numbness or tingling associated with the pain, and reports no relief with rubbing or massaging the legs. However, she did find some relief with the use of Goody powder, an over-the-counter pain reliever containing aspirin and acetaminophen.  The patient has a history of TIA and is currently on a daily regimen of baby aspirin. She denies any recent falls or injuries, and reports no other symptoms or issues that may be related to the leg pain. The patient also denies any history of low back problems, and reports no current low back pain. She does report occasional mid-back pain when standing for prolonged periods, such as when cooking.  The patient's leg pain is not associated with any tenderness in the calf, and she reports no pain on palpation. The pain is described as a sensation of 'springs' moving within the legs, and does not appear to be inflammatory in nature. The patient denies any other symptoms or issues that may be related to the leg pain.       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  Problem list overviews that were updated at today's visit: Problem  Leg Pain, Bilateral   Current Outpatient Medications on File Prior to Visit  Medication Sig   ALPRAZolam (XANAX) 0.25 MG tablet TAKE 1 TABLET TWICE DAILY AS NEEDED FOR ANXIETY. DO NOT DRIVE FOR 8 HOURS AFTER TAKING   aspirin 81 MG EC tablet Take 1 tablet (81 mg total) by mouth daily. Swallow whole.   Aspirin-Acetaminophen-Caffeine (GOODY HEADACHE PO) Take 1 packet by mouth daily as needed (headache).   busPIRone (BUSPAR) 5 MG tablet TAKE 1 TABLET THREE TIMES DAILY   Cholecalciferol (VITAMIN D3 PO) Take 1 capsule by mouth 2 (two) times daily.   ezetimibe (ZETIA) 10 MG tablet TAKE 1 TABLET EVERY DAY   levothyroxine (SYNTHROID) 75 MCG tablet TAKE 1 TABLET EVERY DAY   LINZESS 72 MCG capsule TAKE 1 CAPSULE EVERY DAY BEFORE BREAKFAST   MAGNESIUM PO Take 1 tablet by mouth daily.   omeprazole (PRILOSEC) 40 MG capsule TAKE 1 CAPSULE EVERY DAY   valsartan (DIOVAN) 80 MG tablet TAKE 1 TABLET TWICE DAILY   vitamin B-12 (CYANOCOBALAMIN) 1000 MCG tablet Take 1,000 mcg by  mouth daily.   amLODipine (NORVASC) 2.5 MG tablet TAKE 1 TABLET EVERY DAY (Patient not taking: Reported on 06/25/2023)   No current facility-administered medications on file prior to visit.  There are no discontinued medications.   Objective   Physical Exam  BP (!) 176/80 (BP Location: Left Arm, Patient Position: Sitting)   Pulse 74   Temp 98.4 F (36.9 C) (Temporal)   Ht 5\' 4"  (1.626 m)   Wt 128 lb 12.8 oz (58.4 kg)   LMP  (LMP Unknown)   SpO2 97%   BMI 22.11 kg/m  Wt Readings from Last 10 Encounters:  06/25/23 128 lb 12.8 oz (58.4 kg)  02/20/23 129 lb (58.5 kg)  01/20/23 129 lb (58.5 kg)  11/05/22 130 lb (59 kg)  10/30/22 128 lb 9.6 oz (58.3 kg)  07/19/22 128 lb (58.1 kg)  06/07/22  128 lb 12.8 oz (58.4 kg)  04/12/22 130 lb 6.4 oz (59.1 kg)  04/03/22 131 lb (59.4 kg)  02/14/22 131 lb 3.2 oz (59.5 kg)   Vital signs reviewed.  Nursing notes reviewed. Weight trend reviewed. Abnormalities and Problem-Specific physical exam findings:  negative straight leg, 2+pulses bilateral patient, warm, varicosities in legs, no tenderness to palpation, negative straight leg, walked 500 feet briskly without inducing the pain... well maybe a little... she calls it a spring sensation, that hurts a lot at times but wasn't too bad during our appointment.  General Appearance:  No acute distress appreciable.   Well-groomed, healthy-appearing female.  Well proportioned with no abnormal fat distribution.  Good muscle tone. Pulmonary:  Normal work of breathing at rest, no respiratory distress apparent. SpO2: 97 %  Musculoskeletal: All extremities are intact.  Neurological:  Awake, alert, oriented, and engaged.  No obvious focal neurological deficits or cognitive impairments.  Sensorium seems unclouded.   Speech is clear and coherent with logical content. Psychiatric:  Appropriate mood, pleasant and cooperative demeanor, thoughtful and engaged during the exam  Results            No results found for any visits on 06/25/23.  Office Visit on 01/20/2023  Component Date Value   Sodium 01/20/2023 137    Potassium 01/20/2023 4.3    Chloride 01/20/2023 101    CO2 01/20/2023 29    Glucose, Bld 01/20/2023 88    BUN 01/20/2023 22    Creatinine, Ser 01/20/2023 0.94    Total Bilirubin 01/20/2023 0.4    Alkaline Phosphatase 01/20/2023 56    AST 01/20/2023 23    ALT 01/20/2023 14    Total Protein 01/20/2023 6.7    Albumin 01/20/2023 4.0    GFR 01/20/2023 56.18 (L)    Calcium 01/20/2023 9.1    WBC 01/20/2023 6.1    RBC 01/20/2023 4.17    Hemoglobin 01/20/2023 12.9    HCT 01/20/2023 38.1    MCV 01/20/2023 91.5    MCHC 01/20/2023 33.8    RDW 01/20/2023 13.2    Platelets 01/20/2023 237.0     Neutrophils Relative % 01/20/2023 58.9    Lymphocytes Relative 01/20/2023 28.1    Monocytes Relative 01/20/2023 8.2    Eosinophils Relative 01/20/2023 3.7    Basophils Relative 01/20/2023 1.1    Neutro Abs 01/20/2023 3.6    Lymphs Abs 01/20/2023 1.7    Monocytes Absolute 01/20/2023 0.5    Eosinophils Absolute 01/20/2023 0.2    Basophils Absolute 01/20/2023 0.1    TSH 01/20/2023 1.14   Admission on 10/29/2022, Discharged on 10/30/2022  Component Date Value   Sodium  10/29/2022 131 (L)    Potassium 10/29/2022 4.1    Chloride 10/29/2022 98    CO2 10/29/2022 26    Glucose, Bld 10/29/2022 104 (H)    BUN 10/29/2022 17    Creatinine, Ser 10/29/2022 0.93    Calcium 10/29/2022 8.6 (L)    GFR, Estimated 10/29/2022 >60    Anion gap 10/29/2022 7    WBC 10/29/2022 7.4    RBC 10/29/2022 4.04    Hemoglobin 10/29/2022 12.2    HCT 10/29/2022 37.1    MCV 10/29/2022 91.8    MCH 10/29/2022 30.2    MCHC 10/29/2022 32.9    RDW 10/29/2022 12.7    Platelets 10/29/2022 191    nRBC 10/29/2022 0.0    Troponin I (High Sensiti* 10/29/2022 3    SARS Coronavirus 2 by RT* 10/29/2022 NEGATIVE    Influenza A by PCR 10/29/2022 NEGATIVE    Influenza B by PCR 10/29/2022 NEGATIVE    Resp Syncytial Virus by * 10/29/2022 NEGATIVE    Color, Urine 10/29/2022 STRAW (A)    APPearance 10/29/2022 CLEAR    Specific Gravity, Urine 10/29/2022 1.008    pH 10/29/2022 7.0    Glucose, UA 10/29/2022 NEGATIVE    Hgb urine dipstick 10/29/2022 NEGATIVE    Bilirubin Urine 10/29/2022 NEGATIVE    Ketones, ur 10/29/2022 NEGATIVE    Protein, ur 10/29/2022 NEGATIVE    Nitrite 10/29/2022 NEGATIVE    Leukocytes,Ua 10/29/2022 SMALL (A)    RBC / HPF 10/29/2022 0-5    WBC, UA 10/29/2022 0-5    Bacteria, UA 10/29/2022 NONE SEEN    Squamous Epithelial / HPF 10/29/2022 0-5    Troponin I (High Sensiti* 10/29/2022 3    Sodium 10/30/2022 134 (L)    Potassium 10/30/2022 4.2    Chloride 10/30/2022 103    CO2 10/30/2022 26    Glucose,  Bld 10/30/2022 94    BUN 10/30/2022 14    Creatinine, Ser 10/30/2022 0.95    Calcium 10/30/2022 9.1    GFR, Estimated 10/30/2022 59 (L)    Anion gap 10/30/2022 5    Weight 10/30/2022 2,057.6    Height 10/30/2022 64    BP 10/30/2022 145/79    Single Plane A2C EF 10/30/2022 67.1    Single Plane A4C EF 10/30/2022 63.1    Calc EF 10/30/2022 66.4    S' Lateral 10/30/2022 1.70    Area-P 1/2 10/30/2022 3.15    Est EF 10/30/2022 60 - 65%   Admission on 07/19/2022, Discharged on 07/19/2022  Component Date Value   Sodium 07/19/2022 139    Potassium 07/19/2022 3.6    Chloride 07/19/2022 106    CO2 07/19/2022 27    Glucose, Bld 07/19/2022 76    BUN 07/19/2022 17    Creatinine, Ser 07/19/2022 1.00    Calcium 07/19/2022 9.0    Total Protein 07/19/2022 6.2 (L)    Albumin 07/19/2022 3.6    AST 07/19/2022 21    ALT 07/19/2022 12    Alkaline Phosphatase 07/19/2022 62    Total Bilirubin 07/19/2022 0.7    GFR, Estimated 07/19/2022 56 (L)    Anion gap 07/19/2022 6    WBC 07/19/2022 5.9    RBC 07/19/2022 4.31    Hemoglobin 07/19/2022 12.8    HCT 07/19/2022 40.0    MCV 07/19/2022 92.8    MCH 07/19/2022 29.7    MCHC 07/19/2022 32.0    RDW 07/19/2022 12.4    Platelets 07/19/2022 225    nRBC 07/19/2022 0.0  ABO/RH(D) 07/19/2022 B POS    Antibody Screen 07/19/2022 NEG    Sample Expiration 07/19/2022                     Value:07/22/2022,2359 Performed at [facility], [address] [place], [place] [address]    Fecal Occult Bld 07/19/2022 NEGATIVE   Lab on 07/17/2022  Component Date Value   TSH 07/17/2022 0.76    No image results found.   No results found.  VAS US CAROTID  Result Date: 02/28/2023 Carotid Arterial Duplex Study Patient Name:  YVELISSE CLOUTHIER  Date of Exam:   02/27/2023 Medical Rec #: [id number]      Accession #:    [id number] Date of Birth: June 04, 1941      Patient Gender: F Patient Age:   60 years Exam Location:  [facility] Procedure:      VAS US CAROTID Referring  Phys: Joni Reining --------------------------------------------------------------------------------  Indications:  Patient reports near syncope and feeling faint with associated               weakness and her left arm feeling heavy. Risk Factors: Hypertension, hyperlipidemia, past history of smoking. Performing Technologist: Olegario Shearer RVT  Examination Guidelines: A complete evaluation includes B-mode imaging, spectral Doppler, color Doppler, and power Doppler as needed of all accessible portions of each vessel. Bilateral testing is considered an integral part of a complete examination. Limited examinations for reoccurring indications may be performed as noted.  Right Carotid Findings: +----------+--------+--------+--------+------------------+--------+           PSV cm/sEDV cm/sStenosisPlaque DescriptionComments +----------+--------+--------+--------+------------------+--------+ CCA Prox  75      9                                          +----------+--------+--------+--------+------------------+--------+ CCA Distal60      13                                         +----------+--------+--------+--------+------------------+--------+ ICA Prox  52      15              heterogenous               +----------+--------+--------+--------+------------------+--------+ ICA Mid   88      29      1-39%                              +----------+--------+--------+--------+------------------+--------+ ICA Distal96      25                                         +----------+--------+--------+--------+------------------+--------+ ECA       151     8                                          +----------+--------+--------+--------+------------------+--------+ +----------+--------+-------+----------------+-------------------+           PSV cm/sEDV cmsDescribe        Arm Pressure (mmHG) +----------+--------+-------+----------------+-------------------+ [id number]  Multiphasic, [id number]                 +----------+--------+-------+----------------+-------------------+ +---------+--------+--+--------+-+ VertebralPSV cm/s60EDV cm/s6 +---------+--------+--+--------+-+  Left Carotid Findings: +----------+--------+--------+--------+------------------+--------+           PSV cm/sEDV cm/sStenosisPlaque DescriptionComments +----------+--------+--------+--------+------------------+--------+ CCA Prox  86      9                                          +----------+--------+--------+--------+------------------+--------+ CCA Distal92      8                                          +----------+--------+--------+--------+------------------+--------+ ICA Prox  75      17      1-39%   smooth                     +----------+--------+--------+--------+------------------+--------+ ICA Mid   59      20                                         +----------+--------+--------+--------+------------------+--------+ ICA Distal36      10                                         +----------+--------+--------+--------+------------------+--------+ ECA       76      19                                         +----------+--------+--------+--------+------------------+--------+ +----------+--------+--------+----------------+-------------------+           PSV cm/sEDV cm/sDescribe        Arm Pressure (mmHG) +----------+--------+--------+----------------+-------------------+ [id number]             Multiphasic, [id number]                 +----------+--------+--------+----------------+-------------------+ +---------+--------+--+--------+--+---------+ VertebralPSV cm/s45EDV cm/s13Antegrade +---------+--------+--+--------+--+---------+   Summary: Right Carotid: Velocities in the right ICA are consistent with a 1-39% stenosis. Left Carotid: Velocities in the left ICA are consistent with a 1-39% stenosis. Vertebrals:  Bilateral vertebral arteries  demonstrate antegrade flow. Subclavians: Normal flow hemodynamics were seen in bilateral subclavian              arteries. *See table(s) above for measurements and observations.  Electronically signed by Lorine Bears MD on 02/28/2023 at 1:12:52 PM.    Final        Additional Info: This encounter employed real-time, collaborative documentation. The patient actively reviewed and updated their medical record on a shared screen, ensuring transparency and facilitating joint problem-solving for the problem list, overview, and plan. This approach promotes accurate, informed care. The treatment plan was discussed and reviewed in detail, including medication safety, potential side effects, and all patient questions. We confirmed understanding and comfort with the plan. Follow-up instructions were established, including contacting the office for any concerns, returning if symptoms worsen, persist, or new symptoms develop, and precautions for potential emergency department visits.

## 2023-06-25 NOTE — Assessment & Plan Note (Signed)
She presents with bilateral leg pain, more severe in the right leg, extending from the calf to mid-thigh on the right and localized to the calf on the left. The pain, described as a "spring-like" sensation, is not associated with numbness or tingling and does not worsen with a straight leg raise test, indicating it is unlikely related to a back issue. Relief from Goody powder, which contains aspirin, suggests a possible vascular cause, yet normal foot pulses complicate the diagnosis. We will order an Ankle-Brachial Index (ABI) to evaluate for potential peripheral arterial disease and increase the aspirin dose to 325mg  daily to improve blood flow. A follow-up appointment is scheduled in 1 week to review test results and reassess symptoms.

## 2023-06-26 ENCOUNTER — Telehealth: Payer: Self-pay | Admitting: Family Medicine

## 2023-06-26 NOTE — Telephone Encounter (Signed)
Patient called stating she is scheduled @ [address] [facility] for VAS [place] ABI WITH/WO TBI  but she isn't sure where this is. I offered her the address but she states she drives herself and would prefer to go to a facility that she has knowledge about. Can VAS [place] ABI WITH/WO TBI  be completed at [facility] on [address]? Please Advise.

## 2023-06-27 NOTE — Telephone Encounter (Signed)
Informed patient that this type of imaging cannot be done at [facility] (confirmed with them) and provided her with the address and contact information for [facility].

## 2023-07-02 ENCOUNTER — Ambulatory Visit: Payer: Medicare HMO | Admitting: Internal Medicine

## 2023-07-09 ENCOUNTER — Encounter (HOSPITAL_BASED_OUTPATIENT_CLINIC_OR_DEPARTMENT_OTHER): Payer: Medicare HMO

## 2023-07-28 ENCOUNTER — Ambulatory Visit: Payer: Medicare HMO | Admitting: Family Medicine

## 2023-08-08 ENCOUNTER — Ambulatory Visit (INDEPENDENT_AMBULATORY_CARE_PROVIDER_SITE_OTHER): Payer: Medicare HMO

## 2023-08-08 DIAGNOSIS — Z23 Encounter for immunization: Secondary | ICD-10-CM

## 2023-08-12 ENCOUNTER — Telehealth: Payer: Self-pay | Admitting: Family Medicine

## 2023-08-12 NOTE — Telephone Encounter (Signed)
Pt states her BP is still not controlled, she would like a call back. She declined an appt. Please advise.

## 2023-08-12 NOTE — Telephone Encounter (Signed)
I agree these are high-if she making sure to elevate her arm to heart level?  Her feet uncrossed and back up against a chair  We need to be cautious about lowering her too much.  Please confirm her current doses of medicine and provide the above information as well-I believe she is on amlodipine and an angiotensin receptor blocker but confirm doses for me please  Thankfully she has no symptoms-if develops symptoms concerning seek care

## 2023-08-12 NOTE — Telephone Encounter (Signed)
Called and spoke with pt and bp has been running:  08/12/23: 169/87 am 08/10/23: 130/68 am 08/06/23: 153/88 am 08/04/23 : 155/77 am 08/02/23 : 158/87 am  Pt denies any symptoms.

## 2023-08-13 MED ORDER — VALSARTAN 160 MG PO TABS: 160.0000 mg | ORAL_TABLET | Freq: Two times a day (BID) | ORAL | 0 refills | Status: DC

## 2023-08-13 NOTE — Telephone Encounter (Signed)
Called patient with the comments from Dr. Durene Cal. Patient stated that she is doing all of what she is suppose to do as to taking her BP at home. She also stated that she previously spoke with his nurse about the Amlodipine hurting her feet and having leg swelling in return that she was told per Dr. Durene Cal to hold the amlodipine monitor BP and if SBP is consistently greater than 140 to give office a call. Informed patient that I will pass this information along to Dr. Durene Cal and that I will call her back with his recommendations.

## 2023-08-13 NOTE — Telephone Encounter (Signed)
Per conversation with Dr. Durene Cal remove Amlodipine from patients medication list, increase Valsartan to 160 mg BID and to follow up with him in a month.

## 2023-08-13 NOTE — Telephone Encounter (Signed)
Called patient and made her aware of Dr. Erasmo Leventhal recommendations. Discontinued Amlodipine, sent new Rx for Valsartan 160 mg BID to patient's local pharmacy and scheduled patient for a 1 month follow up with Dr. Durene Cal on 09/15/23 at 3:00 PM. Patient verbalized understanding and all (if any) questions were answered.

## 2023-08-25 ENCOUNTER — Telehealth (HOSPITAL_COMMUNITY): Payer: Self-pay

## 2023-08-25 NOTE — Telephone Encounter (Signed)
I was contacted by Albin Felling with [facility] to assist with scheduling.  I called the patient, patient answered the phone, after short conversation about the request. She stated "I am feeling better and do not want this study", in simple language I clarified with the patient that they want the order canceled not deferred.  Patient expressed well wishes for the upcoming holiday and was pleasant throughout the call.  I followed up with Albin Felling so that she could document where needed.

## 2023-09-12 ENCOUNTER — Other Ambulatory Visit: Payer: Self-pay | Admitting: Gastroenterology

## 2023-09-15 ENCOUNTER — Ambulatory Visit: Payer: Medicare HMO | Admitting: Family Medicine

## 2023-09-16 NOTE — Telephone Encounter (Unsigned)
 Copied from CRM [id number]. Topic: Clinical - Prescription Issue >> Sep 16, 2023  3:17 PM Macario HERO wrote: Reason for CRM: Patient stated [employer] advised her we need to send them a new prescription for her ALPRAZolam  (XANAX ) 0.25 MG tablet [[id number]] - inquired if it was a prior authorization needed and patient said a prescription.

## 2023-10-01 DIAGNOSIS — H25813 Combined forms of age-related cataract, bilateral: Secondary | ICD-10-CM | POA: Diagnosis not present

## 2023-10-01 DIAGNOSIS — H524 Presbyopia: Secondary | ICD-10-CM | POA: Diagnosis not present

## 2023-10-02 ENCOUNTER — Other Ambulatory Visit: Payer: Self-pay | Admitting: Family

## 2023-10-08 ENCOUNTER — Other Ambulatory Visit: Payer: Self-pay

## 2023-10-08 ENCOUNTER — Telehealth: Payer: Self-pay

## 2023-10-08 MED ORDER — ALPRAZOLAM 0.25 MG PO TABS: 0.2500 mg | ORAL_TABLET | Freq: Two times a day (BID) | ORAL | 0 refills | Status: DC | PRN

## 2023-10-08 NOTE — Telephone Encounter (Signed)
 See prior crm documentation.  Copied from CRM [id number]. Topic: General - Other >> Oct 08, 2023 10:34 AM Marlan Silva wrote: Reason for CRM: Patient called in requesting that Dr. Elverna Hamman nurse give her a call back. Patient would not disclose any information.

## 2023-10-08 NOTE — Telephone Encounter (Signed)
 Return pt call and she was requesting refill on her Xanax  since she has been out of it for 2 months. Refill approval sent to Dr. Katrinka.  Copied from CRM [id number]. Topic: General - Other >> Oct 08, 2023 10:34 AM Mercedes MATSU wrote: Reason for CRM: Patient called in requesting that Dr. San nurse give her a call back. Patient would not disclose any information.

## 2023-10-08 NOTE — Progress Notes (Signed)
 Appears may be establishing with a new provider but I did provide one-time refill

## 2023-11-05 ENCOUNTER — Ambulatory Visit: Payer: Medicare HMO | Admitting: Family Medicine

## 2023-11-18 ENCOUNTER — Telehealth: Payer: Self-pay | Admitting: *Deleted

## 2023-11-18 NOTE — Telephone Encounter (Signed)
 Contacted pt and informed her that we would not be able to send any medication in until she has her est care appt.

## 2023-11-18 NOTE — Telephone Encounter (Signed)
 Copied from CRM [id number]. Topic: Clinical - Medication Question >> Nov 18, 2023  2:42 PM Gery Pray wrote: Reason for CRM: Patient calling to have a list of her prescriptions sent over to the pharmacy. Pharmacy is [facility]: [phone] Fax: [phone]. Patient is to receive a 90 day supply of medication.

## 2023-11-20 ENCOUNTER — Encounter: Payer: Medicare HMO | Admitting: Family Medicine

## 2023-11-27 ENCOUNTER — Ambulatory Visit (INDEPENDENT_AMBULATORY_CARE_PROVIDER_SITE_OTHER): Payer: Medicare HMO | Admitting: Family Medicine

## 2023-11-27 ENCOUNTER — Encounter: Payer: Self-pay | Admitting: Family Medicine

## 2023-11-27 VITALS — BP 150/90 | HR 63 | Ht 64.0 in | Wt 126.0 lb

## 2023-11-27 DIAGNOSIS — E039 Hypothyroidism, unspecified: Secondary | ICD-10-CM | POA: Diagnosis not present

## 2023-11-27 DIAGNOSIS — R6889 Other general symptoms and signs: Secondary | ICD-10-CM | POA: Insufficient documentation

## 2023-11-27 DIAGNOSIS — I10 Essential (primary) hypertension: Secondary | ICD-10-CM

## 2023-11-27 DIAGNOSIS — E7800 Pure hypercholesterolemia, unspecified: Secondary | ICD-10-CM | POA: Diagnosis not present

## 2023-11-27 DIAGNOSIS — I739 Peripheral vascular disease, unspecified: Secondary | ICD-10-CM | POA: Diagnosis not present

## 2023-11-27 DIAGNOSIS — Z7689 Persons encountering health services in other specified circumstances: Secondary | ICD-10-CM

## 2023-11-27 NOTE — Assessment & Plan Note (Signed)
 Patient complains of increasing forgetfulness.  No issues with getting lost, misplacing things or repeating herself.  Declines neurocognitive testing at this time.  Consider reevaluating this in the future if her symptoms worsen.  On exam she does not have any concerning findings with memory.

## 2023-11-27 NOTE — Assessment & Plan Note (Signed)
 Blood pressure elevated today.  Initially significantly elevated.  On manual repeat was closer to 150/90.  Patient states they are better at home.  Advised her to check her blood pressure at home and bring them back to [place] at her next visit in 1 month.  Currently taking 160 mg valsartan twice daily.

## 2023-11-27 NOTE — Patient Instructions (Signed)
 It was nice to see you today,  We addressed the following topics today: -Your blood pressure was still a little bit elevated so I would like you to record your readings at home and then bring them back to [place] at your next visit. - I would like to see you back in 1 month for your physical. - Before that we will check your lab test. - They over-the-counter medications like Prevagen for memory loss have not been shown to be effective.  I would recommend taking a multivitamin just to make sure you are not deficient in anything, but the most important things are keeping your blood pressure, your cholesterol, and your blood sugar under control.  It is also important to remain social and if you have any issues with hearing loss get those checked out.  Have a great day,  Frederic Jericho, MD

## 2023-11-27 NOTE — Progress Notes (Signed)
 New Patient Office Visit  Subjective   Patient ID: [name], female    DOB: Mar 18, 1941  Age: 83 y.o. MRN: [id number]  CC:  Chief Complaint  Patient presents with   New Patient (Initial Visit)    HPI [name] presents to establish care  Patient has concerns about memory and forgetfulness.  States she has trouble remembering names and locations.  Does not get lost when driving or going out.  Does not misplace things.  Does not repeat herself.  We talked about testing for this.  She declines cognitive testing at this time.  She asked about over-the-counter Prevagen.  We discussed other ways to help stave off memory issues.  Patient's blood pressure is elevated today.  States it is normally much better at home.  She has a blood pressure cuff at home.  She is agreeable to checking at home and return those values to [place] at her next visit.  PMH: GAD, GERD, HTN, TIA, hypothyroid, PAD  PSH: Right lumpectomy, appendectomy, hysterectomy  FH: Colon cancer in mother brother and son.  Hodgkin's lymphoma in brother.  Tobacco use: No Alcohol use: No Drug use: No Marital status: Married Employment: Retired   Screenings:  Colon Cancer: Aged out Lung Cancer: Not applicable Breast Cancer: Aged out   Outpatient Encounter Medications as of 11/27/2023  Medication Sig   ALPRAZolam (XANAX) 0.25 MG tablet Take 1 tablet (0.25 mg total) by mouth 2 (two) times daily as needed for anxiety.   aspirin 81 MG EC tablet Take 1 tablet (81 mg total) by mouth daily. Swallow whole.   Aspirin-Acetaminophen-Caffeine (GOODY HEADACHE PO) Take 1 packet by mouth daily as needed (headache).   busPIRone (BUSPAR) 5 MG tablet TAKE 1 TABLET THREE TIMES DAILY   Cholecalciferol (VITAMIN D3 PO) Take 1 capsule by mouth 2 (two) times daily.   ezetimibe (ZETIA) 10 MG tablet TAKE 1 TABLET EVERY DAY   levothyroxine (SYNTHROID) 75 MCG tablet TAKE 1 TABLET EVERY DAY   MAGNESIUM PO Take 1 tablet by mouth daily.    omeprazole (PRILOSEC) 40 MG capsule TAKE 1 CAPSULE EVERY DAY   valsartan (DIOVAN) 160 MG tablet Take 1 tablet (160 mg total) by mouth 2 (two) times daily.   vitamin B-12 (CYANOCOBALAMIN) 1000 MCG tablet Take 1,000 mcg by mouth daily.   [DISCONTINUED] LINZESS 72 MCG capsule TAKE 1 CAPSULE EVERY DAY BEFORE BREAKFAST   No facility-administered encounter medications on file as of 11/27/2023.    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, bilateral    SVD (spontaneous vaginal delivery)    x 4   Thyroid disease     Past Surgical History:  Procedure Laterality Date   ABDOMINAL HYSTERECTOMY     APPENDECTOMY     BREAST SURGERY Right    LUMPECTOMY- FIBROID TUMOR benign   COLONOSCOPY     hx polyps/ [facility]-mother    ENTEROCELE REPAIR     EXCISIONAL HEMORRHOIDECTOMY     fnger surgery  04/2018   POLYPECTOMY      Family History  Problem Relation Age of Onset   Colon cancer Mother 43   Anxiety disorder Mother    Deep vein thrombosis Mother        died age 92   Heart disease Father 60  smoker, high stress job   Colon cancer Brother 50   Hodgkin's lymphoma Brother 27   Colon cancer Maternal Aunt 34   Colon cancer Paternal Grandmother    Breast cancer Daughter    Colon cancer Son 38   Colon cancer Other 32   Colon polyps Neg Hx    Esophageal cancer Neg Hx    Rectal cancer Neg Hx    Stomach cancer Neg Hx    Transient ischemic attack Neg Hx    Stroke Neg Hx     Social History   Socioeconomic History   Marital status: Married    Spouse name: Not on file   Number of children: 4   Years of education: Not on file   Highest education level: Not on file  Occupational History   Occupation: retired  Tobacco Use   Smoking status: Former    Current packs/day: 0.00    Average packs/day: 1 pack/day for 13.0 years (13.0 ttl pk-yrs)    Types: Cigarettes     Start date: 09/02/1965    Quit date: 09/02/1978    Years since quitting: 45.2   Smokeless tobacco: Never   Tobacco comments:    quit in 1980  Vaping Use   Vaping status: Never Used  Substance and Sexual Activity   Alcohol use: No    Alcohol/week: 0.0 standard drinks of alcohol   Drug use: No   Sexual activity: Not on file  Other Topics Concern   Not on file  Social History Narrative   Married 61 years in 2019. 4 children, 3 living. 5 grandkids.    Poodle 12.5 in 2019.       Housewife.    Some college- [employer]      Hobbies: travel, walking, work out in yard   Social Drivers of Corporate investment banker Strain: Not on file  Food Insecurity: No Food Insecurity (10/30/2022)   Hunger Vital Sign    Worried About Running Out of Food in the Last Year: Never true    Ran Out of Food in the Last Year: Never true  Transportation Needs: No Transportation Needs (10/30/2022)   PRAPARE - Administrator, Civil Service (Medical): No    Lack of Transportation (Non-Medical): No  Physical Activity: Not on file  Stress: Not on file  Social Connections: Not on file  Intimate Partner Violence: Not At Risk (10/30/2022)   Humiliation, Afraid, Rape, and Kick questionnaire    Fear of Current or Ex-Partner: No    Emotionally Abused: No    Physically Abused: No    Sexually Abused: No    ROS     Objective   BP (!) 150/90   Pulse 63   Ht 5\' 4"  (1.626 m)   Wt 126 lb (57.2 kg)   LMP  (LMP Unknown)   SpO2 98%   BMI 21.63 kg/m   Physical Exam General: Alert, oriented.  Accompanied by husband HEENT: PERRLA, EOMI, moist mucosa CV: Regular rate rhythm Pulmonary: Lungs clear bilaterally GI: Soft, normal bowel sounds MSK: Strength equal bilaterally. Psych: Pleasant affect Skin warm and dry     Assessment & Plan:   Encounter to establish care  Essential hypertension Assessment & Plan: Blood pressure elevated today.  Initially significantly elevated.  On manual repeat was  closer to 150/90.  Patient states they are better at home.  Advised her to check her blood pressure at home and bring them back to [place] at her next visit in  1 month.  Currently taking 160 mg valsartan twice daily.  Orders: -     Comprehensive metabolic panel with GFR -     CBC  Peripheral arterial disease (HCC) -     Lipid panel  Acquired hypothyroidism -     TSH  Hypercholesterolemia -     Hemoglobin A1c -     Lipid panel -     Comprehensive metabolic panel with GFR -     CBC  Forgetfulness Assessment & Plan: Patient complains of increasing forgetfulness.  No issues with getting lost, misplacing things or repeating herself.  Declines neurocognitive testing at this time.  Consider reevaluating this in the future if her symptoms worsen.  On exam she does not have any concerning findings with memory.      Return in about 4 weeks (around 12/25/2023) for physical.   Sandre Kitty, MD

## 2023-11-28 LAB — LIPID PANEL
Chol/HDL Ratio: 2.9 ratio (ref 0.0–4.4)
Cholesterol, Total: 189 mg/dL (ref 100–199)
HDL: 65 mg/dL (ref >39)
LDL Chol Calc (NIH): 104 mg/dL — ABNORMAL HIGH (ref 0–99)
Triglycerides: 111 mg/dL (ref 0–149)
VLDL Cholesterol Cal: 20 mg/dL (ref 5–40)

## 2023-11-28 LAB — CBC
Hematocrit: 41.5 % (ref 34.0–46.6)
Hemoglobin: 13.7 g/dL (ref 11.1–15.9)
MCH: 29.9 pg (ref 26.6–33.0)
MCHC: 33 g/dL (ref 31.5–35.7)
MCV: 91 fL (ref 79–97)
Platelets: 220 10*3/uL (ref 150–450)
RBC: 4.58 x10E6/uL (ref 3.77–5.28)
RDW: 12 % (ref 11.7–15.4)
WBC: 6.7 10*3/uL (ref 3.4–10.8)

## 2023-11-28 LAB — COMPREHENSIVE METABOLIC PANEL WITH GFR
ALT: 15 IU/L (ref 0–32)
AST: 25 IU/L (ref 0–40)
Albumin: 4.2 g/dL (ref 3.7–4.7)
Alkaline Phosphatase: 78 IU/L (ref 44–121)
BUN/Creatinine Ratio: 15 (ref 12–28)
BUN: 14 mg/dL (ref 8–27)
Bilirubin Total: 0.4 mg/dL (ref 0.0–1.2)
CO2: 25 mmol/L (ref 20–29)
Calcium: 9.4 mg/dL (ref 8.7–10.3)
Chloride: 98 mmol/L (ref 96–106)
Creatinine, Ser: 0.92 mg/dL (ref 0.57–1.00)
Globulin, Total: 2.3 g/dL (ref 1.5–4.5)
Glucose: 78 mg/dL (ref 70–99)
Potassium: 4.3 mmol/L (ref 3.5–5.2)
Sodium: 135 mmol/L (ref 134–144)
Total Protein: 6.5 g/dL (ref 6.0–8.5)
eGFR: 61 mL/min/{1.73_m2} (ref >59)

## 2023-11-28 LAB — HEMOGLOBIN A1C
Est. average glucose Bld gHb Est-mCnc: 117 mg/dL
Hgb A1c MFr Bld: 5.7 % — ABNORMAL HIGH (ref 4.8–5.6)

## 2023-11-28 LAB — TSH: TSH: 0.294 u[IU]/mL — ABNORMAL LOW (ref 0.450–4.500)

## 2023-12-03 ENCOUNTER — Telehealth: Payer: Self-pay

## 2023-12-03 NOTE — Telephone Encounter (Signed)
 Copied from CRM [id number]. Topic: Clinical - Request for Lab/Test Order >> Dec 03, 2023 11:47 AM [name] wrote: Reason for CRM: Patient asking why she needs labs drawn so soon, she is worried that her insurance wont pay for it, please call [phone]

## 2023-12-04 ENCOUNTER — Other Ambulatory Visit: Payer: Self-pay | Admitting: Family Medicine

## 2023-12-04 ENCOUNTER — Ambulatory Visit: Payer: Self-pay

## 2023-12-04 DIAGNOSIS — R030 Elevated blood-pressure reading, without diagnosis of hypertension: Secondary | ICD-10-CM | POA: Diagnosis not present

## 2023-12-04 MED ORDER — VALSARTAN-HYDROCHLOROTHIAZIDE 160-12.5 MG PO TABS: 1.0000 | ORAL_TABLET | Freq: Every day | ORAL | 1 refills | Status: DC

## 2023-12-04 NOTE — Telephone Encounter (Signed)
 Please let the patient know that I am sending in a medication that contains valsartan and hydrochlorothiazide.  It is the same dose of valsartan but it adds hydrochlorothiazide.  She will still take this twice a day like she is doing currently.  She will stop taking the medication that only contains valsartan and replace it with this 1.

## 2023-12-04 NOTE — Telephone Encounter (Signed)
 Pt informed of below.

## 2023-12-04 NOTE — Telephone Encounter (Signed)
 Contacted pt and informed her of below and she stated that her BP has been running high below are the dates and BP.  She said it is usually in the AM and is after she has taken her medication. Please advise what she should do.    3/23- 140/78 3/27-157/79 4/2-173/75 4/2-174/84 4/3-175/88

## 2023-12-04 NOTE — Telephone Encounter (Signed)
 Copied from CRM [id number]. Topic: Clinical - Red Word Triage >> Dec 04, 2023  8:27 AM Franchot Heidelberg wrote: Red Word that prompted transfer to Nurse Triage: 174/84 BP yesterday  Chief Complaint: elevated bp 174/84 Symptoms: headache Frequency: comes and goes Pertinent Negatives: Patient denies fever, cp, sob, numbness/tingling, weakness Disposition: [] ED /[] Urgent Care (no appt availability in office) / [] Appointment(In office/virtual)/ []  [facility]/ [x] Home Care/ [] Refused Recommended Disposition /[]  Mobile Bus/ []  Follow-up with PCP Additional Notes: no apt available until may.  Instructed to go to UC; care advice given, denies questions; instructed to go to ER if becomes worse.  Patient would like to be put on waiting list for visit and would like something called in for her elevated bp.   Reason for Disposition  Systolic BP  >= 160 OR Diastolic >= 100  Answer Assessment - Initial Assessment Questions 1. BLOOD PRESSURE: "What is the blood pressure?" "Did you take at least two measurements 5 minutes apart?"     174/84 yesterday 2. ONSET: "When did you take your blood pressure?"     yesterday 3. HOW: "How did you take your blood pressure?" (e.g., automatic home BP monitor, visiting nurse)     automatic 4. HISTORY: "Do you have a history of high blood pressure?"     yes 5. MEDICINES: "Are you taking any medicines for blood pressure?" "Have you missed any doses recently?"     See med list 6. OTHER SYMPTOMS: "Do you have any symptoms?" (e.g., blurred vision, chest pain, difficulty breathing, headache, weakness)     Headache, 7. PREGNANCY: "Is there any chance you are pregnant?" "When was your last menstrual period?"     na  Protocols used: Blood Pressure - High-A-AH

## 2023-12-04 NOTE — Telephone Encounter (Signed)
 please let her know that we can hold off on any more testing until I speak to her again at the physical next month.

## 2023-12-08 ENCOUNTER — Other Ambulatory Visit: Payer: Self-pay | Admitting: Family Medicine

## 2023-12-08 ENCOUNTER — Other Ambulatory Visit: Payer: Self-pay | Admitting: *Deleted

## 2023-12-08 DIAGNOSIS — E039 Hypothyroidism, unspecified: Secondary | ICD-10-CM

## 2023-12-08 MED ORDER — LEVOTHYROXINE SODIUM 75 MCG PO TABS: ORAL_TABLET | ORAL | 3 refills | Status: AC

## 2023-12-08 MED ORDER — BUSPIRONE HCL 5 MG PO TABS: 5.0000 mg | ORAL_TABLET | Freq: Three times a day (TID) | ORAL | 3 refills | Status: AC

## 2023-12-08 MED ORDER — ALPRAZOLAM 0.25 MG PO TABS: 0.2500 mg | ORAL_TABLET | Freq: Two times a day (BID) | ORAL | 0 refills | Status: DC | PRN

## 2023-12-08 MED ORDER — OMEPRAZOLE 40 MG PO CPDR: 40.0000 mg | DELAYED_RELEASE_CAPSULE | Freq: Every day | ORAL | 3 refills | Status: AC

## 2023-12-08 NOTE — Telephone Encounter (Signed)
 Copied from CRM [id number]. Topic: Clinical - Medication Refill >> Dec 08, 2023 12:48 PM Shelah Lewandowsky wrote: Most Recent Primary Care Visit:  Provider: Sandre Kitty  Department: [facility]  Visit Type: NEW PT - OFFICE VISIT  Date: 11/27/2023  Medication: ALPRAZolam (XANAX) 0.25 MG tablet   Has the patient contacted their pharmacy? No (Agent: If no, request that the patient contact the pharmacy for the refill. If patient does not wish to contact the pharmacy document the reason why and proceed with request.) (Agent: If yes, when and what did the pharmacy advise?)  Is this the correct pharmacy for this prescription? Yes If no, delete pharmacy and type the correct one.  This is the patient's preferred pharmacy:  [facility] [address], [place] - [address] [facility] [place] [address] Phone: [phone] Fax: [phone]   Has the prescription been filled recently? Yes  Is the patient out of the medication? No  Has the patient been seen for an appointment in the last year OR does the patient have an upcoming appointment? Yes  Can we respond through MyChart? No  Agent: Please be advised that Rx refills may take up to 3 business days. We ask that you follow-up with your pharmacy.

## 2023-12-10 ENCOUNTER — Telehealth: Payer: Self-pay | Admitting: *Deleted

## 2023-12-10 NOTE — Telephone Encounter (Signed)
 Copied from CRM [id number]. Topic: Clinical - Medication Question >> Dec 10, 2023  4:17 PM Fredrich Romans wrote: Reason for CRM: [facility] stating that patient is receiving brand name of  Mylan  for levothyroxine (SYNTHROID) 75 MCG tablet,they contacted patient and she stated that it is okay for the change to Amneal. Pharmacy needs permission from provider for this manufacturer change. Contact#: [phone] Fax number:[phone]

## 2023-12-10 NOTE — Telephone Encounter (Signed)
 Is this change ok? Please advise and I will contact the pharmacy.

## 2023-12-11 NOTE — Telephone Encounter (Signed)
 Yes is okay to switch

## 2023-12-11 NOTE — Telephone Encounter (Signed)
 Contacted pharmacy and informed them of below.

## 2024-01-06 ENCOUNTER — Other Ambulatory Visit

## 2024-01-13 ENCOUNTER — Encounter: Admitting: Family Medicine

## 2024-01-27 ENCOUNTER — Telehealth: Payer: Self-pay | Admitting: *Deleted

## 2024-01-27 NOTE — Telephone Encounter (Unsigned)
 Copied from CRM [id number]. Topic: Clinical - Medication Question >> Jan 27, 2024 10:24 AM Bambi Bonine D wrote: Reason for CRM: Pt stated that she needs a PA for the ALPRAZolam  (XANAX ) 0.25 MG tablet sent to [facility]. Pt stated that [facility] has been sending paperwork regarding a 90 day supply for the medication and needs to have it signed and sent back.

## 2024-01-29 ENCOUNTER — Encounter: Payer: Self-pay | Admitting: Family Medicine

## 2024-01-29 ENCOUNTER — Other Ambulatory Visit: Payer: Self-pay | Admitting: Family Medicine

## 2024-01-29 ENCOUNTER — Ambulatory Visit (INDEPENDENT_AMBULATORY_CARE_PROVIDER_SITE_OTHER): Admitting: Family Medicine

## 2024-01-29 VITALS — BP 151/89 | HR 84 | Ht 64.0 in | Wt 126.0 lb

## 2024-01-29 DIAGNOSIS — Z0000 Encounter for general adult medical examination without abnormal findings: Secondary | ICD-10-CM | POA: Diagnosis not present

## 2024-01-29 DIAGNOSIS — S81801A Unspecified open wound, right lower leg, initial encounter: Secondary | ICD-10-CM

## 2024-01-29 DIAGNOSIS — I10 Essential (primary) hypertension: Secondary | ICD-10-CM | POA: Diagnosis not present

## 2024-01-29 MED ORDER — ALPRAZOLAM 0.25 MG PO TABS: 0.2500 mg | ORAL_TABLET | Freq: Two times a day (BID) | ORAL | 0 refills | Status: DC | PRN

## 2024-01-29 NOTE — Telephone Encounter (Signed)
 Ok, pt has provider paperwork 01/29/2024

## 2024-01-29 NOTE — Assessment & Plan Note (Signed)
 TSH mildly low.  Will recheck in the next few weeks to confirm and if still low we can adjust her thyroid  medication.

## 2024-01-29 NOTE — Progress Notes (Unsigned)
   Annual physical  Subjective    Patient ID: [name], female    DOB: 1941/02/20  Age: 83 y.o. MRN: [id number]  Chief Complaint  Patient presents with   Annual Exam   HPI [name] is a 83 y.o. old female here  for annual exam.   subjective - Presents for physical exam - Reports increased stress due to family issues - Daughter and son-in-law both ill - Son-in-law fell from truck, sustained head laceration and 7 broken ribs, healing at home for 6 weeks - Daughter recently completed radiation therapy, currently hospitalized possibly due to medication issues  Medications: Valsartan /HCTZ 160/5 mg once daily (previously on Valsartan  160 mg twice daily), Zetia  10 mg daily, Alprazolam , Buspar , Synthroid , Omeprazole   PMH: Hypertension, Hyperlipidemia, Hypothyroidism, Anxiety  ROS: Denies blood pressure symptoms  The ASCVD Risk score (Arnett DK, et al., 2019) failed to calculate for the following reasons:   The 2019 ASCVD risk score is only valid for ages 72 to 31  Health Maintenance Due  Topic Date Due   Medicare Annual Wellness (AWV)  Never done   COVID-19 Vaccine (4 - 2024-25 season) 05/04/2023      Objective:     BP (!) 151/89   Pulse 84   Ht 5\' 4"  (1.626 m)   Wt 126 lb 0.6 oz (57.2 kg)   LMP  (LMP Unknown)   SpO2 96%   BMI 21.63 kg/m  {Vitals History (Optional):23777}  Physical Exam Gen: alert, oriented Heent: perrla, eomi,  Cv: rrr Pulm: lctab Gi: soft, nontender Skin: v-shaped laceration on the right shin. No evidence of infection.    No results found for any visits on 01/29/24.      Assessment & Plan:   Essential hypertension Assessment & Plan: Taking valsartan -hydrochlorothiazide  160-12.5 daily.  Home bp readings generally 130-140 range and diastolic at goal.  Continue current dosing for now. If needed we will change from daily to bid.       Return in about 6 months (around 07/31/2024) for HTN.    Laneta Pintos, MD

## 2024-01-29 NOTE — Assessment & Plan Note (Signed)
 Taking valsartan -hydrochlorothiazide  160-12.5 daily.  Home bp readings generally 130-140 range and diastolic at goal.  Continue current dosing for now. If needed we will change from daily to bid.

## 2024-01-29 NOTE — Patient Instructions (Addendum)
 It was nice to see you today,  We addressed the following topics today: -I will send all your prescriptions through the mail in pharmacy. - Your Xanax  is available at the [facility] now.  The mail-in pharmacy prescription for Xanax  will come in next month. - Keep the leg wound clean.  No need to put a dressing on it.  You can use a little layer of Vaseline.  Do not soak it, just wash with soap and water in the shower. - If it looks like it is increasing in tenderness, swelling, redness or discharge let us  know and we will reevaluate it.  Have a great day,  Etha Henle, MD

## 2024-01-29 NOTE — Telephone Encounter (Signed)
 I dont have any paperwork for a PA or 90 day supply request.  I also do not see where she has ever received 90 day supplies in the past, the most I have seen is 2 month supplies.  It looks like it has been over a month since her last refill of this medication so I will send in another 30 day supply while this gets sorted out.  If you have time, please call her insurance company to see if we can confirm they've been sending us  these requests.

## 2024-02-02 DIAGNOSIS — S81801A Unspecified open wound, right lower leg, initial encounter: Secondary | ICD-10-CM | POA: Insufficient documentation

## 2024-02-02 NOTE — Assessment & Plan Note (Signed)
-  healing abrasion on leg - no signs of infection - Treat: recommended thin layer of Vaseline, no bandage, air dry, soap and water cleaning, avoid soaking

## 2024-02-03 ENCOUNTER — Other Ambulatory Visit: Payer: Self-pay | Admitting: Family Medicine

## 2024-02-03 MED ORDER — VALSARTAN-HYDROCHLOROTHIAZIDE 160-12.5 MG PO TABS
1.0000 | ORAL_TABLET | Freq: Every day | ORAL | 1 refills | Status: DC
Start: 2024-02-03 — End: 2024-02-26

## 2024-02-03 NOTE — Telephone Encounter (Signed)
 Copied from CRM [id number]. Topic: Clinical - Medication Refill >> Feb 03, 2024  3:21 PM [name] wrote: Medication: valsartan -hydrochlorothiazide  (DIOVAN -HCT) 160-12.5 MG tablet  Has the patient contacted their pharmacy? Yes (Agent: If no, request that the patient contact the pharmacy for the refill. If patient does not wish to contact the pharmacy document the reason why and proceed with request.) (Agent: If yes, when and what did the pharmacy advise?)  This is the patient's preferred pharmacy:   [facility] [address] [place] - [address] [facility] [place] [address] Phone: [phone] Fax: [phone]   Is this the correct pharmacy for this prescription? Yes If no, delete pharmacy and type the correct one.   Has the prescription been filled recently? Yes  Is the patient out of the medication? Yes, four pills left  Has the patient been seen for an appointment in the last year OR does the patient have an upcoming appointment? Yes  Can we respond through MyChart? Yes  Agent: Please be advised that Rx refills may take up to 3 business days. We ask that you follow-up with your pharmacy.

## 2024-02-03 NOTE — Addendum Note (Signed)
 Addended by: Dallie Duel C on: 02/03/2024 03:40 PM   Modules accepted: Orders

## 2024-02-03 NOTE — Addendum Note (Signed)
 Addended by: ZIMMERMAN RUMPLE, Tijah Hane D on: 02/03/2024 05:59 PM   Modules accepted: Orders

## 2024-02-05 ENCOUNTER — Ambulatory Visit: Payer: Self-pay | Admitting: Family Medicine

## 2024-02-05 NOTE — Telephone Encounter (Signed)
 FYI Only or Action Required?: FYI only for provider  Patient was last seen in primary care on 01/29/2024 by Laneta Pintos, MD. Called Nurse Triage reporting Open Wound. Symptoms began about a month ago. Interventions attempted: Other: Vaseline. Symptoms are: unchanged.  Triage Disposition: Home Care  Patient/caregiver understands and will follow disposition?: Patient wants to know if Dr. Arabella Beach has Medihoney. This RN tried to schedule patient an appointment but Dr. Arabella Beach has no availability until 6/19. This RN advised pt to go to urgent care if she feels like the wound is not healing properly. Patient states she knows of an urgent care nearby and is agreeable.                Copied from CRM [id number]. Topic: Clinical - Red Word Triage >> Feb 05, 2024 10:34 AM [name] wrote: Red Word that prompted transfer to Nurse Triage: Wound on leg is hurting when she's walking, it was draining again yesterday. Patient is wanting to see if she can Medihoney, also. Reason for Disposition  Minor cut or scratch  Answer Assessment - Initial Assessment Questions 1. APPEARANCE of INJURY: "What does the injury look like?"      Wound on leg for one month, was seen by Dr. Arabella Beach, oozing again yesterday, wants to know if she can put Medihoney on it 2. SIZE: "How large is the cut?"      Quarter 3. BLEEDING: "Is it bleeding now?" If Yes, ask: "Is it difficult to stop?"      Oozing pink/red, has stopped now 4. LOCATION: "Where is the injury located?"      Right leg 5. ONSET: "How long ago did the injury occur?"      One month ago 6. MECHANISM: "Tell me how it happened."      Went to get up and hit a foot stool  Protocols used: Skin Injury-A-AH

## 2024-02-05 NOTE — Telephone Encounter (Signed)
 Contacted pt to offer her a nurse visit for provider to look at it if she feels like she needs to have it evaluated.  She stated that it has not changed considerably it was just oozing and she stated that she is going to try and get some medihoney to see if it will work.  She will call back if she feels like it needs to be looked at.

## 2024-02-13 ENCOUNTER — Other Ambulatory Visit: Payer: Self-pay | Admitting: Family Medicine

## 2024-02-13 NOTE — Telephone Encounter (Unsigned)
 Copied from CRM [id number]. Topic: Clinical - Medication Refill >> Feb 13, 2024 12:18 PM [name] wrote: Medication:  ALPRAZolam  (XANAX ) 0.25 MG tablet  valsartan -hydrochlorothiazide  (DIOVAN -HCT) 160-12.5 MG tablet - states does wrong for this one - provider told her to take 2  has 13-15 left but needs new Rx sent to [facility]    Has the patient contacted their pharmacy? No - new pharmacy  (Agent: If no, request that the patient contact the pharmacy for the refill. If patient does not wish to contact the pharmacy document the reason why and proceed with request.) (Agent: If yes, when and what did the pharmacy advise?)  This is the patient's preferred pharmacy:  [facility] Phone: [phone] Fax: [phone]   Is this the correct pharmacy for this prescription? Yes If no, delete pharmacy and type the correct one.   Has the prescription been filled recently? Yes  Is the patient out of the medication? No  Has the patient been seen for an appointment in the last year OR does the patient have an upcoming appointment? Yes  Can we respond through MyChart? No  Agent: Please be advised that Rx refills may take up to 3 business days. We ask that you follow-up with your pharmacy.

## 2024-02-26 ENCOUNTER — Telehealth: Payer: Self-pay | Admitting: Family Medicine

## 2024-02-26 MED ORDER — VALSARTAN-HYDROCHLOROTHIAZIDE 160-12.5 MG PO TABS: 1.0000 | ORAL_TABLET | Freq: Every day | ORAL | 5 refills | Status: AC

## 2024-02-26 NOTE — Telephone Encounter (Signed)
 Copied from CRM [id number]. Topic: Clinical - Medication Refill >> Feb 26, 2024  3:11 PM Santiya F wrote: Medication: valsartan -hydrochlorothiazide  (DIOVAN -HCT) 160-12.5 MG tablet [[id number]]  Has the patient contacted their pharmacy? Yes (Agent: If no, request that the patient contact the pharmacy for the refill. If patient does not wish to contact the pharmacy document the reason why and proceed with request.) (Agent: If yes, when and what did the pharmacy advise?)  This is the patient's preferred pharmacy:   [facility] - [place], [place] - [address] [place] [address] Phone: [phone] Fax: [phone]  Is this the correct pharmacy for this prescription? Yes If no, delete pharmacy and type the correct one.   Has the prescription been filled recently? Yes  Is the patient out of the medication? Yes  Has the patient been seen for an appointment in the last year OR does the patient have an upcoming appointment? Yes  Can we respond through MyChart? No  Agent: Please be advised that Rx refills may take up to 3 business days. We ask that you follow-up with your pharmacy.

## 2024-03-06 DIAGNOSIS — N3090 Cystitis, unspecified without hematuria: Secondary | ICD-10-CM | POA: Diagnosis not present

## 2024-03-06 DIAGNOSIS — N3001 Acute cystitis with hematuria: Secondary | ICD-10-CM | POA: Diagnosis not present

## 2024-03-08 ENCOUNTER — Ambulatory Visit: Payer: Self-pay

## 2024-03-08 NOTE — Telephone Encounter (Signed)
 first attempt: LVM for patient to return call [phone]   Copied from CRM [id number]. Topic: Clinical - Prescription Issue >> Mar 08, 2024 10:00 AM Everette C wrote: Reason for CRM: The patient would like to be contacted by a member of clinical staff when possible to discuss their quantity/directions for their ALPRAZolam  (XANAX ) 0.25 MG tablet [[id number]] prescription   Please contact if/when possible

## 2024-03-08 NOTE — Telephone Encounter (Signed)
 FYI Only or Action Required?: Action required by provider: medication refill request and change in medication order to prescribed xanax  0.25 mg 3 times daily as needed instead of 2 times daily and to send refill for #30 day supply to [facility] today  and if future prescriptions for refills can be sent to [facility] in the future..  Patient was last seen in primary care on 01/29/2024 by Chandra Toribio POUR, MD.  Called Nurse Triage reporting Medication Problem.  Symptoms began today.  Interventions attempted: Nothing.  Symptoms are: stable.  Triage Disposition: Call PCP When Office is Open  Patient/caregiver understands and will follow disposition?: Yes patient out of medication         Reason for Disposition  [1] Caller has NON-URGENT medicine question about med that PCP prescribed AND [2] triager unable to answer question  Answer Assessment - Initial Assessment Questions 1. NAME of MEDICINE: What medicine(s) are you calling about?     Xanax  0.25 mg 2. QUESTION: What is your question? (e.g., double dose of medicine, side effect)     Patient requesting if PCP can change prescription order to take medication 3 times a day as needed instead of 2 times daily as needed. Please send to [facility]. And if PCP will change prescription to 3 times daily as needed, please send Rx to [facility] for future refills. 3. PRESCRIBER: Who prescribed the medicine? Reason: if prescribed by specialist, call should be referred to that group.     PCP 4. SYMPTOMS: Do you have any symptoms? If Yes, ask: What symptoms are you having?  How bad are the symptoms (e.g., mild, moderate, severe)     Na  5. PREGNANCY:  Is there any chance that you are pregnant? When was your last menstrual period?     Na  Patient requesting refill for xanax  0. 25mg  #30 supply to [facility] and in future send to [facility] after prescription change.  See above.  Protocols used: Medication Question Call-A-AH

## 2024-03-09 ENCOUNTER — Telehealth: Payer: Self-pay | Admitting: *Deleted

## 2024-03-09 ENCOUNTER — Other Ambulatory Visit: Payer: Self-pay | Admitting: Family Medicine

## 2024-03-09 MED ORDER — ALPRAZOLAM 0.25 MG PO TABS: ORAL_TABLET | ORAL | 0 refills | Status: DC

## 2024-03-09 NOTE — Telephone Encounter (Signed)
 Please let the patient know that I can increase it to 75 since she has been under more stress lately but I don't want to increase it to 3 times daily because I'd like for her at some point to come back down to twice daily and it will be much more difficult if she has been taking it 3 times a day consistently.  She can use the extra 15 tablets a month for days she is experiencing more stress than other days. [facility]

## 2024-03-09 NOTE — Telephone Encounter (Signed)
 Pt called and stated that she went to UC on Sunday and was diagnosed with a UTI and was given Cipro  for 7 days.  She was told to follow up with us  and she wants to see about just doing a urine at a nurse visit. I told her that I would see if provider was good with that or if she needs an appt.

## 2024-03-09 NOTE — Telephone Encounter (Signed)
 Is she wanting a urine test after she finishes the antibiotics to make sure the infection was gone or is she wanting us  to repeat it because she is unsure about whether or not to take the antibiotics that were prescribed?   If she has taken the abx and wants to do a test of cure then a nurse visit is fine.

## 2024-03-09 NOTE — Telephone Encounter (Signed)
 Called pt she is advised of her Rx and recommendation

## 2024-03-10 NOTE — Telephone Encounter (Signed)
 Pt is scheduled for a nurse visit for this.

## 2024-03-16 ENCOUNTER — Other Ambulatory Visit: Payer: Self-pay | Admitting: *Deleted

## 2024-03-16 ENCOUNTER — Other Ambulatory Visit: Payer: Self-pay

## 2024-03-16 ENCOUNTER — Ambulatory Visit (INDEPENDENT_AMBULATORY_CARE_PROVIDER_SITE_OTHER): Admitting: Family Medicine

## 2024-03-16 DIAGNOSIS — R300 Dysuria: Secondary | ICD-10-CM

## 2024-03-16 LAB — POCT URINALYSIS DIP (CLINITEK)
Bilirubin, UA: NEGATIVE
Glucose, UA: NEGATIVE mg/dL
Ketones, POC UA: NEGATIVE mg/dL
Leukocytes, UA: NEGATIVE
Nitrite, UA: NEGATIVE
POC PROTEIN,UA: NEGATIVE
Spec Grav, UA: 1.02 (ref 1.010–1.025)
Urobilinogen, UA: 0.2 U/dL
pH, UA: 7 (ref 5.0–8.0)

## 2024-03-16 MED ORDER — EZETIMIBE 10 MG PO TABS: 10.0000 mg | ORAL_TABLET | Freq: Every day | ORAL | 3 refills | Status: AC

## 2024-03-16 NOTE — Progress Notes (Signed)
 Patient came into recheck urine after UTI diagnosis.

## 2024-03-18 LAB — URINE CULTURE: Organism ID, Bacteria: NO GROWTH

## 2024-03-18 LAB — SPECIMEN STATUS REPORT

## 2024-03-19 ENCOUNTER — Ambulatory Visit: Payer: Self-pay | Admitting: Family Medicine

## 2024-03-24 ENCOUNTER — Encounter: Payer: Self-pay | Admitting: Pharmacist

## 2024-03-24 NOTE — Progress Notes (Signed)
 Pharmacy Quality Measure Review  This patient is appearing on a report for being at risk of failing the adherence measure for hypertension (ACEi/ARB) medications this calendar year.   Medication: valsartan  hydrochlorothiazide  160/12.5mg  Last fill date: 12/30/2023 for 30 day supply per adherence report  Reviewed refill history - per Dr Annemarie database patient has filled valsartan  hydrochlorothiazide  160/12.5mg  for the following dates:  12/04/2023 - 30 day supply 12/30/2023 - 30 day supply 02/03/2024 - 30 day supply 02/27/2024 - 90 day supply  Insurance report was not up to date. No action needed at this time.   Madelin Ray, PharmD Clinical Pharmacist [facility]  Population Health [phone]

## 2024-03-29 ENCOUNTER — Telehealth: Payer: Self-pay

## 2024-03-29 NOTE — Telephone Encounter (Signed)
 Copied from CRM [id number]. Topic: General - Other >> Mar 26, 2024  1:29 PM Tiffini S wrote: Reason for CRM: Patient sates that she was able to locate the medication for  ALPRAZolam  (XANAX ) 0.25 MG tablet/ that she has no other concerns at this time. She has received the medication and misplaced. Medication refill was not submitted- per the patient she is okay and no further assistance is needed.

## 2024-04-01 ENCOUNTER — Ambulatory Visit: Payer: Self-pay

## 2024-04-01 NOTE — Telephone Encounter (Signed)
 FYI Only or Action Required?: Action required by provider: request for appointment.  Patient was last seen in primary care on 03/16/2024 by Chandra Toribio POUR, MD.  Called Nurse Triage reporting Urinary Tract Infection.  Symptoms began a week ago.  Interventions attempted: Nothing.  Symptoms are: unchanged.  Triage Disposition: See Physician Within 24 Hours  Patient/caregiver understands and will follow disposition?: Yes, will follow disposition  Copied from CRM #[id number]. Topic: Clinical - Red Word Triage >> Apr 01, 2024  3:58 PM Willma R wrote: Red Word that prompted transfer to Nurse Triage: Patient states thinks she may have another bladder infection. She hasn't been feeling well, fatigue and when she bends she has pain in her lower back. Urine is also very yellow. Reason for Disposition  Age > 50 years  Answer Assessment - Initial Assessment Questions 1. SEVERITY: How bad is the pain?  (e.g., Scale 1-10; mild, moderate, or severe)     Lower back pain only when bends over -5 2. FREQUENCY: How many times have you had painful urination today?      Denies pain with urination 4. ONSET: When did the painful urination start?      Pt states no pain, but has to strain to get urine out, states about a week 5. FEVER: Do you have a fever? If Yes, ask: What is your temperature, how was it measured, and when did it start?     denies 6. PAST UTI: Have you had a urine infection before? If Yes, ask: When was the last time? and What happened that time?      Mild hx, not many 7. CAUSE: What do you think is causing the painful urination?  (e.g., UTI, scratch, Herpes sore)     uti 8. OTHER SYMPTOMS: Do you have any other symptoms? (e.g., blood in urine, flank pain, genital sores, urgency, vaginal discharge)     Back pain when bends over,  Protocols used: Urination Pain - Female-A-AH

## 2024-04-01 NOTE — Telephone Encounter (Signed)
 Patient is schedule

## 2024-04-02 ENCOUNTER — Ambulatory Visit (INDEPENDENT_AMBULATORY_CARE_PROVIDER_SITE_OTHER)

## 2024-04-02 VITALS — BP 179/76 | HR 66 | Temp 97.4°F | Ht 64.0 in | Wt 123.1 lb

## 2024-04-02 DIAGNOSIS — R300 Dysuria: Secondary | ICD-10-CM

## 2024-04-02 LAB — POCT URINALYSIS DIP (CLINITEK)
Bilirubin, UA: NEGATIVE
Glucose, UA: NEGATIVE mg/dL
Ketones, POC UA: NEGATIVE mg/dL
Nitrite, UA: NEGATIVE
POC PROTEIN,UA: NEGATIVE
Spec Grav, UA: 1.02 (ref 1.010–1.025)
Urobilinogen, UA: 0.2 U/dL
pH, UA: 7 (ref 5.0–8.0)

## 2024-04-02 MED ORDER — CEPHALEXIN 500 MG PO CAPS: 500.0000 mg | ORAL_CAPSULE | Freq: Three times a day (TID) | ORAL | 0 refills | Status: AC

## 2024-04-02 NOTE — Progress Notes (Signed)
 Acute Office Visit  Subjective:     Patient ID: [name], female    DOB: [birth year], 83 y.o.   MRN: [id number]  Chief Complaint  Patient presents with   Urinary Tract Infection    HPI  Discussed the use of AI scribe software for clinical note transcription with the patient, who gave verbal consent to proceed.  History of Present Illness [name] is an 83 year old female who presents with symptoms suggestive of a urinary tract infection (UTI).  Lower urinary tract symptoms - Bloating and general malaise - Urine increasingly yellow in color - Difficulty urinating, requiring significant straining ('push, push, push') - No dysuria - No fevers  Urinary tract infection history and antibiotic use - History of frequent urinary tract infections approximately 15 to 20 years ago, with no episodes since until current symptoms - No prior use of Bactrim - Previous use of Keflex  without adverse effects     ROS Per HPI     Objective:    BP (!) 179/76   Pulse 66   Temp (!) 97.4 F (36.3 C) (Oral)   Ht 5' 4 (1.626 m)   Wt 123 lb 1.9 oz (55.8 kg)   LMP  (LMP Unknown)   SpO2 99%   BMI 21.13 kg/m    Physical Exam Constitutional:      General: She is not in acute distress.    Appearance: Normal appearance.  Cardiovascular:     Rate and Rhythm: Normal rate and regular rhythm.     Heart sounds: Normal heart sounds. No murmur heard.    No friction rub. No gallop.  Pulmonary:     Effort: Pulmonary effort is normal. No respiratory distress.     Breath sounds: Normal breath sounds.  Abdominal:     Comments: No suprapubic tenderness No L or R CVA tenderness  Musculoskeletal:        General: No swelling.  Skin:    General: Skin is warm and dry.  Neurological:     General: No focal deficit present.     Mental Status: She is alert.  Psychiatric:        Mood and Affect: Mood normal.        Behavior: Behavior normal.        Thought Content: Thought content  normal.    Results for orders placed or performed in visit on 04/02/24  POCT URINALYSIS DIP (CLINITEK)  Result Value Ref Range   Color, UA yellow yellow   Clarity, UA clear clear   Glucose, UA negative negative mg/dL   Bilirubin, UA negative negative   Ketones, POC UA negative negative mg/dL   Spec Grav, UA 8.979 8.989 - 1.025   Blood, UA small (A) negative   pH, UA 7.0 5.0 - 8.0   POC PROTEIN,UA negative negative, trace   Urobilinogen, UA 0.2 0.2 or 1.0 E.U./dL   Nitrite, UA Negative Negative   Leukocytes, UA Trace (A) Negative        Assessment & Plan:   Assessment and Plan Assessment & Plan Urinary tract infection Mild UTI with hematuria and trace leukocytes, no nitrites. No fever or dysuria. History of frequent UTIs 15-20 years ago. - Prescribed Keflex  TID for 5 days due to previous tolerance - Advised taking Keflex  with food to prevent stomach upset. - Ordered urine culture for bacterial sensitivity to Keflex . Plan to switch antibiotics if resistant. - Instructed to complete full course of antibiotics. - Advised to call if  symptoms worsen over the weekend or do not improve by Monday.   Return if symptoms worsen or fail to improve.  Saddie JULIANNA Sacks, PA-C

## 2024-04-02 NOTE — Assessment & Plan Note (Signed)
 Mild UTI with hematuria and trace leukocytes, no nitrites. No fever or dysuria. History of frequent UTIs 15-20 years ago. - Prescribed Keflex  TID for 5 days due to previous tolerance - Advised taking Keflex  with food to prevent stomach upset. - Ordered urine culture for bacterial sensitivity to Keflex . Plan to switch antibiotics if resistant. - Instructed to complete full course of antibiotics. - Advised to call if symptoms worsen over the weekend or do not improve by Monday.

## 2024-04-02 NOTE — Patient Instructions (Addendum)
 VISIT SUMMARY: Today, you were seen for symptoms that suggest a urinary tract infection (UTI). You reported bloating, general malaise, yellow urine, and difficulty urinating. You also mentioned experiencing low back pain that worsens when bending over.  YOUR PLAN: URINARY TRACT INFECTION: You have a mild urinary tract infection with some blood in your urine and trace white blood cells, but no fever or pain while urinating. You have a history of frequent UTIs 15-20 years ago. -You have been prescribed Keflex  to take three times a day for 5 days. Please take it with food to prevent stomach upset. -A urine culture has been ordered to check if the bacteria are sensitive to Keflex . We may switch your antibiotics if needed. -Make sure to complete the full course of antibiotics. -Call us  if your symptoms worsen over the weekend or do not improve by Monday.  If you have any problems before your next visit feel free to message me via MyChart (minor issues or questions) or call the office, otherwise you may reach out to schedule an office visit.  Thank you! Saddie Sacks, PA-C

## 2024-04-04 LAB — URINE CULTURE: Organism ID, Bacteria: NO GROWTH

## 2024-04-12 ENCOUNTER — Other Ambulatory Visit: Payer: Self-pay | Admitting: Family Medicine

## 2024-04-12 MED ORDER — ALPRAZOLAM 0.25 MG PO TABS: ORAL_TABLET | ORAL | 0 refills | Status: DC

## 2024-04-12 NOTE — Telephone Encounter (Signed)
 Copied from CRM #[id number]. Topic: Clinical - Medication Refill >> Apr 12, 2024 11:45 AM Winona R wrote: Medication:  ALPRAZolam  (XANAX ) 0.25 MG tablet    Has the patient contacted their pharmacy? No (Agent: If no, request that the patient contact the pharmacy for the refill. If patient does not wish to contact the pharmacy document the reason why and proceed with request.) (Agent: If yes, when and what did the pharmacy advise?)  This is the patient's preferred pharmacy:    [facility] - [place], [place] - [address] [place] [address] Phone: [phone] Fax: [phone]  Is this the correct pharmacy for this prescription? Yes If no, delete pharmacy and type the correct one.   Has the prescription been filled recently? Yes  Is the patient out of the medication? Yes  Has the patient been seen for an appointment in the last year OR does the patient have an upcoming appointment? Yes  Can we respond through MyChart? No  Agent: Please be advised that Rx refills may take up to 3 business days. We ask that you follow-up with your pharmacy.

## 2024-04-15 ENCOUNTER — Ambulatory Visit: Payer: Self-pay

## 2024-04-15 NOTE — Telephone Encounter (Signed)
 FYI Only or Action Required?: FYI only for provider.  Patient was last seen in primary care on 03/16/2024 by Vicki Toribio POUR, MD.  Called Nurse Triage reporting Recurrent UTI.  Symptoms began several weeks ago.  Interventions attempted: Prescription medications: completed Keflex ,  and Rest, hydration, or home remedies.  Symptoms are: lower back pain, left flank pain, dark colored urine (dark yellow) gradually worsening.  Triage Disposition: See Physician Within 24 Hours (overriding See HCP Within 4 Hours (Or PCP Triage))  Patient/caregiver understands and will follow disposition?: Yes                Copied from CRM [id number]. Topic: Clinical - Red Word Triage >> Apr 15, 2024 10:44 AM Charlet HERO wrote: Red Word that prompted transfer to Nurse Triage: Patient is stating that she is having lower back pain she is stating that she had  2 uti's and she completed the meds but her urine is still really dark. Reason for Disposition  Side (flank) or lower back pain present  Answer Assessment - Initial Assessment Questions Patient states she tries to drink plenty of fluids, she drinks a big bottle of water and propel. Unsure of fluid oz. She states she drinks a few of those a day.  1. SYMPTOM: What's the main symptom you're concerned about? (e.g., frequency, incontinence)     Darker colored urine (dark yellow), lower back and flank pain. She states she has not noticed urinary frequency.  2. ONSET: When did the  symptoms  start?     She states she had the darker colored urine since July (she thought it was from eating cranberries); lower back pain x 2 weeks, worsened.  3. PAIN: Is there any pain? If Yes, ask: How bad is it? (Scale: 1-10; mild, moderate, severe)     Yes. Lower back 5-6/10. Intermittent. No pain at this time.  4. CAUSE: What do you think is causing the symptoms?     Recurrent UTIs in July and August. She states she thinks there is something else wrong with  her kidneys.  5. OTHER SYMPTOMS: Do you have any other symptoms? (e.g., blood in urine, fever, flank pain, pain with urination)     Left flank pain. Denies blood in urine, fever, vomiting, confusion.  6. PREGNANCY: Is there any chance you are pregnant? When was your last menstrual period?     N/A.  Protocols used: Urinary Symptoms-A-AH

## 2024-04-16 ENCOUNTER — Ambulatory Visit (INDEPENDENT_AMBULATORY_CARE_PROVIDER_SITE_OTHER): Admitting: Family Medicine

## 2024-04-16 ENCOUNTER — Encounter: Payer: Self-pay | Admitting: Family Medicine

## 2024-04-16 VITALS — BP 192/72 | HR 68 | Ht 64.0 in | Wt 125.0 lb

## 2024-04-16 DIAGNOSIS — M533 Sacrococcygeal disorders, not elsewhere classified: Secondary | ICD-10-CM

## 2024-04-16 DIAGNOSIS — G8929 Other chronic pain: Secondary | ICD-10-CM | POA: Insufficient documentation

## 2024-04-16 DIAGNOSIS — R8290 Unspecified abnormal findings in urine: Secondary | ICD-10-CM | POA: Insufficient documentation

## 2024-04-16 DIAGNOSIS — M549 Dorsalgia, unspecified: Secondary | ICD-10-CM | POA: Diagnosis not present

## 2024-04-16 LAB — POCT URINALYSIS DIP (MANUAL ENTRY)
Bilirubin, UA: NEGATIVE
Glucose, UA: NEGATIVE mg/dL
Ketones, POC UA: NEGATIVE mg/dL
Nitrite, UA: NEGATIVE
Protein Ur, POC: NEGATIVE mg/dL
Spec Grav, UA: 1.015 (ref 1.010–1.025)
Urobilinogen, UA: 0.2 U/dL
pH, UA: 7.5 (ref 5.0–8.0)

## 2024-04-16 MED ORDER — MELOXICAM 7.5 MG PO TABS: 7.5000 mg | ORAL_TABLET | Freq: Every day | ORAL | 0 refills | Status: AC

## 2024-04-16 NOTE — Patient Instructions (Signed)
 It was nice to see you today,  We addressed the following topics today: - Your pain is coming from either your left hip socket or left sacroiliac joint.  I am prescribing meloxicam .  Take 1 to 2 tablets a day as needed.  Take Tylenol  as well.  Use topical heat and topical over-the-counter pain relievers as needed. - I am sending in a order for x-rays.  You can get these done at [facility] imaging on [address] at any time during their business hours.  No appointment necessary. - If pain becomes severe and you cannot wait over the weekend then you can go to [facility] urgent care at their [address] location. - I am sending in a urine culture.  I will get the results by Monday.   Have a great day,  Rolan Slain, MD

## 2024-04-16 NOTE — Assessment & Plan Note (Signed)
 Urinalysis shows trace leukocyte esterase and trace blood. - Urine culture sent. Will have results on Monday.

## 2024-04-16 NOTE — Progress Notes (Signed)
 Acute Office Visit  Subjective:     Patient ID: [name], female    DOB: 1941/01/08, 83 y.o.   MRN: [id number]  Chief Complaint  Patient presents with   Back Pain    HPI Patient is in today for   Subjective - Low back pain. Intermittent for ~3 months, now more frequent. Located across the low back, can radiate up the side. Pain is exacerbated by sitting and bending over. Denies radiation down the legs. Reports recent strong-smelling urine which is now improving.  - The pain is suspected to be from the left hip socket or left sacroiliac joint.  - The urine culture results will be available on Monday.  Medications Takes baby aspirin  daily, denies any other blood thinners.  PMH, PSH, FH, Social Hx Not discussed.  ROS - GU: Reports recent dark, strong-smelling urine that has since cleared. - MSK: Reports low back pain. - Neuro: Denies radiation of pain into lower extremities.    ROS      Objective:    BP (!) 192/72   Pulse 68   Ht 5' 4 (1.626 m)   Wt 125 lb (56.7 kg)   LMP  (LMP Unknown)   SpO2 98%   BMI 21.46 kg/m    Physical Exam Gen: alert, oriented MSK: No pain with palpation over the lumbar spine. Pain elicited over the left sacroiliac joint area with FABER test. Straight leg raise is negative bilaterally.  Results for orders placed or performed in visit on 04/16/24  POCT urinalysis dipstick  Result Value Ref Range   Color, UA yellow yellow   Clarity, UA clear clear   Glucose, UA negative negative mg/dL   Bilirubin, UA negative negative   Ketones, POC UA negative negative mg/dL   Spec Grav, UA 8.984 8.989 - 1.025   Blood, UA small (A) negative   pH, UA 7.5 5.0 - 8.0   Protein Ur, POC negative negative mg/dL   Urobilinogen, UA 0.2 0.2 or 1.0 E.U./dL   Nitrite, UA Negative Negative   Leukocytes, UA Trace (A) Negative        Assessment & Plan:   Chronic left SI joint pain Assessment & Plan: Presents with a three-month history of  intermittent left-sided low back pain, which has become more frequent. Pain is worse with sitting and bending over. Examination is notable for tenderness over the left sacroiliac joint with provocative testing. Straight leg raise is negative. The leading differential is left sacroiliac joint dysfunction. - Prescribed meloxicam  7.5 mg, take 1-2 tablets daily as needed. - May use Tylenol  as needed. - Advised on topical heat and over-the-counter pain relievers as needed. - Ordered X-ray of the hip to be done at [facility]. - If pain becomes severe over the weekend, may go to [facility]. - Will follow up with results of imaging to determine need for orthopedic referral.  Orders: -     DG HIP UNILAT W OR W/O PELVIS 2-3 VIEWS LEFT; Future -     DG Pelvis 1-2 Views; Future  Back pain, unspecified back location, unspecified back pain laterality, unspecified chronicity -     POCT urinalysis dipstick -     Urine Culture  Abnormal urinalysis Assessment & Plan: Urinalysis shows trace leukocyte esterase and trace blood. - Urine culture sent. Will have results on Monday.   Other orders -     Meloxicam ; Take 1-2 tablets (7.5-15 mg total) by mouth daily.  Dispense: 30 tablet; Refill: 0  No follow-ups on file.  Toribio MARLA Slain, MD

## 2024-04-16 NOTE — Assessment & Plan Note (Signed)
 Presents with a three-month history of intermittent left-sided low back pain, which has become more frequent. Pain is worse with sitting and bending over. Examination is notable for tenderness over the left sacroiliac joint with provocative testing. Straight leg raise is negative. The leading differential is left sacroiliac joint dysfunction. - Prescribed meloxicam  7.5 mg, take 1-2 tablets daily as needed. - May use Tylenol  as needed. - Advised on topical heat and over-the-counter pain relievers as needed. - Ordered X-ray of the hip to be done at [facility]. - If pain becomes severe over the weekend, may go to [facility]. - Will follow up with results of imaging to determine need for orthopedic referral.

## 2024-04-18 LAB — URINE CULTURE

## 2024-04-19 ENCOUNTER — Ambulatory Visit: Payer: Self-pay | Admitting: Family Medicine

## 2024-04-19 NOTE — Telephone Encounter (Signed)
 Spoke with patient/ related message from Dr. Chandra.

## 2024-04-20 ENCOUNTER — Ambulatory Visit
Admission: RE | Admit: 2024-04-20 | Discharge: 2024-04-20 | Disposition: A | Discharge status: Home / Self Care | Source: Ambulatory Visit | Attending: Family Medicine

## 2024-04-20 DIAGNOSIS — M25552 Pain in left hip: Secondary | ICD-10-CM | POA: Diagnosis not present

## 2024-04-20 DIAGNOSIS — G8929 Other chronic pain: Secondary | ICD-10-CM

## 2024-04-26 ENCOUNTER — Telehealth: Payer: Self-pay

## 2024-04-26 NOTE — Telephone Encounter (Signed)
 Xray has not been read by radiology yet.

## 2024-04-26 NOTE — Telephone Encounter (Signed)
Called pt she is advised

## 2024-04-26 NOTE — Telephone Encounter (Signed)
 Copied from CRM #[id number]. Topic: Clinical - Lab/Test Results >> Apr 26, 2024  2:11 PM [name] wrote: Reason for CRM: Pt called for xray results please advise

## 2024-05-04 ENCOUNTER — Telehealth: Payer: Self-pay

## 2024-05-04 NOTE — Telephone Encounter (Signed)
 Copied from CRM #[id number]. Topic: Clinical - Lab/Test Results >> Apr 26, 2024  2:11 PM Rosaria BRAVO wrote: Reason for CRM: Pt called for xray results please advise

## 2024-05-05 NOTE — Telephone Encounter (Signed)
 Called pt no able to LVM if patient contacts the office please advised

## 2024-05-05 NOTE — Telephone Encounter (Signed)
-----   Message from Saddie JULIANNA Sacks sent at 05/04/2024  5:32 PM EDT ----- Patient's x-ray did not show any evidence of hip dislocation or fracture. The radiologist recommended follow up MRI if there is concern for a ligamentous tear, which I will be happy to order if the  patient is okay with this.   She can continue with Meloxicam  for now for pain and if it gets too severe, Dr. Chandra recommended [facility]. ----- Message ----- From: Interface, Rad Results In Sent: 05/02/2024  11:13 AM EDT To: Toribio MARLA Chandra, MD

## 2024-05-06 NOTE — Telephone Encounter (Signed)
-----   Message from Saddie JULIANNA Sacks sent at 05/04/2024  5:32 PM EDT ----- Patient's x-ray did not show any evidence of hip dislocation or fracture. The radiologist recommended follow up MRI if there is concern for a ligamentous tear, which I will be happy to order if the  patient is okay with this.   She can continue with Meloxicam  for now for pain and if it gets too severe, Dr. Chandra recommended [facility]. ----- Message ----- From: Interface, Rad Results In Sent: 05/02/2024  11:13 AM EDT To: Toribio MARLA Chandra, MD

## 2024-05-06 NOTE — Telephone Encounter (Signed)
 Called patient she is advised of her lab results and recommendation she stated that she does not want a MRI at this time

## 2024-05-06 NOTE — Progress Notes (Signed)
   05/06/2024  Patient ID: [name], female   DOB: 07-30-1941, 83 y.o.   MRN: [id number]  Pharmacy Quality Measure Review  This patient is appearing on a report for being at risk of failing the adherence measure for hypertension (ACEi/ARB) medications this calendar year.   Medication: valsartan  hctz Last fill date: 02/27/24 for 90 day supply  Insurance report was not up to date. No action needed at this time.   Lang Sieve, PharmD, BCGP Clinical Pharmacist  [phone]

## 2024-05-20 ENCOUNTER — Ambulatory Visit: Payer: Self-pay

## 2024-05-20 NOTE — Telephone Encounter (Signed)
 FYI Only or Action Required?: FYI only for provider.  Patient was last seen in primary care on 04/16/2024 by Chandra Toribio POUR, MD.  Called Nurse Triage reporting Abdominal Pain.  Symptoms began x 2 months ago.  Interventions attempted: Rest, hydration, or home remedies.  Symptoms are: unchanged.  Triage Disposition: See PCP Within 2 Weeks  Patient/caregiver understands and will follow disposition?: Yes  **See note below**       Copied from CRM [id number]. Topic: Clinical - Red Word Triage >> May 20, 2024 12:43 PM Suzen RAMAN wrote: Red Word that prompted transfer to Nurse Triage: multiple UTI, Back and Stomach pain Reason for Disposition  Abdominal pain is a chronic symptom (recurrent or ongoing AND present > 4 weeks)  Answer Assessment - Initial Assessment Questions 1. LOCATION: Where does it hurt?      Lower bottom of stomach  2. RADIATION: Does the pain shoot anywhere else? (e.g., chest, back)     Lower Back   3. ONSET: When did the pain begin? (e.g., minutes, hours or days ago)      Ongoing x 2 months   4. SUDDEN: Gradual or sudden onset?     Gradual   5. PATTERN Does the pain come and go, or is it constant?     Intermittent   6. SEVERITY: How bad is the pain?  (e.g., Scale 1-10; mild, moderate, or severe)     4/10   7. RECURRENT SYMPTOM: Have you ever had this type of stomach pain before? If Yes, ask: When was the last time? and What happened that time?      Yes, ongoing   8. CAUSE: What do you think is causing the stomach pain? (e.g., gallstones, recent abdominal surgery)      Unknown    9. RELIEVING/AGGRAVATING FACTORS: What makes it better or worse? (e.g., antacids, bending or twisting motion, bowel movement)      Nothing makes it better or worse  10. OTHER SYMPTOMS: Do you have any other symptoms? (e.g., back pain, diarrhea, fever, urination pain, vomiting)  Back pain, no other symptoms noted. She denies any urinary frequency,  burning or pain. She mentioned at times she feels as if it is hard to go to the bathroom as far as urination. Pt. Advised to seek care in UC/ED if she is having urinary retention; she agrees with plan of care. Pt. Also scheduled for follow-up appointment related to ongoing abdominal/back pain.  Protocols used: Abdominal Pain - Female-A-AH

## 2024-05-24 ENCOUNTER — Other Ambulatory Visit: Payer: Self-pay | Admitting: Family Medicine

## 2024-05-25 NOTE — Progress Notes (Signed)
 MEOSHIA BILLING                                          MRN: [id number]   05/25/2024   The [facility] Quality Team Specialist reviewed this patient medical record for the purposes of chart review for care gap closure. The following were reviewed: chart review for care gap closure-controlling blood pressure.    [facility] Quality Team

## 2024-05-27 ENCOUNTER — Telehealth: Payer: Self-pay

## 2024-05-27 ENCOUNTER — Encounter: Payer: Self-pay | Admitting: Family Medicine

## 2024-05-27 ENCOUNTER — Ambulatory Visit (INDEPENDENT_AMBULATORY_CARE_PROVIDER_SITE_OTHER): Admitting: Family Medicine

## 2024-05-27 VITALS — BP 192/76 | HR 69 | Ht 64.0 in | Wt 123.1 lb

## 2024-05-27 DIAGNOSIS — R102 Pelvic and perineal pain: Secondary | ICD-10-CM

## 2024-05-27 DIAGNOSIS — I10 Essential (primary) hypertension: Secondary | ICD-10-CM

## 2024-05-27 DIAGNOSIS — F439 Reaction to severe stress, unspecified: Secondary | ICD-10-CM | POA: Diagnosis not present

## 2024-05-27 LAB — POCT URINALYSIS DIP (CLINITEK)
Bilirubin, UA: NEGATIVE
Glucose, UA: NEGATIVE mg/dL
Ketones, POC UA: NEGATIVE mg/dL
Nitrite, UA: NEGATIVE
POC PROTEIN,UA: NEGATIVE
Spec Grav, UA: 1.015 (ref 1.010–1.025)
Urobilinogen, UA: 0.2 U/dL
pH, UA: 6 (ref 5.0–8.0)

## 2024-05-27 MED ORDER — SPIRONOLACTONE 25 MG PO TABS: 12.5000 mg | ORAL_TABLET | Freq: Two times a day (BID) | ORAL | 3 refills | Status: AC

## 2024-05-27 NOTE — Telephone Encounter (Signed)
 Copied from CRM [id number]. Topic: General - Other >> May 27, 2024 11:14 AM Joesph NOVAK wrote: Reason for CRM: Ultrasound order, Need more specifications on what the doctor is trying to look at.  [place], calling from [facility].  [phone] is the call back number for the radiology line.  Routed to [facility] in error; PCP not in this office

## 2024-05-27 NOTE — Patient Instructions (Addendum)
 It was nice to see you today,  We addressed the following topics today: -I believe your pain is related to bladder distention.  I am sending in a order for an ultrasound.  Someone call you to schedule this. - If you ever have a complete inability to urinate and this last several hours you should go to the emergency department - I would like your husband to schedule a follow-up visit with me to discuss his recent cancer diagnosis.  It would be helpful if both of you came to his next appointment. - I have prescribed spironolactone .  You can take a half a tablet twice a day with your other blood pressure medications   Have a great day,  Rolan Slain, MD

## 2024-05-27 NOTE — Progress Notes (Signed)
 Acute Office Visit  Subjective:     Patient ID: [name], female    DOB: 1940/09/18, 83 y.o.   MRN: [id number]  Chief Complaint  Patient presents with   Back Pain   Abdominal Pain    HPI Patient is in today for   Subjective - Reports new pain, different from the left-sided hip/back pain in August. Describes it as across the lower abdomen, starting in the front and radiating to the back. Pain is intermittent, not present today or yesterday, but was daily prior. Describes it as excruciating at times. Pain worsened around the time of her husband's recent diagnosis, which has been a source of stress. Pain is not consistently sharp or dull. No clear alleviating or aggravating factors identified with eating or bowel movements.  - Reports urinary symptoms. Describes urinary hesitancy, feeling of a full bladder followed by difficulty initiating urination and straining. Sometimes feels a sense of incomplete bladder emptying. No dysuria currently. Urinates 5-10 times per day. History of UTIs, and the current pain has been occurring off and on since the UTIs. Reports urine is still dark. No urinary odor.  - Reports straining with bowel movements most of the time. Bowel movements occur daily or every other day. Reports occasional hemorrhoids, with last use of hemorrhoid cream about a week ago.  - Reports her husband is not coping well with his recent stage IV lung cancer diagnosis.  Medications Reports taking Xanax , which is filled via a mail service pharmacy. There was a recent issue with a refill for Xanax , which was due on 05/15/2024, but she believes it has been resolved.  PMH, PSH, FH, Social Hx PMHx: Recurrent UTIs, hemorrhoids. PSH: Hysterectomy. FH: Not discussed. Social Hx: Reports significant stress related to her husband's recent illness.  ROS Gen: Denies current pain. GU: Positive for urinary hesitancy, straining to urinate, sensation of incomplete emptying, and dark urine.  Denies dysuria, urinary odor, or frequency >10 times/day. Denies feeling of vaginal prolapse. Had four vaginal births. GI: Positive for straining with bowel movements. Reports daily or every-other-day BMs. Denies pain is worsened by eating or defecation.         ROS      Objective:    BP (!) 192/76   Pulse 69   Ht 5' 4 (1.626 m)   Wt 123 lb 1.9 oz (55.8 kg)   LMP  (LMP Unknown)   SpO2 97%   BMI 21.13 kg/m    Physical Exam Gen: alert, oriented Pulm: no respiratory distress GI: Abdomen is soft. Tenderness to palpation in the suprapubic area, worse on the right. Psych: pleasant affect     Assessment & Plan:   Suprapubic pain Assessment & Plan: Lower Abdominal Pain, likely secondary to Bladder Distention The presentation of suprapubic pain that radiates to the back, urinary hesitancy, and straining is suspicious for bladder outlet obstruction and subsequent bladder distention, possibly from pelvic organ prolapse given her history of multiple vaginal births and a hysterectomy. The pain is intermittent and absent today. Urinary symptoms persist. - Plan to obtain a urine sample for urinalysis and urine microscopy to evaluate for infection, crystals, or other abnormalities. - Will order an abdominal ultrasound to evaluate the bladder and other pelvic organs. Will consider a vaginal ultrasound if the abdominal ultrasound is non-diagnostic. - Discussed potential referral to a urogynecologist for further evaluation and management, which could include options like a pessary or surgical correction, depending on findings. - Counseled to seek care at the emergency  department if she develops acute urinary retention (inability to urinate for several hours), explaining the risk of nerve damage from prolonged bladder distention.  Orders: -     US  Abdomen Complete; Future -     POCT URINALYSIS DIP (CLINITEK) -     Urine Microscopic -     Urine Culture; Future  Stress Assessment &  Plan: Patient continues to experience significant stress related to her husband's health. - Offered support and medication for depressive symptoms if they arise.    Essential hypertension Assessment & Plan: Sending in prescription for spironolactone  daily.    Other orders -     Spironolactone ; Take 0.5 tablets (12.5 mg total) by mouth 2 (two) times daily.  Dispense: 90 tablet; Refill: 3   I spent 30 min in the mgmt of this patient  Return for already scheduled.  Toribio MARLA Slain, MD

## 2024-05-28 ENCOUNTER — Ambulatory Visit: Payer: Self-pay | Admitting: Family Medicine

## 2024-05-28 LAB — URINALYSIS, MICROSCOPIC ONLY
Bacteria, UA: NONE SEEN
Casts: NONE SEEN /LPF
Epithelial Cells (non renal): NONE SEEN /HPF (ref 0–10)
WBC, UA: NONE SEEN /HPF (ref 0–5)

## 2024-05-28 NOTE — Telephone Encounter (Signed)
 She has lower abdominal pain.  I suspect it may due to bladder distension but I'm looking for anything that could be causing her lower abdominal pain

## 2024-05-29 ENCOUNTER — Emergency Department (HOSPITAL_COMMUNITY)

## 2024-05-29 ENCOUNTER — Encounter (HOSPITAL_COMMUNITY): Payer: Self-pay | Admitting: *Deleted

## 2024-05-29 ENCOUNTER — Emergency Department (HOSPITAL_COMMUNITY): Admission: EM | Admit: 2024-05-29 | Discharge: 2024-05-29 | Disposition: A | Discharge status: Home / Self Care

## 2024-05-29 ENCOUNTER — Other Ambulatory Visit: Payer: Self-pay

## 2024-05-29 DIAGNOSIS — D414 Neoplasm of uncertain behavior of bladder: Secondary | ICD-10-CM | POA: Diagnosis not present

## 2024-05-29 DIAGNOSIS — N3289 Other specified disorders of bladder: Secondary | ICD-10-CM | POA: Diagnosis not present

## 2024-05-29 DIAGNOSIS — Z7982 Long term (current) use of aspirin: Secondary | ICD-10-CM | POA: Diagnosis not present

## 2024-05-29 DIAGNOSIS — I10 Essential (primary) hypertension: Secondary | ICD-10-CM | POA: Diagnosis not present

## 2024-05-29 DIAGNOSIS — R1030 Lower abdominal pain, unspecified: Secondary | ICD-10-CM | POA: Diagnosis not present

## 2024-05-29 DIAGNOSIS — R5383 Other fatigue: Secondary | ICD-10-CM | POA: Diagnosis not present

## 2024-05-29 LAB — CBC
HCT: 38.5 % (ref 36.0–46.0)
Hemoglobin: 12.7 g/dL (ref 12.0–15.0)
MCH: 30.3 pg (ref 26.0–34.0)
MCHC: 33 g/dL (ref 30.0–36.0)
MCV: 91.9 fL (ref 80.0–100.0)
Platelets: 202 K/uL (ref 150–400)
RBC: 4.19 MIL/uL (ref 3.87–5.11)
RDW: 13.1 % (ref 11.5–15.5)
WBC: 8 K/uL (ref 4.0–10.5)
nRBC: 0 % (ref 0.0–0.2)

## 2024-05-29 LAB — URINALYSIS, ROUTINE W REFLEX MICROSCOPIC
Bilirubin Urine: NEGATIVE
Glucose, UA: NEGATIVE mg/dL
Hgb urine dipstick: NEGATIVE
Ketones, ur: NEGATIVE mg/dL
Leukocytes,Ua: NEGATIVE
Nitrite: NEGATIVE
Protein, ur: NEGATIVE mg/dL
Specific Gravity, Urine: 1.008 (ref 1.005–1.030)
pH: 7 (ref 5.0–8.0)

## 2024-05-29 LAB — COMPREHENSIVE METABOLIC PANEL WITH GFR
ALT: 16 U/L (ref 0–44)
AST: 20 U/L (ref 15–41)
Albumin: 3.7 g/dL (ref 3.5–5.0)
Alkaline Phosphatase: 55 U/L (ref 38–126)
Anion gap: 9 (ref 5–15)
BUN: 17 mg/dL (ref 8–23)
CO2: 28 mmol/L (ref 22–32)
Calcium: 9.1 mg/dL (ref 8.9–10.3)
Chloride: 99 mmol/L (ref 98–111)
Creatinine, Ser: 0.91 mg/dL (ref 0.44–1.00)
GFR, Estimated: >60 mL/min (ref >60)
Glucose, Bld: 102 mg/dL — ABNORMAL HIGH (ref 70–99)
Potassium: 4.1 mmol/L (ref 3.5–5.1)
Sodium: 136 mmol/L (ref 135–145)
Total Bilirubin: 0.9 mg/dL (ref 0.0–1.2)
Total Protein: 6.3 g/dL — ABNORMAL LOW (ref 6.5–8.1)

## 2024-05-29 LAB — URINE CULTURE: Organism ID, Bacteria: NO GROWTH

## 2024-05-29 LAB — LIPASE, BLOOD: Lipase: 33 U/L (ref 11–51)

## 2024-05-29 MED ORDER — ACETAMINOPHEN 500 MG PO TABS
1000.0000 mg | ORAL_TABLET | Freq: Once | ORAL | Status: AC
  Administered 2024-05-29: 1000 mg via ORAL
  Filled 2024-05-29: qty 2

## 2024-05-29 NOTE — ED Notes (Signed)
 Pt ambulated to restroom. Steady gait. No complaints.

## 2024-05-29 NOTE — ED Provider Triage Note (Signed)
 Emergency Medicine Provider Triage Evaluation Note  [name] , a 83 y.o. female  was evaluated in triage.  Pt complains of lower abdominal pressure along with difficult urination, this has been a chronic problem going on for last several months however she states that over the last several days it has gotten worse and is why she called EMS for transport.  Pain is pelvic in nature, does radiate to the back, she endorses frequent difficulty in passing urine, has to strain frequently.  Seen by her PCP yesterday, suspecting bladder distention at this time..  Review of Systems  Positive: As above Negative:   Physical Exam  BP (!) 201/86 (BP Location: Left Arm)   Pulse 71   Temp 98.4 F (36.9 C) (Oral)   Resp 20   Ht 5' 4 (1.626 m)   Wt 55.8 kg   LMP  (LMP Unknown)   SpO2 97%   BMI 21.11 kg/m  Gen:   Awake, no distress   Resp:  Normal effort  MSK:   Moves extremities without difficulty  Other:  Mild discomfort to palpation of the lower abdomen.  Medical Decision Making  Medically screening exam initiated at 3:17 PM.  Appropriate orders placed.  HEATHER MCKENDREE was informed that the remainder of the evaluation will be completed by another provider, this initial triage assessment does not replace that evaluation, and the importance of remaining in the ED until their evaluation is complete.  Given her symptoms of difficulty urination and pelvic discomfort and fullness, obtain ultrasound imaging of the pelvis to evaluate for possible cystocele.   Myriam Dorn BROCKS, [place] 05/29/24 [id number]

## 2024-05-29 NOTE — ED Triage Notes (Signed)
 C/o abd. Pain off and on x 6 weeks, states she was seen by her doctor on Thurs. And was supposed to be scheduled for u/s however doctor never called her back to arrange it. Denies n/v/d . LBM was this am.

## 2024-05-29 NOTE — ED Provider Notes (Signed)
 [facility] Provider Note   CSN: [id number] Arrival date & time: 05/29/24  1438     Patient presents with: Abdominal Pain   [name] is a 83 y.o. female.   Abdominal Pain Patient is a 83 year old female with PMH of recurrent UTIs resenting to the ED with chief complaint of 6 weeks of suprapubic sharp abdominal pains with radiations to her back bilaterally without focal inciting event -denies aggravating/alleviating factors.  Of note, patient further endorses urinary hesitancy with difficulty voiding.  She denies any headaches, vision changes, neck pain, flank pain, chest pain, abdominal pain, changes in bowel movements, fevers, chills, nausea, vomiting, diaphoresis.  Also of note, patient seen by PCP 09/25, at which time she discussed a pelvic ultrasound, however patient reports that she has not yet heard back from her PCP regarding outpatient scheduling.  Patient denies any new medications or recent medication changes.  Denies any changes in gait or new neurologic symptoms.  Prior to Admission medications   Medication Sig Start Date End Date Taking? Authorizing Provider  ALPRAZolam  (XANAX ) 0.25 MG tablet TAKE 1 TABLET BY MOUTH 2 TO 3 TIMES DAILY AS NEEDED FOR ANXIETY 05/24/24   Chandra Toribio POUR, MD  AREXVY 120 MCG/0.5ML injection  10/11/23   [provider]  aspirin  81 MG EC tablet Take 1 tablet (81 mg total) by mouth daily. Swallow whole. 01/03/22   Danford, Lonni SQUIBB, MD  Aspirin -Acetaminophen -Caffeine (GOODY HEADACHE PO) Take 1 packet by mouth daily as needed (headache).    [provider]  busPIRone  (BUSPAR ) 5 MG tablet Take 1 tablet (5 mg total) by mouth 3 (three) times daily. 12/08/23   Chandra Toribio POUR, MD  Cholecalciferol (VITAMIN D3 PO) Take 1 capsule by mouth 2 (two) times daily.    [provider]  ezetimibe  (ZETIA ) 10 MG tablet Take 1 tablet (10 mg total) by mouth daily. 03/16/24   Chandra Toribio POUR, MD   levothyroxine  (SYNTHROID ) 75 MCG tablet TAKE 1 TABLET EVERY DAY 12/08/23   Chandra Toribio POUR, MD  MAGNESIUM PO Take 1 tablet by mouth daily.    [provider]  meloxicam  (MOBIC ) 7.5 MG tablet Take 1-2 tablets (7.5-15 mg total) by mouth daily. 04/16/24   Chandra Toribio POUR, MD  omeprazole  (PRILOSEC) 40 MG capsule Take 1 capsule (40 mg total) by mouth daily. 12/08/23   Chandra Toribio POUR, MD  spironolactone  (ALDACTONE ) 25 MG tablet Take 0.5 tablets (12.5 mg total) by mouth 2 (two) times daily. 05/27/24   Chandra Toribio POUR, MD  valsartan -hydrochlorothiazide  (DIOVAN -HCT) 160-12.5 MG tablet Take 1 tablet by mouth daily. 02/26/24   Chandra Toribio POUR, MD  vitamin B-12 (CYANOCOBALAMIN ) 1000 MCG tablet Take 1,000 mcg by mouth daily.    [provider]    Allergies: Statins, Shellfish allergy, and Simvastatin     Review of Systems  Gastrointestinal:  Positive for abdominal pain.    Updated Vital Signs BP (!) 215/89 (BP Location: Right Arm)   Pulse 74   Temp 98.4 F (36.9 C) (Oral)   Resp 16   Ht 5' 4 (1.626 m)   Wt 55.8 kg   LMP  (LMP Unknown)   SpO2 100%   BMI 21.11 kg/m   Physical Exam Constitutional:      Appearance: She is well-developed.  HENT:     Head: Normocephalic and atraumatic.     Right Ear: Ear canal and external ear normal.     Left Ear: Ear canal and  external ear normal.     Nose: Nose normal.     Mouth/Throat:     Mouth: Mucous membranes are moist.     Pharynx: Oropharynx is clear.  Eyes:     Extraocular Movements: Extraocular movements intact.     Conjunctiva/sclera: Conjunctivae normal.     Pupils: Pupils are equal, round, and reactive to light.  Cardiovascular:     Rate and Rhythm: Normal rate and regular rhythm.     Pulses: Normal pulses.     Heart sounds: Normal heart sounds.  Pulmonary:     Effort: Pulmonary effort is normal.     Breath sounds: Normal breath sounds.  Abdominal:     General: Abdomen is flat. There is no distension.     Palpations:  Abdomen is soft.     Tenderness: There is no abdominal tenderness. There is no right CVA tenderness, left CVA tenderness, guarding or rebound.  Musculoskeletal:        General: Normal range of motion.     Cervical back: Normal range of motion.  Skin:    General: Skin is warm and dry.     Capillary Refill: Capillary refill takes less than 2 seconds.  Neurological:     General: No focal deficit present.     Mental Status: She is alert.     (all labs ordered are listed, but only abnormal results are displayed) Labs Reviewed  COMPREHENSIVE METABOLIC PANEL WITH GFR - Abnormal; Notable for the following components:      Result Value   Glucose, Bld 102 (*)    Total Protein 6.3 (*)    All other components within normal limits  LIPASE, BLOOD  CBC  URINALYSIS, ROUTINE W REFLEX MICROSCOPIC    EKG: None  Radiology: US  PELVIS LIMITED (TRANSABDOMINAL ONLY) Result Date: 05/29/2024 EXAM: RETROPERITONEAL ULTRASOUND COMPLETE 05/29/2024 04:56:47 PM TECHNIQUE: Real-time ultrasonography of the retroperitoneum was performed. COMPARISON: CT of the abdomen and pelvis 08/13/2018. CLINICAL HISTORY: Cystocele, unspecified. FINDINGS: BLADDER: Urinary bladder wall is normal apart from an 8 mm sessile lesion posteriorly. Now external herniations are present. PELVIC FINDINGS: Additional hypoechoic structure posterior to the urinary bladder measures 5.0 x 2.0 x 2.5 cm. This is consistent with a small fluid collection associated with the vaginal cuff as seen on the prior study. IMPRESSION: 1. No cystocele 2. 8 mm sessile lesion in the posterior urinary bladder wall. his is nonspecific and may represent a small polyp. Recommend follow-up bladder ultrasound in 3 to 6 months. 3. Small fluid collection (5.0 x 2.0 x 2.5 cm) associated with the vaginal cuff, similar to the prior the prior study. Electronically signed by: Lonni Necessary MD 05/29/2024 05:34 PM EDT RP Workstation: [id number]     Procedures    Medications Ordered in the ED  acetaminophen  (TYLENOL ) tablet 1,000 mg (1,000 mg Oral Given 05/29/24 1719)                                    Medical Decision Making Amount and/or Complexity of Data Reviewed Labs: ordered.  Risk OTC drugs.   83 year old female with PMH as above presenting to the ED with chief complaint of urinary hesitancy with difficulty/straining with voiding and sharp suprapubic discomfort with radiations to her back x 6 weeks without accompanying infectious symptoms.  Of note, patient recently evaluated by PCP 09/25.  She denies any recent medication changes or new medications.  Arrival, patient hypertensive 201/86.  Afebrile.  No tachycardia or tachypnea.  Saturating 97% RA.  GCS 15.  Chart review, patient with chronic systolic hypertension and wide pulse pressure.  Abdominal examination benign.  Abdomen soft, nondistended, nontender.  Bladder scan approximately 500 cc with PVR 35 cc.  No evidence of acute urinary retention at this time.  Patient reports symptomatic improvement after voiding.  No focal neurologic deficits.  Normal gait without evidence of antalgia/glue-footed ambulation.  CMP without evidence of acute emergent electrolytic derangement or renal impairment.  No AKI.  Urinalysis WNL without evidence of infection or renal calculus.  Patient given 1 g Tylenol  for symptomatic improvement upon reassessment.  Pelvic US  without evidence of cystocele.  8 mm sessile lesion in the posterior urinary bladder wall noted.  At this time, patient hemodynamically stable, asymptomatic and appropriate for discharge with no further emergent workup.  Ambulatory referral to urogynecology included with AVS.  Strict return precautions discussed.  Final diagnoses:  Bladder polyp    ED Discharge Orders          Ordered    Ambulatory referral to Urogynecology        05/29/24 1751               Markham Dumlao, Elsie, MD 05/29/24 1752    Simon Lavonia SAILOR,  MD 05/29/24 1836

## 2024-05-30 DIAGNOSIS — R102 Pelvic and perineal pain: Secondary | ICD-10-CM | POA: Insufficient documentation

## 2024-05-30 DIAGNOSIS — F439 Reaction to severe stress, unspecified: Secondary | ICD-10-CM | POA: Insufficient documentation

## 2024-05-30 NOTE — Assessment & Plan Note (Signed)
 Patient continues to experience significant stress related to her husband's health. - Offered support and medication for depressive symptoms if they arise.

## 2024-05-30 NOTE — Assessment & Plan Note (Signed)
 Lower Abdominal Pain, likely secondary to Bladder Distention The presentation of suprapubic pain that radiates to the back, urinary hesitancy, and straining is suspicious for bladder outlet obstruction and subsequent bladder distention, possibly from pelvic organ prolapse given her history of multiple vaginal births and a hysterectomy. The pain is intermittent and absent today. Urinary symptoms persist. - Plan to obtain a urine sample for urinalysis and urine microscopy to evaluate for infection, crystals, or other abnormalities. - Will order an abdominal ultrasound to evaluate the bladder and other pelvic organs. Will consider a vaginal ultrasound if the abdominal ultrasound is non-diagnostic. - Discussed potential referral to a urogynecologist for further evaluation and management, which could include options like a pessary or surgical correction, depending on findings. - Counseled to seek care at the emergency department if she develops acute urinary retention (inability to urinate for several hours), explaining the risk of nerve damage from prolonged bladder distention.

## 2024-05-30 NOTE — Assessment & Plan Note (Signed)
 Sending in prescription for spironolactone  daily.

## 2024-05-31 ENCOUNTER — Other Ambulatory Visit: Payer: Self-pay | Admitting: Family Medicine

## 2024-05-31 DIAGNOSIS — D414 Neoplasm of uncertain behavior of bladder: Secondary | ICD-10-CM

## 2024-06-01 ENCOUNTER — Telehealth: Payer: Self-pay

## 2024-06-01 NOTE — Telephone Encounter (Signed)
 Copied from CRM #[id number]. Topic: Clinical - Request for Lab/Test Order >> Jun 01, 2024 11:08 AM Shanda MATSU wrote: Reason for CRM: [place] w/[facility], phone #[phone] ext [phone], calling in req additional information on what exactly provider is trying to have imaged with ultrasound of the abdomen, I did adv her of notes that were placed by provider but she has additonal questions, req a call back in regards to this.

## 2024-06-01 NOTE — Telephone Encounter (Signed)
 We don't need this anymore.  She had a CT done.

## 2024-06-01 NOTE — Telephone Encounter (Signed)
 Confirmed with provider that she no longer needs his imaging, she had it done somewhere else.

## 2024-06-04 ENCOUNTER — Telehealth: Payer: Self-pay

## 2024-06-04 NOTE — Telephone Encounter (Signed)
 Called  [facility] spoke to the tech she is advised that no further action is needed per PCP

## 2024-06-04 NOTE — Telephone Encounter (Signed)
 Copied from CRM [id number]. Topic: Clinical - Request for Lab/Test Order >> Jun 04, 2024  8:41 AM Wess RAMAN wrote: Reason for CRM: [place] from [facility] stated this is the third time she has reached out and is still waiting for a response on patients imaging order. [place] stated the Super pubic pain needs to be updated to pelvic complete and needs to specify with or without contrast.   Callback #: [phone] Opt: 1, 5 ext [phone] for [facility] Fax #: [phone]

## 2024-06-04 NOTE — Addendum Note (Signed)
 Addended by: ZIMMERMAN RUMPLE, Karandeep Resende D on: 06/04/2024 08:52 AM   Modules accepted: Orders

## 2024-06-04 NOTE — Telephone Encounter (Signed)
 The patient had a pelvic ultrasound in the ED.  She does not need the ultrasound I ordered anymore.

## 2024-06-07 ENCOUNTER — Ambulatory Visit: Payer: Self-pay

## 2024-06-07 NOTE — Telephone Encounter (Signed)
 FYI Only or Action Required?: FYI only for provider.  Patient was last seen in primary care on 05/27/2024 by Chandra Toribio POUR, MD.  Called Nurse Triage reporting Abdominal Pain.  Symptoms began several weeks ago.  Symptoms are: unchanged.  Triage Disposition: See PCP Within 2 Weeks  Patient/caregiver understands and will follow disposition?: Yes       Copied from CRM [id number]. Topic: Clinical - Red Word Triage >> Jun 07, 2024  4:55 PM Leah C wrote: Red Word that prompted transfer to Nurse Triage:  Stomach pain, ramps up and then dies down- pain travels up to ribs. Was previously given acid reflux medicine but that did not work. Patient went to ER last weekend cause she could not urinate. She had a UTI. Patient was told she has a polyp on bladder but she doesnt think that is what is hurting her now.  Patient doesnt have any medication to help it.       Reason for Disposition  Abdominal pain is a chronic symptom (recurrent or ongoing AND present > 4 weeks)  Answer Assessment - Initial Assessment Questions Patient calling requesting to see another doctor, stating she believes she needs a scan of her stomach. Patient had a TOC appointment scheduled prior to NT.    1. LOCATION: Where does it hurt?      Lower abdominal pain  2. RADIATION: Does the pain shoot anywhere else? (e.g., chest, back)     Diffuse over abdomen  3. ONSET: When did the pain begin? (e.g., minutes, hours or days ago)      Several weeks ago  4. SUDDEN: Gradual or sudden onset?     Gradual  5. PATTERN Does the pain come and go, or is it constant?     Intermittent  6. SEVERITY: How bad is the pain?  (e.g., Scale 1-10; mild, moderate, or severe)     Mild to moderate  7. RECURRENT SYMPTOM: Have you ever had this type of stomach pain before? If Yes, ask: When was the last time? and What happened that time?      Yes 8. CAUSE: What do you think is causing the stomach pain? (e.g., gallstones,  recent abdominal surgery)     Unsure  9. RELIEVING/AGGRAVATING FACTORS: What makes it better or worse? (e.g., antacids, bending or twisting motion, bowel movement)     No 10. OTHER SYMPTOMS: Do you have any other symptoms? (e.g., back pain, diarrhea, fever, urination pain, vomiting)       No  Protocols used: Abdominal Pain - Female-A-AH

## 2024-06-08 ENCOUNTER — Telehealth (HOSPITAL_BASED_OUTPATIENT_CLINIC_OR_DEPARTMENT_OTHER): Payer: Self-pay | Admitting: Family Medicine

## 2024-06-08 NOTE — Telephone Encounter (Signed)
 Copied from CRM [id number]. Topic: Appointments - Transfer of Care >> Jun 07, 2024  4:53 PM Rea C wrote: Pt is requesting to transfer FROM: Chandra Toribio BEAL MD Pt is requesting to transfer TO: Dottie PONCE Norleen PHEBE MD Reason for requested transfer: [facility]  It is the responsibility of the team the patient would like to transfer to (Dr. Dottie II) to reach out to the patient if for any reason this transfer is not acceptable.  Dr Dottie do you approve this TOC?

## 2024-06-15 ENCOUNTER — Ambulatory Visit (INDEPENDENT_AMBULATORY_CARE_PROVIDER_SITE_OTHER): Admitting: Family Medicine

## 2024-06-15 ENCOUNTER — Encounter (HOSPITAL_BASED_OUTPATIENT_CLINIC_OR_DEPARTMENT_OTHER): Payer: Self-pay | Admitting: Family Medicine

## 2024-06-15 VITALS — BP 164/77 | HR 71 | Temp 98.7°F | Resp 16 | Ht 62.99 in | Wt 122.8 lb

## 2024-06-15 DIAGNOSIS — I10 Essential (primary) hypertension: Secondary | ICD-10-CM | POA: Diagnosis not present

## 2024-06-15 DIAGNOSIS — R109 Unspecified abdominal pain: Secondary | ICD-10-CM | POA: Insufficient documentation

## 2024-06-15 DIAGNOSIS — D494 Neoplasm of unspecified behavior of bladder: Secondary | ICD-10-CM | POA: Insufficient documentation

## 2024-06-15 DIAGNOSIS — R300 Dysuria: Secondary | ICD-10-CM | POA: Diagnosis not present

## 2024-06-15 DIAGNOSIS — R1084 Generalized abdominal pain: Secondary | ICD-10-CM | POA: Diagnosis not present

## 2024-06-15 DIAGNOSIS — R3129 Other microscopic hematuria: Secondary | ICD-10-CM | POA: Diagnosis not present

## 2024-06-15 LAB — CBC WITH DIFFERENTIAL/PLATELET
Basophils Absolute: 0 x10E3/uL (ref 0.0–0.2)
Basos: 1 %
EOS (ABSOLUTE): 0.1 x10E3/uL (ref 0.0–0.4)
Eos: 2 %
Hematocrit: 41.8 % (ref 34.0–46.6)
Hemoglobin: 13.6 g/dL (ref 11.1–15.9)
Immature Grans (Abs): 0 x10E3/uL (ref 0.0–0.1)
Immature Granulocytes: 0 %
Lymphocytes Absolute: 1.9 x10E3/uL (ref 0.7–3.1)
Lymphs: 25 %
MCH: 30.2 pg (ref 26.6–33.0)
MCHC: 32.5 g/dL (ref 31.5–35.7)
MCV: 93 fL (ref 79–97)
Monocytes Absolute: 0.5 x10E3/uL (ref 0.1–0.9)
Monocytes: 6 %
Neutrophils Absolute: 5 x10E3/uL (ref 1.4–7.0)
Neutrophils: 66 %
Platelets: 208 x10E3/uL (ref 150–450)
RBC: 4.51 x10E6/uL (ref 3.77–5.28)
RDW: 12.2 % (ref 11.7–15.4)
WBC: 7.6 x10E3/uL (ref 3.4–10.8)

## 2024-06-15 LAB — COMPREHENSIVE METABOLIC PANEL WITH GFR
ALT: 16 IU/L (ref 0–32)
AST: 15 IU/L (ref 0–40)
Albumin: 4.5 g/dL (ref 3.7–4.7)
Alkaline Phosphatase: 77 IU/L (ref 48–129)
BUN/Creatinine Ratio: 20 (ref 12–28)
BUN: 22 mg/dL (ref 8–27)
Bilirubin Total: 0.2 mg/dL (ref 0.0–1.2)
CO2: 27 mmol/L (ref 20–29)
Calcium: 9.6 mg/dL (ref 8.7–10.3)
Chloride: 99 mmol/L (ref 96–106)
Creatinine, Ser: 1.11 mg/dL — ABNORMAL HIGH (ref 0.57–1.00)
Globulin, Total: 2.4 g/dL (ref 1.5–4.5)
Glucose: 115 mg/dL — ABNORMAL HIGH (ref 70–99)
Potassium: 4.3 mmol/L (ref 3.5–5.2)
Sodium: 136 mmol/L (ref 134–144)
Total Protein: 6.9 g/dL (ref 6.0–8.5)
eGFR: 49 mL/min/1.73 — ABNORMAL LOW (ref >59)

## 2024-06-15 LAB — AMYLASE: Amylase: 73 U/L (ref 31–110)

## 2024-06-15 LAB — URINALYSIS, ROUTINE W REFLEX MICROSCOPIC
Bilirubin, UA: NEGATIVE
Glucose, UA: NEGATIVE
Ketones, UA: NEGATIVE
Leukocytes,UA: NEGATIVE
Nitrite, UA: NEGATIVE
Protein,UA: NEGATIVE
Specific Gravity, UA: 1.012 (ref 1.005–1.030)
Urobilinogen, Ur: 0.2 mg/dL (ref 0.2–1.0)
pH, UA: 7 (ref 5.0–7.5)

## 2024-06-15 LAB — MICROSCOPIC EXAMINATION
Bacteria, UA: NONE SEEN
Casts: NONE SEEN /LPF
Epithelial Cells (non renal): NONE SEEN /HPF (ref 0–10)
WBC, UA: NONE SEEN /HPF (ref 0–5)

## 2024-06-15 MED ORDER — DICYCLOMINE HCL 10 MG PO CAPS: 10.0000 mg | ORAL_CAPSULE | Freq: Three times a day (TID) | ORAL | 1 refills | Status: AC

## 2024-06-15 NOTE — Progress Notes (Unsigned)
 New Patient Office Visit  Subjective    Patient ID: [name], female    DOB: 21-Feb-1941  Age: 83 y.o. MRN: [id number]  CC:  Chief Complaint  Patient presents with   Transitions Of Care    Transfer of Care.   Pain    Stomach problems for 2 months. Worried about an ulcer. Sometimes it hurts on her sides or ribs. Sometimes she gets sharp back pains.    HPI [name] presents to establish care F/u as above.  New to my practice.  She has had weeks of intermittent abdominal pain that prompted her somewhat recent ER visit.  Some radiations to her back. She states she is moving her bowels fine.  Minimal dysuria.  No fever.  Light caffeine consumption.  Recent bladder polyp noted with upcoming Urology appointment.  She states that the pain is not triggered by meals.  She hasn't tried any antacids.  No h/o stomach issues reported.  Admits a rare Goody powder in months past, but nothing regular.  I advised her to avoid these in the future.  Outpatient Encounter Medications as of 06/15/2024  Medication Sig   ALPRAZolam  (XANAX ) 0.25 MG tablet TAKE 1 TABLET BY MOUTH 2 TO 3 TIMES DAILY AS NEEDED FOR ANXIETY   aspirin  81 MG EC tablet Take 1 tablet (81 mg total) by mouth daily. Swallow whole.   busPIRone  (BUSPAR ) 5 MG tablet Take 1 tablet (5 mg total) by mouth 3 (three) times daily.   Cholecalciferol (VITAMIN D3 PO) Take 1 capsule by mouth daily.   dicyclomine (BENTYL) 10 MG capsule Take 1 capsule (10 mg total) by mouth 4 (four) times daily -  before meals and at bedtime.   ezetimibe  (ZETIA ) 10 MG tablet Take 1 tablet (10 mg total) by mouth daily.   levothyroxine  (SYNTHROID ) 75 MCG tablet TAKE 1 TABLET EVERY DAY   MAGNESIUM PO Take 1 tablet by mouth daily.   meloxicam  (MOBIC ) 7.5 MG tablet Take 1-2 tablets (7.5-15 mg total) by mouth daily. (Patient taking differently: Take 7.5-15 mg by mouth as needed.)   omeprazole  (PRILOSEC) 40 MG capsule Take 1 capsule (40 mg total) by mouth daily.  (Patient taking differently: Take 40 mg by mouth as needed.)   spironolactone  (ALDACTONE ) 25 MG tablet Take 0.5 tablets (12.5 mg total) by mouth 2 (two) times daily.   valsartan -hydrochlorothiazide  (DIOVAN -HCT) 160-12.5 MG tablet Take 1 tablet by mouth daily.   vitamin B-12 (CYANOCOBALAMIN ) 1000 MCG tablet Take 1,000 mcg by mouth daily.   [DISCONTINUED] AREXVY 120 MCG/0.5ML injection  (Patient taking differently: 0.5 mLs.)   [DISCONTINUED] Aspirin -Acetaminophen -Caffeine (GOODY HEADACHE PO) Take 1 packet by mouth daily as needed (headache).   No facility-administered encounter medications on file as of 06/15/2024.    Past Medical History:  Diagnosis Date   Anxiety    Former smoker    Hyperlipidemia    Hypertension    Hypothyroid    Osteoarthritis    Pernicious anemia     Past Surgical History:  Procedure Laterality Date   ABDOMINAL HYSTERECTOMY     APPENDECTOMY     BREAST SURGERY Right    LUMPECTOMY- FIBROID TUMOR benign   COLONOSCOPY     hx polyps/ [facility]-mother    ENTEROCELE REPAIR     EXCISIONAL HEMORRHOIDECTOMY     fnger surgery  04/2018   POLYPECTOMY      Family History  Problem Relation Age of Onset   Colon cancer Mother 25   Anxiety disorder Mother  Deep vein thrombosis Mother        died age 71   Heart disease Father 60       smoker, high stress job   Colon cancer Brother    Hodgkin's lymphoma Brother 27   Breast cancer Daughter    Colon cancer Son 72   Colon cancer Maternal Aunt 68   Colon cancer Paternal Grandmother    Colon cancer Other 32   Colon polyps Neg Hx    Esophageal cancer Neg Hx    Rectal cancer Neg Hx    Stomach cancer Neg Hx    Transient ischemic attack Neg Hx    Stroke Neg Hx     Social History   Tobacco Use   Smoking status: Former    Current packs/day: 0.00    Average packs/day: 1 pack/day for 13.0 years (13.0 ttl pk-yrs)    Types: Cigarettes    Start date: 09/02/1965    Quit date: 09/02/1978    Years since quitting: 45.8     Passive exposure: Past   Smokeless tobacco: Never   Tobacco comments:    quit in 1980  Vaping Use   Vaping status: Never Used  Substance Use Topics   Alcohol use: No    Alcohol/week: 0.0 standard drinks of alcohol   Drug use: No    Review of Systems  Constitutional:  Negative for diaphoresis, fever, malaise/fatigue and weight loss.  Respiratory:  Negative for cough, shortness of breath and wheezing.   Cardiovascular:  Negative for chest pain, palpitations, orthopnea, claudication, leg swelling and PND.  Gastrointestinal:  Positive for abdominal pain. Negative for blood in stool, constipation, diarrhea, heartburn, melena and nausea.  Genitourinary:  Negative for flank pain, frequency, hematuria and urgency.        Objective    BP (!) 164/77   Pulse 71   Temp 98.7 F (37.1 C) (Oral)   Resp 16   Ht 5' 2.99 (1.6 m)   Wt 122 lb 12.8 oz (55.7 kg)   LMP  (LMP Unknown)   SpO2 97%   BMI 21.76 kg/m   Physical Exam Constitutional:      General: She is not in acute distress.    Appearance: Normal appearance.     Comments: Comfortable  HENT:     Head: Normocephalic.  Neck:     Vascular: No carotid bruit.  Cardiovascular:     Rate and Rhythm: Normal rate and regular rhythm.     Pulses: Normal pulses.     Heart sounds: Normal heart sounds.  Pulmonary:     Effort: Pulmonary effort is normal.     Breath sounds: Normal breath sounds.  Abdominal:     General: Bowel sounds are normal. There is no distension.     Palpations: Abdomen is soft. There is no mass.     Tenderness: There is abdominal tenderness. There is no guarding or rebound.  Genitourinary:    Rectum: Guaiac result negative.     Comments: Rectal exam done with chaperone present  Musculoskeletal:     Cervical back: Neck supple. No tenderness.     Right lower leg: No edema.     Left lower leg: No edema.  Neurological:     Mental Status: She is alert.     {Labs (Optional):23779}    Assessment & Plan:   Generalized abdominal pain Assessment & Plan: Unclear etiology.  Await labs and Urology input.  Possible Irritable bowel.  May need a stool study for H.  Pylori.  Proceed to the ER for any severe symptoms.  Orders: -     CBC with Differential/Platelet -     Comprehensive metabolic panel with GFR -     Amylase -     Urinalysis, Routine w reflex microscopic -     Dicyclomine HCl; Take 1 capsule (10 mg total) by mouth 4 (four) times daily -  before meals and at bedtime.  Dispense: 30 capsule; Refill: 1  Neoplasm of bladder Assessment & Plan: Await Urology input.   Microscopic hematuria Assessment & Plan: Await urine microscopy.   Dysuria -     POCT URINALYSIS DIP (CLINITEK)  Essential hypertension Assessment & Plan: Perhaps a stress component.  Will arrange for close follow up.   I personally spent a total of 45 minutes in the care of the patient today including performing a medically appropriate exam/evaluation, counseling and educating, documenting clinical information in the EHR, and communicating results.   No follow-ups on file.   REDDING PONCE NORLEEN FALCON., MD

## 2024-06-15 NOTE — Assessment & Plan Note (Addendum)
 Unclear etiology.  Await labs and Urology input.  Possible Irritable bowel.  May need a stool study for H. Pylori.  Proceed to the ER for any severe symptoms.

## 2024-06-15 NOTE — Assessment & Plan Note (Signed)
 Perhaps a stress component.  Will arrange for close follow up.

## 2024-06-15 NOTE — Assessment & Plan Note (Signed)
 Await urine microscopy.

## 2024-06-15 NOTE — Assessment & Plan Note (Signed)
Await Urology input

## 2024-06-16 ENCOUNTER — Ambulatory Visit (HOSPITAL_BASED_OUTPATIENT_CLINIC_OR_DEPARTMENT_OTHER): Payer: Self-pay | Admitting: Family Medicine

## 2024-06-16 LAB — POCT URINALYSIS DIP (CLINITEK)
Bilirubin, UA: NEGATIVE
Glucose, UA: NEGATIVE mg/dL
Ketones, POC UA: NEGATIVE mg/dL
Leukocytes, UA: NEGATIVE
Nitrite, UA: NEGATIVE
POC PROTEIN,UA: NEGATIVE
Spec Grav, UA: 1.02 (ref 1.010–1.025)
Urobilinogen, UA: 0.2 U/dL
pH, UA: 7 (ref 5.0–8.0)

## 2024-06-22 ENCOUNTER — Other Ambulatory Visit: Payer: Self-pay | Admitting: Family Medicine

## 2024-06-23 ENCOUNTER — Ambulatory Visit: Admitting: Urology

## 2024-06-23 VITALS — BP 134/76 | HR 7 | Ht 64.0 in | Wt 122.0 lb

## 2024-06-23 DIAGNOSIS — Z87891 Personal history of nicotine dependence: Secondary | ICD-10-CM | POA: Diagnosis not present

## 2024-06-23 DIAGNOSIS — R9341 Abnormal radiologic findings on diagnostic imaging of renal pelvis, ureter, or bladder: Secondary | ICD-10-CM | POA: Diagnosis not present

## 2024-06-23 LAB — MICROSCOPIC EXAMINATION

## 2024-06-23 LAB — URINALYSIS, ROUTINE W REFLEX MICROSCOPIC
Bilirubin, UA: NEGATIVE
Glucose, UA: NEGATIVE
Ketones, UA: NEGATIVE
Nitrite, UA: NEGATIVE
Protein,UA: NEGATIVE
Specific Gravity, UA: 1.015 (ref 1.005–1.030)
Urobilinogen, Ur: 0.2 mg/dL (ref 0.2–1.0)
pH, UA: 7 (ref 5.0–7.5)

## 2024-06-23 LAB — BLADDER SCAN AMB NON-IMAGING: Scan Result: 92

## 2024-06-23 NOTE — Progress Notes (Signed)
 Chief Complaint: Bladder abnormality  History of Present Illness:  83 year old female who comes in today, referred for bladder abnormality on pelvic ultrasound.  She has been evaluated for lower abdominal discomfort that has been intermittent at times.  No associated lower urinary tract symptoms, perhaps this discomfort gets better with defecation.  She has had no gross hematuria.  Microscopic hematuria noted on prior urinalysis.  Recent pelvic ultrasound revealed a 5 to 6 mm abnormality on the posterior surface of the bladder.  She is a former smoker.   Past Medical History:  Past Medical History:  Diagnosis Date   Anxiety    Former smoker    Hyperlipidemia    Hypertension    Hypothyroid    Osteoarthritis    Pernicious anemia     Past Surgical History:  Past Surgical History:  Procedure Laterality Date   ABDOMINAL HYSTERECTOMY     APPENDECTOMY     BREAST SURGERY Right    LUMPECTOMY- FIBROID TUMOR benign   COLONOSCOPY     hx polyps/ [facility]cc-mother    ENTEROCELE REPAIR     EXCISIONAL HEMORRHOIDECTOMY     fnger surgery  04/2018   POLYPECTOMY      Allergies:  Allergies  Allergen Reactions   Statins Other (See Comments)    On rosuvastatin -malaise/dizziness and near syncope   Shellfish Allergy Nausea And Vomiting and Other (See Comments)    Severe stomach pains, fever, sweating   Simvastatin  Other (See Comments)    Myalgia    Family History:  Family History  Problem Relation Age of Onset   Colon cancer Mother 81   Anxiety disorder Mother    Deep vein thrombosis Mother        died age 14   Heart disease Father 47       smoker, high stress job   Colon cancer Brother    Hodgkin's lymphoma Brother 9   Breast cancer Daughter    Colon cancer Son 64   Colon cancer Maternal Aunt 77   Colon cancer Paternal Grandmother    Colon cancer Other 32   Colon polyps Neg Hx    Esophageal cancer Neg Hx    Rectal cancer Neg Hx    Stomach cancer Neg Hx    Transient  ischemic attack Neg Hx    Stroke Neg Hx     Social History:  Social History   Tobacco Use   Smoking status: Former    Current packs/day: 0.00    Average packs/day: 1 pack/day for 13.0 years (13.0 ttl pk-yrs)    Types: Cigarettes    Start date: 09/02/1965    Quit date: 09/02/1978    Years since quitting: 45.8    Passive exposure: Past   Smokeless tobacco: Never   Tobacco comments:    quit in 1980  Vaping Use   Vaping status: Never Used  Substance Use Topics   Alcohol use: No    Alcohol/week: 0.0 standard drinks of alcohol   Drug use: No    Review of symptoms:  Constitutional:  Negative for unexplained weight loss, night sweats, fever, chills ENT:  Negative for nose bleeds, sinus pain, painful swallowing CV:  Negative for chest pain, shortness of breath, exercise intolerance, palpitations, loss of consciousness Resp:  Negative for cough, wheezing, shortness of breath GI:  Negative for nausea, vomiting, diarrhea, bloody stools GU:  Positives noted in HPI; otherwise negative for gross hematuria, dysuria, urinary incontinence Neuro:  Negative for seizures, poor balance, limb weakness, slurred  speech Psych:  Negative for lack of energy, depression, anxiety Endocrine:  Negative for polydipsia, polyuria, symptoms of hypoglycemia (dizziness, hunger, sweating) Hematologic:  Negative for anemia, purpura, petechia, prolonged or excessive bleeding, use of anticoagulants  Allergic:  Negative for difficulty breathing or choking as a result of exposure to anything; no shellfish allergy; no allergic response (rash/itch) to materials, foods  Physical exam: LMP  (LMP Unknown)  GENERAL APPEARANCE:  Well appearing, well developed, well nourished, NAD HEENT: Atraumatic, Normocephalic. NECK: Normal appearance LUNGS: Normal inspiratory and expiratory excursion HEART: Regular Rate EXTREMITIES: Moves all extremities well.  Without clubbing, cyanosis, or edema. NEUROLOGIC:  Alert and oriented x 3,  normal gait, CN II-XII grossly intact.  MENTAL STATUS:  Appropriate. SKIN:  Warm, dry and intact.     I have reviewed referring/prior physicians records  I have reviewed urinalysis  I have reviewed prior urine cultures  I reviewed prior imaging studies--I reviewed ultrasound images with the patient  Assessment: Small abnormality of the posterior surface of the bladder wall pelvic ultrasound.  Undetermined etiology.  She does have a history of smoking.   Plan: I will bring her back for cystoscopy

## 2024-07-05 ENCOUNTER — Other Ambulatory Visit (HOSPITAL_BASED_OUTPATIENT_CLINIC_OR_DEPARTMENT_OTHER): Payer: Self-pay | Admitting: Family Medicine

## 2024-07-05 DIAGNOSIS — F419 Anxiety disorder, unspecified: Secondary | ICD-10-CM

## 2024-07-05 NOTE — Telephone Encounter (Unsigned)
 Copied from CRM #[id number]. Topic: Clinical - Medication Refill >> Jul 05, 2024 12:06 PM Amy B wrote: Medication: ALPRAZolam  (XANAX ) 0.25 MG tablet  Has the patient contacted their pharmacy? Yes (Agent: If no, request that the patient contact the pharmacy for the refill. If patient does not wish to contact the pharmacy document the reason why and proceed with request.) (Agent: If yes, when and what did the pharmacy advise?)  This is the patient's preferred pharmacy:  [facility] [address], [place] - [address] [phone]  Is this the correct pharmacy for this prescription? Yes If no, delete pharmacy and type the correct one.   Has the prescription been filled recently? No  Is the patient out of the medication? Yes  Has the patient been seen for an appointment in the last year OR does the patient have an upcoming appointment? Yes  Can we respond through MyChart? No  Agent: Please be advised that Rx refills may take up to 3 business days. We ask that you follow-up with your pharmacy.

## 2024-07-06 MED ORDER — ALPRAZOLAM 0.25 MG PO TABS: ORAL_TABLET | ORAL | 0 refills | Status: AC

## 2024-07-06 NOTE — Telephone Encounter (Signed)
 Filling 30 days for Dr. Dottie, his imprivata is not currently working but he is working with IT.

## 2024-07-21 ENCOUNTER — Other Ambulatory Visit: Admitting: Urology

## 2024-07-27 ENCOUNTER — Telehealth (HOSPITAL_BASED_OUTPATIENT_CLINIC_OR_DEPARTMENT_OTHER): Payer: Self-pay | Admitting: Family Medicine

## 2024-07-27 ENCOUNTER — Telehealth (HOSPITAL_BASED_OUTPATIENT_CLINIC_OR_DEPARTMENT_OTHER): Payer: Self-pay | Admitting: *Deleted

## 2024-07-27 NOTE — Telephone Encounter (Signed)
 Spoke with pt

## 2024-07-27 NOTE — Telephone Encounter (Unsigned)
 Copied from CRM [id number]. Topic: Clinical - Medication Refill >> Jul 27, 2024  8:33 AM Berwyn MATSU wrote: Medication: Amlopdopine 2.5mg    Has the patient contacted their pharmacy? Yes (Agent: If no, request that the patient contact the pharmacy for the refill. If patient does not wish to contact the pharmacy document the reason why and proceed with request.) (Agent: If yes, when and what did the pharmacy advise?)  This is the patient's preferred pharmacy:  [facility] - [place], [place] - [address] [place] [address] Phone: [phone] Fax: [phone]    Is this the correct pharmacy for this prescription? Yes If no, delete pharmacy and type the correct one.   Has the prescription been filled recently? No  Is the patient out of the medication? Yes  Has the patient been seen for an appointment in the last year OR does the patient have an upcoming appointment? Yes  Can we respond through MyChart? No  Agent: Please be advised that Rx refills may take up to 3 business days. We ask that you follow-up with your pharmacy.

## 2024-07-31 ENCOUNTER — Encounter: Payer: Self-pay | Admitting: Internal Medicine

## 2024-08-02 ENCOUNTER — Ambulatory Visit: Admitting: Family Medicine

## 2024-08-02 ENCOUNTER — Telehealth (HOSPITAL_BASED_OUTPATIENT_CLINIC_OR_DEPARTMENT_OTHER): Payer: Self-pay | Admitting: Family Medicine

## 2024-08-02 DIAGNOSIS — F419 Anxiety disorder, unspecified: Secondary | ICD-10-CM

## 2024-08-02 NOTE — Telephone Encounter (Signed)
 Copied from CRM #[id number]. Topic: Clinical - Medication Refill >> Aug 02, 2024  3:19 PM Antwanette L wrote: Medication: ALPRAZolam  (XANAX ) 0.25 MG tablet  Has the patient contacted their pharmacy? Yes   This is the patient's preferred pharmacy:  [facility] - [place], [place] - [address] [facility] [address] [place] [address] Phone: [phone] Fax: [phone]    Is this the correct pharmacy for this prescription? Yes  Has the prescription been filled recently? Yes. Last refill was on 07/06/24  Is the patient out of the medication? No. The patient has 3 pills left  Has the patient been seen for an appointment in the last year OR does the patient have an upcoming appointment? Yes. Last ov with Dr. Dottie was on 06/15/24. This appt was a transfer of care   Can we respond through MyChart? No. Contact the patient by phone at [phone]  Agent: Please be advised that Rx refills may take up to 3 business days. We ask that you follow-up with your pharmacy.

## 2024-08-04 ENCOUNTER — Other Ambulatory Visit (HOSPITAL_BASED_OUTPATIENT_CLINIC_OR_DEPARTMENT_OTHER): Payer: Self-pay | Admitting: Family Medicine

## 2024-08-04 DIAGNOSIS — F419 Anxiety disorder, unspecified: Secondary | ICD-10-CM

## 2024-08-04 NOTE — Telephone Encounter (Signed)
 LMTCB

## 2024-08-05 ENCOUNTER — Other Ambulatory Visit (HOSPITAL_BASED_OUTPATIENT_CLINIC_OR_DEPARTMENT_OTHER): Payer: Self-pay | Admitting: Family Medicine

## 2024-08-05 DIAGNOSIS — F419 Anxiety disorder, unspecified: Secondary | ICD-10-CM

## 2024-08-05 MED ORDER — ALPRAZOLAM 0.25 MG PO TABS: 0.2500 mg | ORAL_TABLET | Freq: Three times a day (TID) | ORAL | 0 refills | Status: AC | PRN

## 2024-08-05 NOTE — Telephone Encounter (Signed)
 Pt advised and voices understanding.  Pt will take a shorter refill. She would like it send to [facility]. She stated she needs to call back to schedule with Dr. Dottie. She is declining a referral to Psych.

## 2024-08-05 NOTE — Telephone Encounter (Signed)
 Pt called back and she stated she cannot cut back to 1 tablet daily. She lost her husband and has been taking 3 tablets daily. She wanted this with mail order. Please advise. I can also schedule her a follow up once I call her back.

## 2024-08-06 ENCOUNTER — Ambulatory Visit: Payer: Self-pay

## 2024-08-06 ENCOUNTER — Encounter (HOSPITAL_COMMUNITY): Payer: Self-pay | Admitting: Emergency Medicine

## 2024-08-06 ENCOUNTER — Other Ambulatory Visit: Payer: Self-pay

## 2024-08-06 ENCOUNTER — Emergency Department (HOSPITAL_COMMUNITY)
Admission: EM | Admit: 2024-08-06 | Discharge: 2024-08-06 | Disposition: A | Discharge status: Home / Self Care | Attending: Emergency Medicine | Admitting: Emergency Medicine

## 2024-08-06 ENCOUNTER — Emergency Department (HOSPITAL_COMMUNITY)

## 2024-08-06 DIAGNOSIS — R944 Abnormal results of kidney function studies: Secondary | ICD-10-CM | POA: Diagnosis not present

## 2024-08-06 DIAGNOSIS — E860 Dehydration: Secondary | ICD-10-CM | POA: Diagnosis not present

## 2024-08-06 DIAGNOSIS — Z7982 Long term (current) use of aspirin: Secondary | ICD-10-CM | POA: Diagnosis not present

## 2024-08-06 DIAGNOSIS — E039 Hypothyroidism, unspecified: Secondary | ICD-10-CM | POA: Diagnosis not present

## 2024-08-06 DIAGNOSIS — R109 Unspecified abdominal pain: Secondary | ICD-10-CM | POA: Diagnosis not present

## 2024-08-06 DIAGNOSIS — R42 Dizziness and giddiness: Secondary | ICD-10-CM | POA: Diagnosis not present

## 2024-08-06 DIAGNOSIS — I10 Essential (primary) hypertension: Secondary | ICD-10-CM | POA: Diagnosis not present

## 2024-08-06 DIAGNOSIS — R1084 Generalized abdominal pain: Secondary | ICD-10-CM | POA: Diagnosis not present

## 2024-08-06 DIAGNOSIS — K449 Diaphragmatic hernia without obstruction or gangrene: Secondary | ICD-10-CM | POA: Diagnosis not present

## 2024-08-06 DIAGNOSIS — E878 Other disorders of electrolyte and fluid balance, not elsewhere classified: Secondary | ICD-10-CM | POA: Insufficient documentation

## 2024-08-06 DIAGNOSIS — K5730 Diverticulosis of large intestine without perforation or abscess without bleeding: Secondary | ICD-10-CM | POA: Diagnosis not present

## 2024-08-06 DIAGNOSIS — R748 Abnormal levels of other serum enzymes: Secondary | ICD-10-CM | POA: Insufficient documentation

## 2024-08-06 DIAGNOSIS — E871 Hypo-osmolality and hyponatremia: Secondary | ICD-10-CM | POA: Insufficient documentation

## 2024-08-06 DIAGNOSIS — R197 Diarrhea, unspecified: Secondary | ICD-10-CM | POA: Insufficient documentation

## 2024-08-06 DIAGNOSIS — Z87891 Personal history of nicotine dependence: Secondary | ICD-10-CM | POA: Diagnosis not present

## 2024-08-06 LAB — POC OCCULT BLOOD, ED: Fecal Occult Bld: NEGATIVE

## 2024-08-06 LAB — URINALYSIS, ROUTINE W REFLEX MICROSCOPIC
Bacteria, UA: NONE SEEN
Bilirubin Urine: NEGATIVE
Glucose, UA: NEGATIVE mg/dL
Ketones, ur: NEGATIVE mg/dL
Leukocytes,Ua: NEGATIVE
Nitrite: NEGATIVE
Protein, ur: NEGATIVE mg/dL
Specific Gravity, Urine: 1.014 (ref 1.005–1.030)
pH: 7 (ref 5.0–8.0)

## 2024-08-06 LAB — COMPREHENSIVE METABOLIC PANEL WITH GFR
ALT: 16 U/L (ref 0–44)
AST: 20 U/L (ref 15–41)
Albumin: 4.4 g/dL (ref 3.5–5.0)
Alkaline Phosphatase: 61 U/L (ref 38–126)
Anion gap: 9 (ref 5–15)
BUN: 34 mg/dL — ABNORMAL HIGH (ref 8–23)
CO2: 26 mmol/L (ref 22–32)
Calcium: 9.5 mg/dL (ref 8.9–10.3)
Chloride: 95 mmol/L — ABNORMAL LOW (ref 98–111)
Creatinine, Ser: 1.29 mg/dL — ABNORMAL HIGH (ref 0.44–1.00)
GFR, Estimated: 41 mL/min — ABNORMAL LOW (ref >60)
Glucose, Bld: 89 mg/dL (ref 70–99)
Potassium: 4.8 mmol/L (ref 3.5–5.1)
Sodium: 130 mmol/L — ABNORMAL LOW (ref 135–145)
Total Bilirubin: 0.5 mg/dL (ref 0.0–1.2)
Total Protein: 7.1 g/dL (ref 6.5–8.1)

## 2024-08-06 LAB — I-STAT CHEM 8, ED
BUN: 34 mg/dL — ABNORMAL HIGH (ref 8–23)
Calcium, Ion: 1.2 mmol/L (ref 1.15–1.40)
Chloride: 95 mmol/L — ABNORMAL LOW (ref 98–111)
Creatinine, Ser: 1.5 mg/dL — ABNORMAL HIGH (ref 0.44–1.00)
Glucose, Bld: 90 mg/dL (ref 70–99)
HCT: 40 % (ref 36.0–46.0)
Hemoglobin: 13.6 g/dL (ref 12.0–15.0)
Potassium: 4.7 mmol/L (ref 3.5–5.1)
Sodium: 130 mmol/L — ABNORMAL LOW (ref 135–145)
TCO2: 24 mmol/L (ref 22–32)

## 2024-08-06 LAB — CBC WITH DIFFERENTIAL/PLATELET
Abs Immature Granulocytes: 0.02 K/uL (ref 0.00–0.07)
Basophils Absolute: 0 K/uL (ref 0.0–0.1)
Basophils Relative: 1 %
Eosinophils Absolute: 0.1 K/uL (ref 0.0–0.5)
Eosinophils Relative: 1 %
HCT: 39.1 % (ref 36.0–46.0)
Hemoglobin: 13.1 g/dL (ref 12.0–15.0)
Immature Granulocytes: 0 %
Lymphocytes Relative: 16 %
Lymphs Abs: 1.2 K/uL (ref 0.7–4.0)
MCH: 30.8 pg (ref 26.0–34.0)
MCHC: 33.5 g/dL (ref 30.0–36.0)
MCV: 91.8 fL (ref 80.0–100.0)
Monocytes Absolute: 0.4 K/uL (ref 0.1–1.0)
Monocytes Relative: 5 %
Neutro Abs: 6 K/uL (ref 1.7–7.7)
Neutrophils Relative %: 77 %
Platelets: 204 K/uL (ref 150–400)
RBC: 4.26 MIL/uL (ref 3.87–5.11)
RDW: 13 % (ref 11.5–15.5)
WBC: 7.7 K/uL (ref 4.0–10.5)
nRBC: 0 % (ref 0.0–0.2)

## 2024-08-06 LAB — LIPASE, BLOOD: Lipase: 52 U/L — ABNORMAL HIGH (ref 11–51)

## 2024-08-06 MED ORDER — IOHEXOL 300 MG/ML  SOLN
100.0000 mL | Freq: Once | INTRAMUSCULAR | Status: AC | PRN
  Administered 2024-08-06: 80 mL via INTRAVENOUS

## 2024-08-06 MED ORDER — SODIUM CHLORIDE 0.9 % IV BOLUS
1000.0000 mL | Freq: Once | INTRAVENOUS | Status: AC
  Administered 2024-08-06: 1000 mL via INTRAVENOUS

## 2024-08-06 NOTE — ED Provider Notes (Signed)
 [facility] AT [facility] Provider Note   CSN: [id number] Arrival date & time: 08/06/24  1356     Patient presents with: Abdominal Pain and Diarrhea   [name] is a 83 y.o. female.   Patient with history of hypertension, hyperlipidemia presents today with complaints of abdominal pain, diarrhea. Reports that she has been having symptoms for the last month. Reports symptoms started right after her husband died. Reports originally she was having watery diarrhea 3 times a day, however now it is reduced to 2 times a day. Reports that her stools have been dark, denies hematochezia. She was taking pepto bismol when her symptoms started but has not since then. No nausea or vomiting, no fevers or chills. Reports some mild cramping abdominal pain particularly before she has diarrhea, no significant pain currently. Denies urinary symptoms, no history of abdominal surgeries. No sick contacts, no history of international travel or suspicious food or water intake. She called her doctor today to schedule an appointment and when she mentioned her stools were dark her doctor recommended she come here for evaluation. She denies history of previous GI bleed, is not on anticoagulation.   The history is provided by the patient. No language interpreter was used.  Abdominal Pain Associated symptoms: diarrhea   Diarrhea Associated symptoms: abdominal pain        Prior to Admission medications   Medication Sig Start Date End Date Taking? Authorizing Provider  ALPRAZolam  (XANAX ) 0.25 MG tablet TAKE 1 TABLET BY MOUTH UP TO 2 DAILY AS NEEDED FOR ANXIETY. 07/06/24   Rothfuss, Jacob T, PA-C  ALPRAZolam  (XANAX ) 0.25 MG tablet Take 1 tablet (0.25 mg total) by mouth 3 (three) times daily as needed (Sedation precaution and avoid alcohol). 08/05/24   Dottie Norleen PHEBE PONCE, MD  aspirin  81 MG EC tablet Take 1 tablet (81 mg total) by mouth daily. Swallow whole. 01/03/22   Danford, Lonni SQUIBB, MD  busPIRone  (BUSPAR ) 5 MG tablet Take 1 tablet (5 mg total) by mouth 3 (three) times daily. 12/08/23   Chandra Toribio POUR, MD  Cholecalciferol (VITAMIN D3 PO) Take 1 capsule by mouth daily.    [provider]  dicyclomine  (BENTYL ) 10 MG capsule Take 1 capsule (10 mg total) by mouth 4 (four) times daily -  before meals and at bedtime. 06/15/24   Dottie Norleen PHEBE PONCE, MD  ezetimibe  (ZETIA ) 10 MG tablet Take 1 tablet (10 mg total) by mouth daily. 03/16/24   Chandra Toribio POUR, MD  levothyroxine  (SYNTHROID ) 75 MCG tablet TAKE 1 TABLET EVERY DAY 12/08/23   Chandra Toribio POUR, MD  MAGNESIUM PO Take 1 tablet by mouth daily.    [provider]  meloxicam  (MOBIC ) 7.5 MG tablet Take 1-2 tablets (7.5-15 mg total) by mouth daily. Patient taking differently: Take 7.5-15 mg by mouth as needed. 04/16/24   Chandra Toribio POUR, MD  omeprazole  (PRILOSEC) 40 MG capsule Take 1 capsule (40 mg total) by mouth daily. Patient taking differently: Take 40 mg by mouth as needed. 12/08/23   Chandra Toribio POUR, MD  spironolactone  (ALDACTONE ) 25 MG tablet Take 0.5 tablets (12.5 mg total) by mouth 2 (two) times daily. 05/27/24   Chandra Toribio POUR, MD  valsartan -hydrochlorothiazide  (DIOVAN -HCT) 160-12.5 MG tablet Take 1 tablet by mouth daily. 02/26/24   Chandra Toribio POUR, MD  vitamin B-12 (CYANOCOBALAMIN ) 1000 MCG tablet Take 1,000 mcg by mouth daily.    [provider]    Allergies: Statins, Shellfish allergy, and  Simvastatin     Review of Systems  Gastrointestinal:  Positive for abdominal pain and diarrhea.  All other systems reviewed and are negative.   Updated Vital Signs BP (!) 170/84 (BP Location: Left Arm)   Pulse 75   Temp 98.9 F (37.2 C) (Oral)   Resp 17   LMP  (LMP Unknown)   SpO2 100%   Physical Exam Vitals and nursing note reviewed. Exam conducted with a chaperone present.  Constitutional:      General: She is not in acute distress.    Appearance: Normal appearance. She is normal weight. She is not  ill-appearing, toxic-appearing or diaphoretic.  HENT:     Head: Normocephalic and atraumatic.  Cardiovascular:     Rate and Rhythm: Normal rate.  Pulmonary:     Effort: Pulmonary effort is normal. No respiratory distress.  Abdominal:     General: Abdomen is flat.     Palpations: Abdomen is soft.     Tenderness: There is no abdominal tenderness.  Genitourinary:    Comments: Dark brown stool present in the rectum. Musculoskeletal:        General: Normal range of motion.     Cervical back: Normal range of motion.  Skin:    General: Skin is warm and dry.  Neurological:     General: No focal deficit present.     Mental Status: She is alert.  Psychiatric:        Mood and Affect: Mood normal.        Behavior: Behavior normal.     (all labs ordered are listed, but only abnormal results are displayed) Labs Reviewed  COMPREHENSIVE METABOLIC PANEL WITH GFR - Abnormal; Notable for the following components:      Result Value   Sodium 130 (*)    Chloride 95 (*)    BUN 34 (*)    Creatinine, Ser 1.29 (*)    GFR, Estimated 41 (*)    All other components within normal limits  LIPASE, BLOOD - Abnormal; Notable for the following components:   Lipase 52 (*)    All other components within normal limits  URINALYSIS, ROUTINE W REFLEX MICROSCOPIC - Abnormal; Notable for the following components:   Hgb urine dipstick MODERATE (*)    All other components within normal limits  I-STAT CHEM 8, ED - Abnormal; Notable for the following components:   Sodium 130 (*)    Chloride 95 (*)    BUN 34 (*)    Creatinine, Ser 1.50 (*)    All other components within normal limits  GASTROINTESTINAL PANEL BY PCR, STOOL (REPLACES STOOL CULTURE)  C DIFFICILE QUICK SCREEN W PCR REFLEX    CBC WITH DIFFERENTIAL/PLATELET  POC OCCULT BLOOD, ED    EKG: None  Radiology: CT ABDOMEN PELVIS W CONTRAST Result Date: 08/06/2024 CLINICAL DATA:  Acute abdominal pain.  Diarrhea for 1 month. EXAM: CT ABDOMEN AND PELVIS  WITH CONTRAST TECHNIQUE: Multidetector CT imaging of the abdomen and pelvis was performed using the standard protocol following bolus administration of intravenous contrast. RADIATION DOSE REDUCTION: This exam was performed according to the departmental dose-optimization program which includes automated exposure control, adjustment of the mA and/or kV according to patient size and/or use of iterative reconstruction technique. CONTRAST:  80mL OMNIPAQUE  IOHEXOL  300 MG/ML  SOLN COMPARISON:  CT 08/13/2018 FINDINGS: Lower chest: No acute findings. Hepatobiliary: No focal liver abnormality is seen. No gallstones, gallbladder wall thickening, or biliary dilatation. Pancreas: Unremarkable. No pancreatic ductal dilatation or surrounding inflammatory changes. Spleen:  Normal in size without focal abnormality. Adrenals/Urinary Tract: No adrenal nodule. Mild renal parenchymal atrophy. No hydronephrosis, renal calculi or suspicious renal lesion. Homogeneous renal enhancement. No bladder wall thickening. Stomach/Bowel: Tiny hiatal hernia. The stomach is nondistended. Scattered fluid-filled small bowel loops in the pelvis without wall thickening or inflammation. The cecum remains on the long mesentery and is present in the pelvis just to the left of midline, unchanged from prior. Remainder of the colon is redundant. Moderate formed stool within the ascending and transverse colon. Small volume of formed stool within the descending and sigmoid colon. Scattered left colonic diverticula without diverticulitis. Chain sutures in the rectum. No colonic wall thickening or inflammation. Appendectomy. Vascular/Lymphatic: Aortic atherosclerosis. No aortic aneurysm. The portal and splenic veins are patent. No suspicious lymphadenopathy. Reproductive: Hysterectomy. There is fluid distending the vaginal cuff, a chronic finding. Other: No free air, free fluid, or intra-abdominal fluid collection. No abdominal wall hernia. Minimal fat in the left  inguinal canal. Musculoskeletal: Mild scoliosis and degenerative change in the spine. There are no acute or suspicious osseous abnormalities. IMPRESSION: 1. No acute abnormality in the abdomen/pelvis. 2. Colonic diverticulosis without diverticulitis. Aortic Atherosclerosis (ICD10-I70.0). Electronically Signed   By: Andrea Gasman M.D.   On: 08/06/2024 16:33     Procedures   Medications Ordered in the ED  sodium chloride  0.9 % bolus 1,000 mL (1,000 mLs Intravenous New Bag/Given 08/06/24 1621)  iohexol  (OMNIPAQUE ) 300 MG/ML solution 100 mL (80 mLs Intravenous Contrast Given 08/06/24 1546)                                    Medical Decision Making Amount and/or Complexity of Data Reviewed Labs: ordered. Radiology: ordered.  Risk Prescription drug management.   This patient is a 83 y.o. female who presents to the ED for concern of abdominal pain, diarrhea, this involves an extensive number of treatment options, and is a complaint that carries with it a high risk of complications and morbidity. The emergent differential diagnosis prior to evaluation includes, but is not limited to, infectious diarrhea, GI bleed, diverticulitis, IBD, colitis, colorectal cancer / polyps, hemorrhoids, AAA, gastroenteritis, appendicitis, Bowel obstruction, Bowel perforation. Gastroparesis, DKA, Hernia, Inflammatory bowel disease, mesenteric ischemia, pancreatitis, peritonitis SBP, volvulus.   This is not an exhaustive differential.   Past Medical History / Co-morbidities / Social History:  has a past medical history of Anxiety, Former smoker, Hyperlipidemia, Hypertension, Hypothyroid, Osteoarthritis, and Pernicious anemia.  Additional history: Chart reviewed. Pertinent results include: telephone note present from patients pcp today describing symptoms and recommendation for going to the ER for evaluation  Physical Exam: Physical exam performed. The pertinent findings include: Well-appearing, abdomen soft and  nontender, dark brown stool present in the rectal vault.  Lab Tests: I ordered, and personally interpreted labs.  The pertinent results include: No leukocytosis, hemoglobin stable at 13.1.  Hemoccult negative. Lipase 52, Na 130, chloride 95, BUN 34, creatinine 1.29. UA noninfectious   Imaging Studies: I ordered imaging studies including CT abdomen pelvis. I independently visualized and interpreted imaging which showed   1. No acute abnormality in the abdomen/pelvis. 2. Colonic diverticulosis without diverticulitis.   Aortic Atherosclerosis  I agree with the radiologist interpretation.   Medications: I ordered medication including IV fluids  for dehydration/diarrhea. Reevaluation of the patient after these medicines showed that the patient improved. I have reviewed the patients home medicines and have made adjustments as needed.  Disposition: After consideration of the diagnostic results and the patients response to treatment, I feel that emergency department workup does not suggest an emergent condition requiring admission or immediate intervention beyond what has been performed at this time. The plan is: discharge with close outpatient follow-up and return precautions. Patients hemoccult is negative and her hemoglobin is stable, therefore doubt GI bleed. She does appear dehydrated, I did offer admission for this, however she is able to tolerate po and wants to go home. Emphasized importance of maintaining adequate oral hydration with fluids with electrolytes as well is increasing her salt intake. Recommend she call her pcp to schedule close follow-up appointment. Evaluation and diagnostic testing in the emergency department does not suggest an emergent condition requiring admission or immediate intervention beyond what has been performed at this time.  Plan for discharge with close PCP follow-up.  Patient is understanding and amenable with plan, educated on red flag symptoms that would prompt  immediate return.  Patient discharged in stable condition.   I discussed this case with my attending physician Dr. Laurice who cosigned this note including patient's presenting symptoms, physical exam, and planned diagnostics and interventions. Attending physician stated agreement with plan or made changes to plan which were implemented.    Final diagnoses:  Diarrhea, unspecified type  Dehydration with hyponatremia    ED Discharge Orders     None     An After Visit Summary was printed and given to the patient.      Rithik Odea A, PA-C 08/06/24 1750    Laurice Maude BROCKS, MD 08/06/24 2153

## 2024-08-06 NOTE — Telephone Encounter (Signed)
 FYI Only or Action Required?: FYI only for provider: ED advised.  Patient was last seen in primary care on 06/15/2024 by Vicki Norleen PHEBE PONCE, MD.  Called Nurse Triage reporting Abdominal Pain and Blood In Stools.  Symptoms began about a month ago.  Interventions attempted: Nothing.  Symptoms are: unchanged.  Triage Disposition: Go to ED Now (Notify PCP)  Patient/caregiver understands and will follow disposition?: Yes                               1. APPEARANCE of BLOOD: What color is it? Is it passed separately, on the surface of the stool, or mixed in with the stool?      States stools are completely black, denies seeing blood mixed in or being passed separately because the stools are so dark 4. ONSET: When was the blood first seen in the stools? (Days or weeks)      Ongoing for a month 5. DIARRHEA: Is there also some diarrhea? If Yes, ask: How many diarrhea stools in the past 24 hours?      Yes, states stools are thin/diarrhea 8. BLOOD THINNERS: Do you take any blood thinners? (e.g., aspirin , clopidogrel  / Plavix , coumadin, heparin). Notes: Other strong blood thinners include: Arixtra (fondaparinux), Eliquis (apixaban), Pradaxa (dabigatran), and Xarelto (rivaroxaban).     Aspirin  9. OTHER SYMPTOMS: Do you have any other symptoms?  (e.g., abdomen pain, vomiting, dizziness, fever)     Abdominal pain, weakness, dizziness, foul smelling stools   This RN advised ED. Patient stated she would ask her neighbor to take her. Patient declined EMS at this time. Care advice provided as documented.   Copied from CRM [id number]. Topic: Clinical - Red Word Triage >> Aug 06, 2024 11:34 AM [place] MATSU wrote: Red Word that prompted transfer to Nurse Triage: painful stomach pains, black stool  Reason for Disposition  Black or tarry bowel movements  (Exception: Chronic-unchanged black-grey BMs AND is taking iron pills or Pepto-Bismol.)  Protocols used: Rectal  Bleeding-A-AH

## 2024-08-06 NOTE — Discharge Instructions (Addendum)
 It was a pleasure taking part in your care this evening!  As we discussed, your workup in the ER today was overall reassuring for acute findings.  Laboratory evaluation and CT imaging did not reveal any emergent cause of your symptoms.  However, it does look like you are mildly dehydrated.  We have given you a liter of IV fluids today which should help with this.  Please be sure to continue with oral hydration at home specifically with fluids as well as electrolytes.  Your sodium was a little low as well so I recommend increasing your salt intake.  He can do this by eating a few saltine crackers every day.  Please call your primary care provider at your earliest convenience to schedule a close follow-up appointment to have these labs redrawn to ensure that they are improving.  You also need to have the cause of your diarrhea further evaluated.  You can try some Imodium which you can get at your local pharmacy over-the-counter to help reduce the volume of your diarrhea.  Return if development of any new or worsening symptoms.

## 2024-08-06 NOTE — ED Triage Notes (Signed)
 Pt BIB EMS from home, c/o diarrhea and increased weakness since husband passed away. Pain radiates to LLQ, 510 pain. Denies N/V  BP 166/72 P 75 SpO2 99% CBG 104

## 2024-08-19 ENCOUNTER — Other Ambulatory Visit: Payer: Self-pay | Admitting: Family Medicine

## 2024-09-21 ENCOUNTER — Telehealth (HOSPITAL_BASED_OUTPATIENT_CLINIC_OR_DEPARTMENT_OTHER): Payer: Self-pay | Admitting: Family Medicine

## 2024-09-21 NOTE — Telephone Encounter (Signed)
 Copied from CRM #[id number]. Topic: Appointments - Transfer of Care >> Sep 21, 2024 11:44 AM Delon DASEN wrote: Pt is requesting to transfer FROM: Dr Dottie  Pt is requesting to transfer TO: Dr Katrinka Reason for requested transfer: does not like Dr Dottie It is the responsibility of the team the patient would like to transfer to (Dr. Katrinka) to reach out to the patient if for any reason this transfer is not acceptable.    FYI Patient is transferring care.

## 2024-10-15 ENCOUNTER — Ambulatory Visit: Admitting: Family Medicine
